# Patient Record
Sex: Female | Born: 1950 | Race: White | Hispanic: No | Marital: Married | Smoking: Former smoker
Health system: Southern US, Community
[De-identification: ages and names within clinical notes are randomized; demographics above are authoritative.]

## PROBLEM LIST (undated history)

## (undated) DIAGNOSIS — I639 Cerebral infarction, unspecified: Secondary | ICD-10-CM

## (undated) DIAGNOSIS — K219 Gastro-esophageal reflux disease without esophagitis: Secondary | ICD-10-CM

## (undated) DIAGNOSIS — I872 Venous insufficiency (chronic) (peripheral): Secondary | ICD-10-CM

## (undated) DIAGNOSIS — K5289 Other specified noninfective gastroenteritis and colitis: Secondary | ICD-10-CM

## (undated) DIAGNOSIS — J45991 Cough variant asthma: Secondary | ICD-10-CM

## (undated) DIAGNOSIS — I671 Cerebral aneurysm, nonruptured: Secondary | ICD-10-CM

## (undated) DIAGNOSIS — R0789 Other chest pain: Secondary | ICD-10-CM

## (undated) DIAGNOSIS — F32A Depression, unspecified: Secondary | ICD-10-CM

## (undated) DIAGNOSIS — S82852A Displaced trimalleolar fracture of left lower leg, initial encounter for closed fracture: Secondary | ICD-10-CM

## (undated) DIAGNOSIS — R42 Dizziness and giddiness: Secondary | ICD-10-CM

## (undated) DIAGNOSIS — K602 Anal fissure, unspecified: Secondary | ICD-10-CM

## (undated) DIAGNOSIS — K6389 Other specified diseases of intestine: Secondary | ICD-10-CM

## (undated) DIAGNOSIS — F329 Major depressive disorder, single episode, unspecified: Secondary | ICD-10-CM

## (undated) DIAGNOSIS — I10 Essential (primary) hypertension: Secondary | ICD-10-CM

## (undated) DIAGNOSIS — IMO0001 Reserved for inherently not codable concepts without codable children: Secondary | ICD-10-CM

## (undated) DIAGNOSIS — K449 Diaphragmatic hernia without obstruction or gangrene: Secondary | ICD-10-CM

## (undated) DIAGNOSIS — S9305XA Dislocation of left ankle joint, initial encounter: Secondary | ICD-10-CM

## (undated) DIAGNOSIS — I619 Nontraumatic intracerebral hemorrhage, unspecified: Secondary | ICD-10-CM

## (undated) DIAGNOSIS — K5900 Constipation, unspecified: Secondary | ICD-10-CM

## (undated) DIAGNOSIS — G4730 Sleep apnea, unspecified: Secondary | ICD-10-CM

## (undated) DIAGNOSIS — G43009 Migraine without aura, not intractable, without status migrainosus: Secondary | ICD-10-CM

## (undated) DIAGNOSIS — R209 Unspecified disturbances of skin sensation: Secondary | ICD-10-CM

## (undated) DIAGNOSIS — E785 Hyperlipidemia, unspecified: Secondary | ICD-10-CM

## (undated) DIAGNOSIS — Z87442 Personal history of urinary calculi: Secondary | ICD-10-CM

## (undated) DIAGNOSIS — M8580 Other specified disorders of bone density and structure, unspecified site: Secondary | ICD-10-CM

## (undated) DIAGNOSIS — K5090 Crohn's disease, unspecified, without complications: Secondary | ICD-10-CM

## (undated) DIAGNOSIS — M5030 Other cervical disc degeneration, unspecified cervical region: Secondary | ICD-10-CM

## (undated) DIAGNOSIS — G5600 Carpal tunnel syndrome, unspecified upper limb: Secondary | ICD-10-CM

## (undated) DIAGNOSIS — K8020 Calculus of gallbladder without cholecystitis without obstruction: Secondary | ICD-10-CM

## (undated) DIAGNOSIS — IMO0002 Reserved for concepts with insufficient information to code with codable children: Secondary | ICD-10-CM

## (undated) DIAGNOSIS — R197 Diarrhea, unspecified: Secondary | ICD-10-CM

## (undated) DIAGNOSIS — N6019 Diffuse cystic mastopathy of unspecified breast: Secondary | ICD-10-CM

## (undated) DIAGNOSIS — G4700 Insomnia, unspecified: Secondary | ICD-10-CM

## (undated) HISTORY — DX: Gastro-esophageal reflux disease without esophagitis: K21.9

## (undated) HISTORY — DX: Reserved for concepts with insufficient information to code with codable children: IMO0002

## (undated) HISTORY — DX: Other cervical disc degeneration, unspecified cervical region: M50.30

## (undated) HISTORY — DX: Dizziness and giddiness: R42

## (undated) HISTORY — DX: Other specified noninfective gastroenteritis and colitis: K52.89

## (undated) HISTORY — DX: Migraine without aura, not intractable, without status migrainosus: G43.009

## (undated) HISTORY — DX: Cerebral aneurysm, nonruptured: I67.1

## (undated) HISTORY — DX: Unspecified disturbances of skin sensation: R20.9

## (undated) HISTORY — DX: Diffuse cystic mastopathy of unspecified breast: N60.19

## (undated) HISTORY — DX: Other specified diseases of intestine: K63.89

## (undated) HISTORY — DX: Carpal tunnel syndrome, unspecified upper limb: G56.00

## (undated) HISTORY — DX: Crohn's disease, unspecified, without complications: K50.90

## (undated) HISTORY — PX: BREAST LUMPECTOMY: SHX2

## (undated) HISTORY — DX: Sleep apnea, unspecified: G47.30

## (undated) HISTORY — DX: Venous insufficiency (chronic) (peripheral): I87.2

## (undated) HISTORY — DX: Nontraumatic intracerebral hemorrhage, unspecified: I61.9

## (undated) HISTORY — DX: Hyperlipidemia, unspecified: E78.5

## (undated) HISTORY — DX: Other chest pain: R07.89

## (undated) HISTORY — DX: Cough variant asthma: J45.991

## (undated) HISTORY — PX: ABDOMINAL HYSTERECTOMY: SHX81

## (undated) HISTORY — PX: COLONOSCOPY: SHX174

## (undated) HISTORY — DX: Essential (primary) hypertension: I10

## (undated) HISTORY — DX: Diaphragmatic hernia without obstruction or gangrene: K44.9

## (undated) HISTORY — PX: CARPAL TUNNEL RELEASE: SHX101

## (undated) HISTORY — DX: Calculus of gallbladder without cholecystitis without obstruction: K80.20

## (undated) HISTORY — DX: Insomnia, unspecified: G47.00

## (undated) HISTORY — PX: TONSILLECTOMY: SUR1361

## (undated) HISTORY — DX: Constipation, unspecified: K59.00

## (undated) HISTORY — PX: EYE SURGERY: SHX253

## (undated) HISTORY — DX: Anal fissure, unspecified: K60.2

## (undated) HISTORY — DX: Diarrhea, unspecified: R19.7

---

## 1979-09-12 HISTORY — PX: COLECTOMY: SHX59

## 1998-07-20 ENCOUNTER — Ambulatory Visit (HOSPITAL_COMMUNITY): Admission: RE | Admit: 1998-07-20 | Discharge: 1998-07-20 | Payer: Self-pay | Admitting: Obstetrics and Gynecology

## 1998-12-07 ENCOUNTER — Ambulatory Visit (HOSPITAL_COMMUNITY): Admission: RE | Admit: 1998-12-07 | Discharge: 1998-12-07 | Payer: Self-pay | Admitting: Gastroenterology

## 1998-12-07 ENCOUNTER — Encounter: Payer: Self-pay | Admitting: Gastroenterology

## 1999-01-10 ENCOUNTER — Other Ambulatory Visit: Admission: RE | Admit: 1999-01-10 | Discharge: 1999-01-10 | Payer: Self-pay | Admitting: Gastroenterology

## 1999-01-12 ENCOUNTER — Other Ambulatory Visit: Admission: RE | Admit: 1999-01-12 | Discharge: 1999-01-12 | Payer: Self-pay | Admitting: Obstetrics and Gynecology

## 1999-07-04 ENCOUNTER — Other Ambulatory Visit: Admission: RE | Admit: 1999-07-04 | Discharge: 1999-07-04 | Payer: Self-pay | Admitting: Obstetrics and Gynecology

## 1999-07-19 ENCOUNTER — Encounter: Payer: Self-pay | Admitting: Obstetrics and Gynecology

## 1999-07-19 ENCOUNTER — Encounter: Admission: RE | Admit: 1999-07-19 | Discharge: 1999-07-19 | Payer: Self-pay | Admitting: Obstetrics and Gynecology

## 2000-07-12 ENCOUNTER — Other Ambulatory Visit: Admission: RE | Admit: 2000-07-12 | Discharge: 2000-07-12 | Payer: Self-pay | Admitting: Obstetrics and Gynecology

## 2000-07-19 ENCOUNTER — Encounter: Payer: Self-pay | Admitting: Obstetrics and Gynecology

## 2000-07-19 ENCOUNTER — Encounter: Admission: RE | Admit: 2000-07-19 | Discharge: 2000-07-19 | Payer: Self-pay | Admitting: Obstetrics and Gynecology

## 2001-07-15 ENCOUNTER — Other Ambulatory Visit: Admission: RE | Admit: 2001-07-15 | Discharge: 2001-07-15 | Payer: Self-pay | Admitting: Obstetrics and Gynecology

## 2001-07-30 ENCOUNTER — Encounter: Payer: Self-pay | Admitting: Obstetrics and Gynecology

## 2001-07-30 ENCOUNTER — Encounter: Admission: RE | Admit: 2001-07-30 | Discharge: 2001-07-30 | Payer: Self-pay | Admitting: Obstetrics and Gynecology

## 2001-08-01 ENCOUNTER — Encounter: Admission: RE | Admit: 2001-08-01 | Discharge: 2001-08-01 | Payer: Self-pay | Admitting: Obstetrics and Gynecology

## 2001-08-01 ENCOUNTER — Encounter: Payer: Self-pay | Admitting: Obstetrics and Gynecology

## 2002-08-27 ENCOUNTER — Encounter: Payer: Self-pay | Admitting: Obstetrics and Gynecology

## 2002-08-27 ENCOUNTER — Encounter: Admission: RE | Admit: 2002-08-27 | Discharge: 2002-08-27 | Payer: Self-pay | Admitting: Obstetrics and Gynecology

## 2003-03-25 ENCOUNTER — Ambulatory Visit (HOSPITAL_BASED_OUTPATIENT_CLINIC_OR_DEPARTMENT_OTHER): Admission: RE | Admit: 2003-03-25 | Discharge: 2003-03-25 | Payer: Self-pay | Admitting: Internal Medicine

## 2003-05-27 ENCOUNTER — Encounter: Payer: Self-pay | Admitting: Internal Medicine

## 2003-05-27 ENCOUNTER — Encounter: Payer: Self-pay | Admitting: Gastroenterology

## 2003-10-06 ENCOUNTER — Encounter: Admission: RE | Admit: 2003-10-06 | Discharge: 2003-10-06 | Payer: Self-pay | Admitting: Internal Medicine

## 2003-11-10 ENCOUNTER — Encounter: Admission: RE | Admit: 2003-11-10 | Discharge: 2003-11-10 | Payer: Self-pay | Admitting: Obstetrics and Gynecology

## 2003-11-25 ENCOUNTER — Encounter: Payer: Self-pay | Admitting: Gastroenterology

## 2003-11-25 ENCOUNTER — Encounter: Payer: Self-pay | Admitting: Internal Medicine

## 2004-01-06 ENCOUNTER — Encounter: Admission: RE | Admit: 2004-01-06 | Discharge: 2004-01-06 | Payer: Self-pay | Admitting: Obstetrics and Gynecology

## 2004-06-16 ENCOUNTER — Encounter: Admission: RE | Admit: 2004-06-16 | Discharge: 2004-06-16 | Payer: Self-pay | Admitting: Internal Medicine

## 2004-07-12 ENCOUNTER — Ambulatory Visit: Payer: Self-pay | Admitting: Gastroenterology

## 2004-08-16 ENCOUNTER — Ambulatory Visit: Payer: Self-pay | Admitting: Gastroenterology

## 2004-09-27 ENCOUNTER — Ambulatory Visit: Payer: Self-pay | Admitting: Internal Medicine

## 2004-11-10 ENCOUNTER — Encounter: Admission: RE | Admit: 2004-11-10 | Discharge: 2004-11-10 | Payer: Self-pay | Admitting: Internal Medicine

## 2004-11-15 ENCOUNTER — Ambulatory Visit: Payer: Self-pay | Admitting: Gastroenterology

## 2005-02-21 ENCOUNTER — Ambulatory Visit: Payer: Self-pay | Admitting: Gastroenterology

## 2005-03-21 ENCOUNTER — Ambulatory Visit: Payer: Self-pay | Admitting: Internal Medicine

## 2005-03-28 ENCOUNTER — Other Ambulatory Visit: Admission: RE | Admit: 2005-03-28 | Discharge: 2005-03-28 | Payer: Self-pay | Admitting: Internal Medicine

## 2005-03-28 ENCOUNTER — Ambulatory Visit: Payer: Self-pay | Admitting: Internal Medicine

## 2005-04-19 ENCOUNTER — Ambulatory Visit: Payer: Self-pay | Admitting: Internal Medicine

## 2005-05-02 ENCOUNTER — Ambulatory Visit: Payer: Self-pay | Admitting: Internal Medicine

## 2005-05-23 ENCOUNTER — Ambulatory Visit: Payer: Self-pay | Admitting: Internal Medicine

## 2005-08-21 ENCOUNTER — Ambulatory Visit: Payer: Self-pay | Admitting: Internal Medicine

## 2005-09-26 ENCOUNTER — Ambulatory Visit: Payer: Self-pay | Admitting: Gastroenterology

## 2005-11-29 ENCOUNTER — Encounter: Admission: RE | Admit: 2005-11-29 | Discharge: 2005-11-29 | Payer: Self-pay | Admitting: Internal Medicine

## 2006-04-02 ENCOUNTER — Ambulatory Visit: Payer: Self-pay | Admitting: Internal Medicine

## 2006-04-09 ENCOUNTER — Ambulatory Visit: Payer: Self-pay | Admitting: Internal Medicine

## 2006-06-12 ENCOUNTER — Ambulatory Visit: Payer: Self-pay | Admitting: Internal Medicine

## 2006-06-19 ENCOUNTER — Inpatient Hospital Stay (HOSPITAL_COMMUNITY): Admission: AD | Admit: 2006-06-19 | Discharge: 2006-06-26 | Payer: Self-pay | Admitting: Neurosurgery

## 2006-06-19 ENCOUNTER — Encounter: Payer: Self-pay | Admitting: Emergency Medicine

## 2006-06-20 ENCOUNTER — Encounter: Payer: Self-pay | Admitting: Internal Medicine

## 2006-06-20 ENCOUNTER — Encounter (INDEPENDENT_AMBULATORY_CARE_PROVIDER_SITE_OTHER): Payer: Self-pay | Admitting: *Deleted

## 2006-06-20 ENCOUNTER — Ambulatory Visit: Payer: Self-pay | Admitting: Internal Medicine

## 2006-06-21 ENCOUNTER — Ambulatory Visit: Payer: Self-pay | Admitting: Physical Medicine & Rehabilitation

## 2006-07-09 ENCOUNTER — Encounter: Admission: RE | Admit: 2006-07-09 | Discharge: 2006-08-30 | Payer: Self-pay | Admitting: Neurology

## 2006-07-10 ENCOUNTER — Ambulatory Visit: Payer: Self-pay | Admitting: Internal Medicine

## 2006-07-30 ENCOUNTER — Ambulatory Visit: Payer: Self-pay | Admitting: Internal Medicine

## 2006-08-16 ENCOUNTER — Ambulatory Visit (HOSPITAL_COMMUNITY): Admission: RE | Admit: 2006-08-16 | Discharge: 2006-08-16 | Payer: Self-pay | Admitting: Unknown Physician Specialty

## 2006-08-21 ENCOUNTER — Encounter: Payer: Self-pay | Admitting: Internal Medicine

## 2006-08-21 ENCOUNTER — Ambulatory Visit: Payer: Self-pay

## 2006-09-07 ENCOUNTER — Ambulatory Visit: Payer: Self-pay | Admitting: Internal Medicine

## 2006-10-03 ENCOUNTER — Ambulatory Visit: Payer: Self-pay | Admitting: Internal Medicine

## 2006-10-30 ENCOUNTER — Ambulatory Visit: Payer: Self-pay | Admitting: Licensed Clinical Social Worker

## 2006-11-12 ENCOUNTER — Ambulatory Visit: Payer: Self-pay | Admitting: Internal Medicine

## 2006-11-28 ENCOUNTER — Encounter: Admission: RE | Admit: 2006-11-28 | Discharge: 2006-11-28 | Payer: Self-pay | Admitting: Internal Medicine

## 2006-12-04 ENCOUNTER — Ambulatory Visit: Payer: Self-pay | Admitting: Gastroenterology

## 2006-12-04 LAB — CONVERTED CEMR LAB
ALT: 32 units/L (ref 0–40)
BUN: 14 mg/dL (ref 6–23)
Basophils Absolute: 0 10*3/uL (ref 0.0–0.1)
Bilirubin, Direct: 0.1 mg/dL (ref 0.0–0.3)
CO2: 28 meq/L (ref 19–32)
Calcium: 9.6 mg/dL (ref 8.4–10.5)
Chloride: 110 meq/L (ref 96–112)
GFR calc non Af Amer: 69 mL/min
Glucose, Bld: 92 mg/dL (ref 70–99)
HCT: 44.2 % (ref 36.0–46.0)
Hemoglobin: 14.4 g/dL (ref 12.0–15.0)
Lymphocytes Relative: 43.8 % (ref 12.0–46.0)
MCHC: 32.6 g/dL (ref 30.0–36.0)
Monocytes Absolute: 0.6 10*3/uL (ref 0.2–0.7)
Monocytes Relative: 10.7 % (ref 3.0–11.0)
Neutro Abs: 2.5 10*3/uL (ref 1.4–7.7)
Neutrophils Relative %: 43.7 % (ref 43.0–77.0)
Potassium: 4.1 meq/L (ref 3.5–5.1)
Sed Rate: 10 mm/hr (ref 0–25)
Total Protein: 6.9 g/dL (ref 6.0–8.3)
Vitamin B-12: 470 pg/mL (ref 211–911)

## 2006-12-05 ENCOUNTER — Encounter: Payer: Self-pay | Admitting: Gastroenterology

## 2006-12-05 ENCOUNTER — Ambulatory Visit: Payer: Self-pay | Admitting: Internal Medicine

## 2006-12-13 ENCOUNTER — Ambulatory Visit: Payer: Self-pay | Admitting: Gastroenterology

## 2007-01-22 ENCOUNTER — Encounter: Admission: RE | Admit: 2007-01-22 | Discharge: 2007-01-22 | Payer: Self-pay | Admitting: Internal Medicine

## 2007-02-12 ENCOUNTER — Ambulatory Visit: Payer: Self-pay | Admitting: Internal Medicine

## 2007-02-13 ENCOUNTER — Encounter: Payer: Self-pay | Admitting: Internal Medicine

## 2007-02-19 ENCOUNTER — Ambulatory Visit: Payer: Self-pay

## 2007-03-11 ENCOUNTER — Telehealth (INDEPENDENT_AMBULATORY_CARE_PROVIDER_SITE_OTHER): Payer: Self-pay | Admitting: *Deleted

## 2007-03-19 ENCOUNTER — Ambulatory Visit: Payer: Self-pay | Admitting: Internal Medicine

## 2007-04-03 ENCOUNTER — Encounter: Payer: Self-pay | Admitting: Internal Medicine

## 2007-04-04 ENCOUNTER — Telehealth: Payer: Self-pay | Admitting: Internal Medicine

## 2007-04-05 ENCOUNTER — Ambulatory Visit: Payer: Self-pay | Admitting: Internal Medicine

## 2007-04-05 DIAGNOSIS — K219 Gastro-esophageal reflux disease without esophagitis: Secondary | ICD-10-CM | POA: Insufficient documentation

## 2007-04-05 DIAGNOSIS — I10 Essential (primary) hypertension: Secondary | ICD-10-CM | POA: Insufficient documentation

## 2007-04-05 DIAGNOSIS — D869 Sarcoidosis, unspecified: Secondary | ICD-10-CM | POA: Insufficient documentation

## 2007-04-05 DIAGNOSIS — G43909 Migraine, unspecified, not intractable, without status migrainosus: Secondary | ICD-10-CM | POA: Insufficient documentation

## 2007-04-05 DIAGNOSIS — G5600 Carpal tunnel syndrome, unspecified upper limb: Secondary | ICD-10-CM | POA: Insufficient documentation

## 2007-04-05 DIAGNOSIS — K5090 Crohn's disease, unspecified, without complications: Secondary | ICD-10-CM | POA: Insufficient documentation

## 2007-04-05 DIAGNOSIS — K8020 Calculus of gallbladder without cholecystitis without obstruction: Secondary | ICD-10-CM | POA: Insufficient documentation

## 2007-04-05 DIAGNOSIS — G4730 Sleep apnea, unspecified: Secondary | ICD-10-CM | POA: Insufficient documentation

## 2007-04-18 ENCOUNTER — Telehealth: Payer: Self-pay | Admitting: *Deleted

## 2007-04-19 ENCOUNTER — Telehealth: Payer: Self-pay | Admitting: Internal Medicine

## 2007-05-02 ENCOUNTER — Ambulatory Visit: Payer: Self-pay | Admitting: Internal Medicine

## 2007-05-30 ENCOUNTER — Emergency Department (HOSPITAL_COMMUNITY): Admission: EM | Admit: 2007-05-30 | Discharge: 2007-05-31 | Payer: Self-pay | Admitting: Emergency Medicine

## 2007-06-04 ENCOUNTER — Ambulatory Visit: Payer: Self-pay | Admitting: Internal Medicine

## 2007-07-08 ENCOUNTER — Ambulatory Visit: Payer: Self-pay | Admitting: Internal Medicine

## 2007-07-23 ENCOUNTER — Telehealth: Payer: Self-pay | Admitting: Internal Medicine

## 2007-09-24 ENCOUNTER — Ambulatory Visit: Payer: Self-pay | Admitting: Internal Medicine

## 2007-09-24 LAB — CONVERTED CEMR LAB
ALT: 22 units/L (ref 0–35)
AST: 22 units/L (ref 0–37)
Basophils Relative: 0.4 % (ref 0.0–1.0)
Bilirubin Urine: NEGATIVE
Bilirubin, Direct: 0.1 mg/dL (ref 0.0–0.3)
Blood in Urine, dipstick: NEGATIVE
Calcium: 9.5 mg/dL (ref 8.4–10.5)
Chloride: 108 meq/L (ref 96–112)
Creatinine, Ser: 0.9 mg/dL (ref 0.4–1.2)
Eosinophils Absolute: 0.1 10*3/uL (ref 0.0–0.6)
Eosinophils Relative: 1.8 % (ref 0.0–5.0)
GFR calc non Af Amer: 68 mL/min
Glucose, Bld: 84 mg/dL (ref 70–99)
HCT: 42.2 % (ref 36.0–46.0)
Ketones, urine, test strip: NEGATIVE
LDL Cholesterol: 120 mg/dL — ABNORMAL HIGH (ref 0–99)
MCV: 90.2 fL (ref 78.0–100.0)
Neutrophils Relative %: 46 % (ref 43.0–77.0)
Platelets: 212 10*3/uL (ref 150–400)
RBC: 4.68 M/uL (ref 3.87–5.11)
RDW: 12 % (ref 11.5–14.6)
Sodium: 143 meq/L (ref 135–145)
Specific Gravity, Urine: 1.02
TSH: 1.4 microintl units/mL (ref 0.35–5.50)
Total Bilirubin: 0.8 mg/dL (ref 0.3–1.2)
Total CHOL/HDL Ratio: 3.8
Triglycerides: 107 mg/dL (ref 0–149)
VLDL: 21 mg/dL (ref 0–40)
WBC Urine, dipstick: NEGATIVE
WBC: 5.1 10*3/uL (ref 4.5–10.5)

## 2007-10-01 ENCOUNTER — Other Ambulatory Visit: Admission: RE | Admit: 2007-10-01 | Discharge: 2007-10-01 | Payer: Self-pay | Admitting: Neurosurgery

## 2007-10-01 ENCOUNTER — Encounter: Payer: Self-pay | Admitting: Internal Medicine

## 2007-10-01 ENCOUNTER — Ambulatory Visit: Payer: Self-pay | Admitting: Internal Medicine

## 2007-10-01 DIAGNOSIS — N6019 Diffuse cystic mastopathy of unspecified breast: Secondary | ICD-10-CM

## 2007-10-01 HISTORY — DX: Diffuse cystic mastopathy of unspecified breast: N60.19

## 2007-10-03 ENCOUNTER — Telehealth: Payer: Self-pay | Admitting: Internal Medicine

## 2007-10-08 ENCOUNTER — Encounter: Admission: RE | Admit: 2007-10-08 | Discharge: 2007-10-08 | Payer: Self-pay | Admitting: Internal Medicine

## 2007-10-15 ENCOUNTER — Encounter: Payer: Self-pay | Admitting: Internal Medicine

## 2007-11-04 ENCOUNTER — Ambulatory Visit: Payer: Self-pay | Admitting: Internal Medicine

## 2007-11-04 DIAGNOSIS — R209 Unspecified disturbances of skin sensation: Secondary | ICD-10-CM | POA: Insufficient documentation

## 2007-11-04 DIAGNOSIS — I872 Venous insufficiency (chronic) (peripheral): Secondary | ICD-10-CM | POA: Insufficient documentation

## 2007-11-05 ENCOUNTER — Ambulatory Visit: Payer: Self-pay | Admitting: Gastroenterology

## 2007-11-05 LAB — CONVERTED CEMR LAB
Albumin: 3.7 g/dL (ref 3.5–5.2)
Basophils Absolute: 0.1 10*3/uL (ref 0.0–0.1)
Bilirubin, Direct: 0.1 mg/dL (ref 0.0–0.3)
Folate: 8 ng/mL
GFR calc Af Amer: 83 mL/min
GFR calc non Af Amer: 68 mL/min
Glucose, Bld: 90 mg/dL (ref 70–99)
Hemoglobin: 14.3 g/dL (ref 12.0–15.0)
Iron: 92 ug/dL (ref 42–145)
Lymphocytes Relative: 40 % (ref 12.0–46.0)
MCHC: 34.2 g/dL (ref 30.0–36.0)
Monocytes Absolute: 0.5 10*3/uL (ref 0.2–0.7)
Monocytes Relative: 8.9 % (ref 3.0–11.0)
Neutro Abs: 3 10*3/uL (ref 1.4–7.7)
Platelets: 212 10*3/uL (ref 150–400)
RDW: 11.8 % (ref 11.5–14.6)
Saturation Ratios: 23.4 % (ref 20.0–50.0)
Sed Rate: 10 mm/hr (ref 0–25)
Sodium: 139 meq/L (ref 135–145)
Transferrin: 280.9 mg/dL (ref >=212.0)
Vitamin B-12: 443 pg/mL (ref 211–911)

## 2007-11-08 ENCOUNTER — Ambulatory Visit: Payer: Self-pay | Admitting: Internal Medicine

## 2007-11-18 ENCOUNTER — Encounter: Admission: RE | Admit: 2007-11-18 | Discharge: 2007-11-18 | Payer: Self-pay | Admitting: Internal Medicine

## 2007-11-28 ENCOUNTER — Ambulatory Visit: Payer: Self-pay | Admitting: Internal Medicine

## 2007-11-28 DIAGNOSIS — G4700 Insomnia, unspecified: Secondary | ICD-10-CM | POA: Insufficient documentation

## 2007-12-02 ENCOUNTER — Telehealth: Payer: Self-pay | Admitting: Internal Medicine

## 2007-12-09 ENCOUNTER — Ambulatory Visit: Payer: Self-pay | Admitting: Internal Medicine

## 2007-12-09 ENCOUNTER — Telehealth: Payer: Self-pay | Admitting: Internal Medicine

## 2007-12-09 DIAGNOSIS — J209 Acute bronchitis, unspecified: Secondary | ICD-10-CM | POA: Insufficient documentation

## 2007-12-13 ENCOUNTER — Telehealth: Payer: Self-pay | Admitting: Internal Medicine

## 2007-12-25 ENCOUNTER — Telehealth: Payer: Self-pay | Admitting: Internal Medicine

## 2008-01-06 ENCOUNTER — Telehealth: Payer: Self-pay | Admitting: Internal Medicine

## 2008-01-09 ENCOUNTER — Encounter: Payer: Self-pay | Admitting: Internal Medicine

## 2008-02-24 ENCOUNTER — Encounter: Payer: Self-pay | Admitting: Internal Medicine

## 2008-02-27 ENCOUNTER — Encounter: Admission: RE | Admit: 2008-02-27 | Discharge: 2008-03-30 | Payer: Self-pay | Admitting: Neurology

## 2008-03-02 ENCOUNTER — Telehealth: Payer: Self-pay | Admitting: *Deleted

## 2008-03-02 ENCOUNTER — Ambulatory Visit: Payer: Self-pay | Admitting: Internal Medicine

## 2008-03-30 ENCOUNTER — Encounter: Payer: Self-pay | Admitting: Internal Medicine

## 2008-04-09 ENCOUNTER — Encounter: Payer: Self-pay | Admitting: Internal Medicine

## 2008-04-21 ENCOUNTER — Telehealth (INDEPENDENT_AMBULATORY_CARE_PROVIDER_SITE_OTHER): Payer: Self-pay | Admitting: *Deleted

## 2008-04-29 DIAGNOSIS — R197 Diarrhea, unspecified: Secondary | ICD-10-CM | POA: Insufficient documentation

## 2008-04-29 DIAGNOSIS — K602 Anal fissure, unspecified: Secondary | ICD-10-CM | POA: Insufficient documentation

## 2008-04-29 DIAGNOSIS — I671 Cerebral aneurysm, nonruptured: Secondary | ICD-10-CM | POA: Insufficient documentation

## 2008-04-29 DIAGNOSIS — K625 Hemorrhage of anus and rectum: Secondary | ICD-10-CM | POA: Insufficient documentation

## 2008-04-29 DIAGNOSIS — K449 Diaphragmatic hernia without obstruction or gangrene: Secondary | ICD-10-CM | POA: Insufficient documentation

## 2008-04-29 DIAGNOSIS — K5900 Constipation, unspecified: Secondary | ICD-10-CM | POA: Insufficient documentation

## 2008-04-29 HISTORY — DX: Anal fissure, unspecified: K60.2

## 2008-04-30 ENCOUNTER — Ambulatory Visit: Payer: Self-pay | Admitting: Gastroenterology

## 2008-05-05 ENCOUNTER — Ambulatory Visit: Payer: Self-pay | Admitting: Internal Medicine

## 2008-05-05 DIAGNOSIS — M5030 Other cervical disc degeneration, unspecified cervical region: Secondary | ICD-10-CM | POA: Insufficient documentation

## 2008-05-08 ENCOUNTER — Ambulatory Visit: Payer: Self-pay | Admitting: Cardiology

## 2008-05-08 ENCOUNTER — Ambulatory Visit: Payer: Self-pay | Admitting: Internal Medicine

## 2008-05-08 ENCOUNTER — Observation Stay (HOSPITAL_COMMUNITY): Admission: EM | Admit: 2008-05-08 | Discharge: 2008-05-11 | Payer: Self-pay | Admitting: Emergency Medicine

## 2008-05-11 ENCOUNTER — Encounter: Payer: Self-pay | Admitting: Internal Medicine

## 2008-05-12 ENCOUNTER — Ambulatory Visit: Payer: Self-pay | Admitting: Internal Medicine

## 2008-05-13 ENCOUNTER — Telehealth: Payer: Self-pay | Admitting: Internal Medicine

## 2008-05-14 ENCOUNTER — Encounter: Payer: Self-pay | Admitting: Internal Medicine

## 2008-05-14 ENCOUNTER — Ambulatory Visit: Payer: Self-pay | Admitting: Internal Medicine

## 2008-05-14 ENCOUNTER — Ambulatory Visit: Payer: Self-pay

## 2008-05-14 DIAGNOSIS — M79609 Pain in unspecified limb: Secondary | ICD-10-CM | POA: Insufficient documentation

## 2008-05-21 ENCOUNTER — Encounter: Payer: Self-pay | Admitting: Internal Medicine

## 2008-06-02 ENCOUNTER — Ambulatory Visit (HOSPITAL_BASED_OUTPATIENT_CLINIC_OR_DEPARTMENT_OTHER): Admission: RE | Admit: 2008-06-02 | Discharge: 2008-06-02 | Payer: Self-pay | Admitting: Internal Medicine

## 2008-06-02 ENCOUNTER — Encounter: Payer: Self-pay | Admitting: Internal Medicine

## 2008-06-16 ENCOUNTER — Ambulatory Visit: Payer: Self-pay | Admitting: Pulmonary Disease

## 2008-06-22 ENCOUNTER — Telehealth: Payer: Self-pay | Admitting: Internal Medicine

## 2008-06-26 ENCOUNTER — Telehealth: Payer: Self-pay | Admitting: Internal Medicine

## 2008-06-30 ENCOUNTER — Telehealth: Payer: Self-pay | Admitting: Gastroenterology

## 2008-07-08 ENCOUNTER — Ambulatory Visit: Payer: Self-pay | Admitting: Internal Medicine

## 2008-07-10 ENCOUNTER — Telehealth: Payer: Self-pay | Admitting: Internal Medicine

## 2008-07-21 ENCOUNTER — Telehealth: Payer: Self-pay | Admitting: Internal Medicine

## 2008-07-23 ENCOUNTER — Ambulatory Visit: Payer: Self-pay | Admitting: Internal Medicine

## 2008-07-23 ENCOUNTER — Telehealth: Payer: Self-pay | Admitting: Internal Medicine

## 2008-07-23 DIAGNOSIS — R059 Cough, unspecified: Secondary | ICD-10-CM | POA: Insufficient documentation

## 2008-07-23 DIAGNOSIS — R05 Cough: Secondary | ICD-10-CM

## 2008-07-24 ENCOUNTER — Telehealth: Payer: Self-pay | Admitting: Internal Medicine

## 2008-07-30 ENCOUNTER — Telehealth: Payer: Self-pay | Admitting: Internal Medicine

## 2008-07-30 ENCOUNTER — Ambulatory Visit: Payer: Self-pay | Admitting: Internal Medicine

## 2008-07-30 DIAGNOSIS — J329 Chronic sinusitis, unspecified: Secondary | ICD-10-CM | POA: Insufficient documentation

## 2008-08-10 ENCOUNTER — Ambulatory Visit: Payer: Self-pay | Admitting: Gastroenterology

## 2008-08-17 ENCOUNTER — Encounter: Payer: Self-pay | Admitting: Gastroenterology

## 2008-08-17 ENCOUNTER — Ambulatory Visit: Payer: Self-pay | Admitting: Gastroenterology

## 2008-08-17 LAB — HM COLONOSCOPY

## 2008-08-18 ENCOUNTER — Encounter: Payer: Self-pay | Admitting: Gastroenterology

## 2008-09-01 ENCOUNTER — Telehealth: Payer: Self-pay | Admitting: Internal Medicine

## 2008-09-21 ENCOUNTER — Ambulatory Visit: Payer: Self-pay | Admitting: Internal Medicine

## 2008-09-21 DIAGNOSIS — M5416 Radiculopathy, lumbar region: Secondary | ICD-10-CM | POA: Insufficient documentation

## 2008-09-21 DIAGNOSIS — IMO0002 Reserved for concepts with insufficient information to code with codable children: Secondary | ICD-10-CM

## 2008-09-21 HISTORY — DX: Reserved for concepts with insufficient information to code with codable children: IMO0002

## 2008-09-21 LAB — CONVERTED CEMR LAB
BUN: 16 mg/dL (ref 6–23)
Calcium: 9.5 mg/dL (ref 8.4–10.5)
Creatinine, Ser: 0.7 mg/dL (ref 0.4–1.2)
GFR calc Af Amer: 110 mL/min
Glucose, Bld: 104 mg/dL — ABNORMAL HIGH (ref 70–99)

## 2008-09-25 ENCOUNTER — Ambulatory Visit: Payer: Self-pay | Admitting: Internal Medicine

## 2008-09-29 ENCOUNTER — Telehealth: Payer: Self-pay | Admitting: Internal Medicine

## 2008-10-06 ENCOUNTER — Ambulatory Visit: Payer: Self-pay | Admitting: Internal Medicine

## 2008-10-06 DIAGNOSIS — R0789 Other chest pain: Secondary | ICD-10-CM | POA: Insufficient documentation

## 2008-10-15 ENCOUNTER — Encounter: Admission: RE | Admit: 2008-10-15 | Discharge: 2008-10-15 | Payer: Self-pay | Admitting: Internal Medicine

## 2008-10-21 ENCOUNTER — Telehealth: Payer: Self-pay | Admitting: Internal Medicine

## 2008-10-26 ENCOUNTER — Encounter: Payer: Self-pay | Admitting: Internal Medicine

## 2008-11-11 ENCOUNTER — Encounter: Payer: Self-pay | Admitting: Internal Medicine

## 2008-11-11 ENCOUNTER — Telehealth: Payer: Self-pay | Admitting: Internal Medicine

## 2008-11-23 ENCOUNTER — Ambulatory Visit: Payer: Self-pay | Admitting: Internal Medicine

## 2008-11-23 LAB — CONVERTED CEMR LAB
AST: 22 units/L (ref 0–37)
Alkaline Phosphatase: 58 units/L (ref 39–117)
Bilirubin, Direct: 0.1 mg/dL (ref 0.0–0.3)
HDL: 55 mg/dL (ref >39.0)
Total Bilirubin: 0.7 mg/dL (ref 0.3–1.2)
Total CHOL/HDL Ratio: 3.5

## 2008-11-30 ENCOUNTER — Ambulatory Visit: Payer: Self-pay | Admitting: Internal Medicine

## 2008-12-15 ENCOUNTER — Encounter: Admission: RE | Admit: 2008-12-15 | Discharge: 2008-12-15 | Payer: Self-pay | Admitting: Internal Medicine

## 2009-01-06 ENCOUNTER — Telehealth (INDEPENDENT_AMBULATORY_CARE_PROVIDER_SITE_OTHER): Payer: Self-pay | Admitting: *Deleted

## 2009-02-25 ENCOUNTER — Telehealth: Payer: Self-pay | Admitting: Internal Medicine

## 2009-04-05 ENCOUNTER — Ambulatory Visit: Payer: Self-pay | Admitting: Internal Medicine

## 2009-04-05 ENCOUNTER — Other Ambulatory Visit: Admission: RE | Admit: 2009-04-05 | Discharge: 2009-04-05 | Payer: Self-pay | Admitting: Internal Medicine

## 2009-04-05 ENCOUNTER — Encounter: Payer: Self-pay | Admitting: Internal Medicine

## 2009-04-07 LAB — CONVERTED CEMR LAB
Albumin: 3.9 g/dL (ref 3.5–5.2)
Basophils Absolute: 0 10*3/uL (ref 0.0–0.1)
CO2: 30 meq/L (ref 19–32)
Calcium: 9.5 mg/dL (ref 8.4–10.5)
Cholesterol: 216 mg/dL — ABNORMAL HIGH (ref 0–200)
Eosinophils Absolute: 0.1 10*3/uL (ref 0.0–0.7)
Glucose, Bld: 85 mg/dL (ref 70–99)
HCT: 43.7 % (ref 36.0–46.0)
HDL: 57.6 mg/dL (ref >39.00)
Hemoglobin: 14.5 g/dL (ref 12.0–15.0)
Lymphocytes Relative: 36.3 % (ref 12.0–46.0)
Lymphs Abs: 1.7 10*3/uL (ref 0.7–4.0)
MCHC: 33.1 g/dL (ref 30.0–36.0)
Neutro Abs: 2.3 10*3/uL (ref 1.4–7.7)
RDW: 12.6 % (ref 11.5–14.6)
Sodium: 142 meq/L (ref 135–145)
TSH: 1.72 microintl units/mL (ref 0.35–5.50)
Triglycerides: 135 mg/dL (ref 0.0–149.0)

## 2009-06-23 ENCOUNTER — Encounter: Payer: Self-pay | Admitting: Internal Medicine

## 2009-07-15 ENCOUNTER — Telehealth: Payer: Self-pay | Admitting: Internal Medicine

## 2009-08-24 ENCOUNTER — Telehealth: Payer: Self-pay | Admitting: Internal Medicine

## 2009-09-15 ENCOUNTER — Ambulatory Visit: Payer: Self-pay | Admitting: Internal Medicine

## 2009-09-15 DIAGNOSIS — J45991 Cough variant asthma: Secondary | ICD-10-CM | POA: Insufficient documentation

## 2009-10-26 ENCOUNTER — Telehealth: Payer: Self-pay | Admitting: Internal Medicine

## 2009-10-26 DIAGNOSIS — R42 Dizziness and giddiness: Secondary | ICD-10-CM | POA: Insufficient documentation

## 2009-11-12 ENCOUNTER — Telehealth: Payer: Self-pay | Admitting: Internal Medicine

## 2009-11-29 ENCOUNTER — Encounter: Payer: Self-pay | Admitting: Internal Medicine

## 2009-12-24 ENCOUNTER — Encounter: Admission: RE | Admit: 2009-12-24 | Discharge: 2009-12-24 | Payer: Self-pay | Admitting: Neurosurgery

## 2010-01-12 ENCOUNTER — Encounter: Payer: Self-pay | Admitting: Internal Medicine

## 2010-02-02 ENCOUNTER — Telehealth: Payer: Self-pay | Admitting: Internal Medicine

## 2010-03-21 ENCOUNTER — Ambulatory Visit: Payer: Self-pay | Admitting: Internal Medicine

## 2010-05-02 ENCOUNTER — Telehealth: Payer: Self-pay | Admitting: Internal Medicine

## 2010-05-23 ENCOUNTER — Ambulatory Visit: Payer: Self-pay | Admitting: Vascular Surgery

## 2010-05-23 ENCOUNTER — Observation Stay (HOSPITAL_COMMUNITY): Admission: EM | Admit: 2010-05-23 | Discharge: 2010-05-23 | Payer: Self-pay | Admitting: Emergency Medicine

## 2010-05-23 ENCOUNTER — Encounter (INDEPENDENT_AMBULATORY_CARE_PROVIDER_SITE_OTHER): Payer: Self-pay | Admitting: Emergency Medicine

## 2010-06-22 ENCOUNTER — Ambulatory Visit: Payer: Self-pay | Admitting: Internal Medicine

## 2010-06-22 LAB — CONVERTED CEMR LAB
ALT: 17 units/L (ref 0–35)
AST: 22 units/L (ref 0–37)
Alkaline Phosphatase: 71 units/L (ref 39–117)
BUN: 14 mg/dL (ref 6–23)
Bilirubin, Direct: 0.1 mg/dL (ref 0.0–0.3)
Blood in Urine, dipstick: NEGATIVE
Calcium: 9.1 mg/dL (ref 8.4–10.5)
Eosinophils Relative: 3.3 % (ref 0.0–5.0)
GFR calc non Af Amer: 72.23 mL/min (ref >60)
HCT: 42.1 % (ref 36.0–46.0)
HDL: 54.5 mg/dL (ref >39.00)
Hemoglobin: 14.4 g/dL (ref 12.0–15.0)
Lymphocytes Relative: 37.4 % (ref 12.0–46.0)
Lymphs Abs: 1.6 10*3/uL (ref 0.7–4.0)
Monocytes Relative: 10.5 % (ref 3.0–12.0)
Nitrite: NEGATIVE
Platelets: 197 10*3/uL (ref 150.0–400.0)
Potassium: 4.1 meq/L (ref 3.5–5.1)
Protein, U semiquant: NEGATIVE
Sodium: 141 meq/L (ref 135–145)
TSH: 1.75 microintl units/mL (ref 0.35–5.50)
Total Bilirubin: 0.6 mg/dL (ref 0.3–1.2)
VLDL: 23.2 mg/dL (ref 0.0–40.0)
WBC Urine, dipstick: NEGATIVE
WBC: 4.3 10*3/uL — ABNORMAL LOW (ref 4.5–10.5)

## 2010-06-29 ENCOUNTER — Ambulatory Visit: Payer: Self-pay | Admitting: Internal Medicine

## 2010-06-29 DIAGNOSIS — E785 Hyperlipidemia, unspecified: Secondary | ICD-10-CM | POA: Insufficient documentation

## 2010-09-27 ENCOUNTER — Ambulatory Visit
Admission: RE | Admit: 2010-09-27 | Discharge: 2010-09-27 | Payer: Self-pay | Source: Home / Self Care | Attending: Internal Medicine | Admitting: Internal Medicine

## 2010-09-27 ENCOUNTER — Other Ambulatory Visit: Payer: Self-pay | Admitting: Internal Medicine

## 2010-09-27 ENCOUNTER — Encounter
Admission: RE | Admit: 2010-09-27 | Discharge: 2010-09-27 | Payer: Self-pay | Source: Home / Self Care | Attending: Internal Medicine | Admitting: Internal Medicine

## 2010-09-27 LAB — LIPID PANEL
Cholesterol: 185 mg/dL (ref 0–200)
HDL: 70.6 mg/dL (ref >39.00)
LDL Cholesterol: 92 mg/dL (ref 0–99)
Total CHOL/HDL Ratio: 3
Triglycerides: 113 mg/dL (ref 0.0–149.0)
VLDL: 22.6 mg/dL (ref 0.0–40.0)

## 2010-09-27 LAB — HEPATIC FUNCTION PANEL
ALT: 18 U/L (ref 0–35)
AST: 17 U/L (ref 0–37)
Albumin: 3.9 g/dL (ref 3.5–5.2)
Alkaline Phosphatase: 70 U/L (ref 39–117)
Bilirubin, Direct: 0.1 mg/dL (ref 0.0–0.3)
Total Bilirubin: 0.3 mg/dL (ref 0.3–1.2)
Total Protein: 7 g/dL (ref 6.0–8.3)

## 2010-10-01 ENCOUNTER — Encounter: Payer: Self-pay | Admitting: Obstetrics and Gynecology

## 2010-10-02 ENCOUNTER — Encounter: Payer: Self-pay | Admitting: Internal Medicine

## 2010-10-04 ENCOUNTER — Ambulatory Visit
Admission: RE | Admit: 2010-10-04 | Discharge: 2010-10-04 | Payer: Self-pay | Source: Home / Self Care | Attending: Internal Medicine | Admitting: Internal Medicine

## 2010-10-05 ENCOUNTER — Encounter: Admission: RE | Admit: 2010-10-05 | Discharge: 2010-10-05 | Payer: Self-pay | Source: Home / Self Care

## 2010-10-11 NOTE — Assessment & Plan Note (Signed)
Summary: 4 month rov/njr/PT RESCD//CCM PT RSC/NJR   Vital Signs:  Patient profile:   60 year old female Height:      61 inches Weight:      148 pounds BMI:     28.07 Temp:     98.2 degrees F oral Pulse rate:   72 / minute Resp:     14 per minute BP sitting:   132 / 80  (left arm)  Vitals Entered By: Allyne Gee, LPN (September 15, 1608 10:14 AM)  Nutrition Counseling: Patient's BMI is greater than 25 and therefore counseled on weight management options. CC: roa, Cough, Hypertension Management, Headache   Primary Care Provider:  Benay Pillow, M.D.  CC:  roa, Cough, Hypertension Management, and Headache.  History of Present Illness: worse in the evening and in the AM upon awakening  Cough      This is a 60 year old woman who presents with Cough.  The patient reports non-productive cough and wheezing, but denies productive cough, pleuritic chest pain, shortness of breath, exertional dyspnea, fever, hemoptysis, and malaise.  Associated symtpoms include acid reflux symptoms.  The cough is worse with activity.  Ineffective prior treatments have included H2-blockers and PPI.  Risk factors include history of asthma and history of reflux.    Headache HPI:      The patient comes in for chronic management of stable headaches.  The patient is right handed.        The patient denies first or worst H/A of life, change in frequency from prior H/A's, change in severity from prior H/A's, change in features from prior H/A's, new onset H/A's in middle-age or later, new or progressive H/A lasting days, H/A's with Valsalva (cough/sneeze), mylagia, fever, malaise, weight loss, scalp tenderness, jaw claudication, focal neurologic symptoms, confusion, seizures, and impaired level of consciousness.         Hypertension History:      Positive major cardiovascular risk factors include female age 28 years old or older and hypertension.  Negative major cardiovascular risk factors include negative family  history for ischemic heart disease and non-tobacco-user status.        Positive history for target organ damage include prior stroke (or TIA).      Preventive Screening-Counseling & Management  Alcohol-Tobacco     Smoking Status: never     Passive Smoke Exposure: no  Problems Prior to Update: 1)  Personality Disorder, Unspec.  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 1/2 By Mouth Daily As Needed Sleep 2)  Innopran Xl 80 Mg  Cp24 (Propranolol Hcl Sr Beads) .Marland Kitchen.. 1 Capsule Po Once Daily 3)  Adult Aspirin Ec Low Strength 81 Mg  Tbec (Aspirin) .Marland Kitchen.. 1 Tablet Po Once Daily 4)  Fioricet 50-325-40 Mg  Tabs (Butalbital-Apap-Caffeine) .... As Needed Headache 5)  Nexium 40 Mg Cpdr (Esomeprazole Magnesium) .Marland Kitchen.. 1 Once Daily 6)  Norvasc 2.5 Mg Tabs (Amlodipine Besylate) .Marland Kitchen.. 1 Once Daily 7)  Clonidine Hcl 0.1 Mg Tabs (Clonidine Hcl) ....  1/2 Tab By Mouth As Needed For Symptomatic  Bp Greater Than 170 8)  Multivitamins  Caps (Multiple Vitamin) .Marland Kitchen.. 1 Once Daily 9)  Flaxseed Oil 1000 Mg Caps (Flaxseed (Linseed)) .Marland Kitchen.. 1 Once Daily 10)  Allegra 180 Mg Tabs (Fexofenadine Hcl) .... One By Mouth Daily 11)  Atuss Ds 30-4-30 Mg/38m Susp (Pseudoephed Hcl-Cpm-Dm Hbr Tan) .... As Directed  Current Medications (verified): 1)  Halcion 0.25 Mg  Tabs (Triazolam) .... 1/2 By Mouth Daily As Needed Sleep 2)  Innopran Xl 80 Mg  Cp24 (Propranolol Hcl Sr Beads) ..Marland Kitchen. 1 Capsule Po Once Daily 3)  Adult Aspirin Ec Low Strength 81 Mg  Tbec (Aspirin) ..Marland Kitchen. 1 Tablet Po Once Daily 4)  Fioricet 50-325-40 Mg  Tabs (Butalbital-Apap-Caffeine) .... As Needed Headache 5)  Nexium 40 Mg Cpdr (Esomeprazole Magnesium) ..Marland Kitchen. 1 Once Daily 6)  Norvasc 2.5 Mg Tabs (Amlodipine Besylate) ..Marland Kitchen. 1 Once Daily 7)  Clonidine Hcl 0.1 Mg Tabs (Clonidine Hcl) .... 1/2 Tab By Mouth As Needed For Symptomatic  Bp Greater Than 170 8)  Multivitamins  Caps (Multiple Vitamin) ..Marland Kitchen. 1 Once Daily 9)  Flaxseed Oil 1000 Mg Caps (Flaxseed (Linseed)) ..Marland Kitchen. 1 Once Daily 10)  Allegra 180 Mg Tabs (Fexofenadine Hcl) .... One By Mouth Daily As Needed 11)  Atuss Ds 30-4-30 Mg/562mSusp (Pseudoephed Hcl-Cpm-Dm Hbr Tan) .... As Directed  Allergies (verified): 1)  Demerol (Meperidine Hcl) 2)  Penicillin G Potassium (Penicillin G Potassium)  Past History:  Family History: Last updated: 04/30/2008 n/a Family History of Esophageal Cancer:father Family History of Stomach Cancer:father  Social History: Last updated: 04/05/2007 Retired Married Never Smoked Alcohol use-yes  Risk Factors: Smoking Status: never (09/15/2009) Passive Smoke Exposure: no (09/15/2009)  Past medical, surgical, family and social histories (including risk factors) reviewed, and no changes noted (except as noted below).  Past Medical History: Reviewed history from 04/05/2007 and no changes required. Hypertension hx of  aneurysm in brain 1971 hx of right intracranial hemorrage crohns hx of hemicolectomy 1981 sleep apnea GERD  Past Surgical History: Reviewed history from 04/29/2008 and no changes required. breast lumectomy Colectomy 1981 right Carpal tunnel release Hysterectomy 1983 Tonsillectomy  Family History: Reviewed history from 04/30/2008 and no changes required. n/a Family History of Esophageal Cancer:father Family History of Stomach Cancer:father  Social History: Reviewed history from 04/05/2007 and no changes required. Retired Married Never Smoked Alcohol use-yes  Review of Systems  The patient denies anorexia, fever, weight loss, weight gain, vision loss, decreased hearing, hoarseness, chest pain, syncope, dyspnea on exertion, peripheral edema, prolonged cough, headaches, hemoptysis, abdominal pain, melena, hematochezia, severe indigestion/heartburn, hematuria, incontinence, genital sores, muscle weakness, suspicious skin lesions, transient blindness, difficulty walking, depression, unusual weight change, abnormal bleeding, enlarged lymph nodes, angioedema, and breast masses.    Physical Exam  General:  Well-developed,well-nourished,in no acute distress; alert,appropriate and cooperative throughout examination Head:  normocephalic and atraumatic.   Eyes:  pupils equal and pupils round.  Ears:  R ear normal and L ear normal.   Mouth:  posterior lymphoid hypertrophy and postnasal drip.   Neck:  No deformities, masses, or tenderness noted. Lungs:  Normal respiratory effort, chest expands symmetrically. Lungs are clear to auscultation, no crackles or wheezes. Heart:  Normal rate and regular rhythm. S1 and S2 normal without gallop, murmur, click, rub or other extra sounds. Abdomen:  Soft, nontender and nondistended. No masses, hepatosplenomegaly or hernias noted. Normal bowel sounds. Msk:  No deformity or scoliosis noted of thoracic or lumbar spine.   Pulses:  R and L  carotid,radial,femoral,dorsalis pedis and posterior tibial pulses are full and equal bilaterally Extremities:  No clubbing, cyanosis, edema, or deformity noted  Neurologic:  cranial nerves II-XII intact and gait normal.     Impression & Recommendations:  Problem # 1:  GERD (ICD-530.81) having problems with persistnat cough and clearing of throat that she is convienced thagt this is GERD  Her updated medication list for this problem includes:    Nexium 40 Mg Cpdr (Esomeprazole magnesium) .Marland Kitchen... 1 once daily  EGD: Location: [place]   (11/25/2003)  Labs Reviewed: Hgb: 14.5 (04/05/2009)   Hct: 43.7 (04/05/2009)  Problem # 2:  COUGH VARIANT ASTHMA (ICD-493.82) see if the cough improves wth two times a day reflux meds  Problem # 3:  HYPERTENSION (ICD-401.9)  Her updated medication list for this problem includes:    Innopran Xl 80 Mg Cp24 (Propranolol hcl sr beads) .Marland Kitchen... 1 capsule po once daily    Norvasc 2.5 Mg Tabs (Amlodipine besylate) .Marland Kitchen... 1 once daily    Clonidine Hcl 0.1 Mg Tabs (Clonidine hcl) .Marland Kitchen... 1/2 tab by mouth as needed for symptomatic  bp greater than 170  BP today: 132/80 Prior BP: 140/90 (04/05/2009)  Prior 10 Yr Risk Heart Disease: 5 % (11/30/2008)  Labs Reviewed: K+: 4.1 (04/05/2009) Creat: : 0.6 (04/05/2009)   Chol: 216 (04/05/2009)   HDL: 57.60 (04/05/2009)   LDL: 113 (11/23/2008)   TG: 135.0 (04/05/2009)  Problem # 4:  ASTHMATIC BRONCHITIS, ACUTE (ICD-466.0)  secondary to reflux? hx of smoking ( quit 30 years ago) Her updated medication list for this problem includes:    Atuss Ds 30-4-30 Mg/30m Susp (Pseudoephed hcl-cpm-dm hbr tan) ..Marland Kitchen.. As directed  Take antibiotics and other medications as directed. Encouraged to push clear liquids, get enough rest, and take acetaminophen as needed. To be seen in 5-7 days if no improvement, sooner if worse.  Complete Medication List: 1)  Halcion 0.25 Mg Tabs (Triazolam) .... 1/2 by mouth daily as  needed sleep 2)  Innopran Xl 80 Mg Cp24 (Propranolol hcl sr beads) ..Marland Kitchen. 1 capsule po once daily 3)  Adult Aspirin Ec Low Strength 81 Mg Tbec (Aspirin) ..Marland Kitchen. 1 tablet po once daily 4)  Fioricet 50-325-40 Mg Tabs (Butalbital-apap-caffeine) .... As needed headache 5)  Nexium 40 Mg Cpdr (Esomeprazole magnesium) ..Marland Kitchen. 1 once daily 6)  Norvasc 2.5 Mg Tabs (Amlodipine besylate) ..Marland Kitchen. 1 once daily 7)  Clonidine Hcl 0.1 Mg Tabs (Clonidine hcl) .... 1/2 tab by mouth as needed for symptomatic  bp greater than 170 8)  Multivitamins Caps (Multiple vitamin) ..Marland Kitchen. 1 once daily 9)  Flaxseed Oil 1000 Mg Caps (Flaxseed (linseed)) ..Marland Kitchen. 1 once daily 10)  Allegra 180 Mg Tabs (Fexofenadine hcl) .... One by mouth daily as needed 11)  Atuss Ds 30-4-30 Mg/563mSusp (Pseudoephed hcl-cpm-dm hbr tan) .... As directed  Hypertension Assessment/Plan:      The patient's hypertensive risk group is category  C: Target organ damage and/or diabetes.  Her calculated 10 year risk of coronary heart disease is 9 %.  Today's blood pressure is 132/80.  Her blood pressure goal is < 140/90.  Patient Instructions: 1)  Please schedule a follow-up appointment in 3 months. Prescriptions: ALLEGRA 180 MG TABS (FEXOFENADINE HCL) one by mouth daily as needed  #90 x 1   Entered by:   Allyne Gee, LPN   Authorized by:   Ricard Dillon MD   Signed by:   Allyne Gee, LPN on 79/72/8206   Method used:   Print then Give to Patient   RxID:   [id number] CLONIDINE HCL 0.1 MG TABS (CLONIDINE HCL) 1/2 tab by mouth as needed for symptomatic  BP greater than 170  #90 x 1   Entered by:   Allyne Gee, LPN   Authorized by:   Ricard Dillon MD   Signed by:   Allyne Gee, LPN on 70/92/9574   Method used:   Print then Give to Patient   RxID:   [id number] NORVASC 2.5 MG TABS (AMLODIPINE BESYLATE) 1 once daily  #90 x 3   Entered by:   Allyne Gee, LPN   Authorized by:   Ricard Dillon MD   Signed by:   Allyne Gee,  LPN on 40/37/5436   Method used:   Print then Give to Patient   RxID:   [id number] [place] 40 MG CPDR (ESOMEPRAZOLE MAGNESIUM) 1 once daily  #90 x 3   Entered by:   Allyne Gee, LPN   Authorized by:   Ricard Dillon MD   Signed by:   Allyne Gee, LPN on 90/93/1121   Method used:   Print then Give to Patient   RxID:   [id number] FIORICET 50-325-40 MG  TABS [facility]) as needed headache  #90 x 1   Entered by:   Allyne Gee, LPN   Authorized by:   Ricard Dillon MD   Signed by:   Allyne Gee, LPN on 18/33/5825   Method used:   Print then Give to Patient   RxID:   [id number] INNOPRAN XL 80 MG  CP24 (PROPRANOLOL HCL SR BEADS) 1 capsule po once daily  #90 x 3   Entered by:   Allyne Gee, LPN   Authorized by:   Ricard Dillon MD   Signed by:   Allyne Gee, LPN on 67/73/7366   Method used:   Print then Give to Patient   RxID:   [id number] HALCION 0.25 MG  TABS (TRIAZOLAM) 1/2 by mouth daily as needed sleep  #90 x 1   Entered by:   Allyne Gee, LPN   Authorized by:   Ricard Dillon MD   Signed by:   Allyne Gee, LPN on 73/57/8978   Method used:   Print then Give to Patient   RxID:   [id number]

## 2010-10-11 NOTE — Progress Notes (Signed)
Summary: ref neurosurgeon  Phone Note Call from Patient Call back at Home Phone [phone]   Caller: vm Summary of Call: Referral to neurosurgeon for disc problem.    Went to ortho this am.   Initial call taken by: Shelbie Hutching, RN,  November 12, 2009 2:25 PM  Follow-up for Phone Call        refer to jeff Whit Bruni Follow-up by: Ricard Dillon MD,  November 12, 2009 4:06 PM  Additional Follow-up for Phone Call Additional follow up Details #1::        Phone Call Completed Additional Follow-up by: Shelbie Hutching, RN,  November 12, 2009 4:38 PM

## 2010-10-11 NOTE — Assessment & Plan Note (Signed)
**Note De-Identified  Obfuscation** Summary: cpx/njr   Vital Signs:  Patient profile:   60 year old female Height:      61 inches Weight:      146 pounds BMI:     27.69 Temp:     98.2 degrees F oral Pulse rate:   72 / minute Resp:     14 per minute BP sitting:   138 / 80  (left arm)  Vitals Entered By: Allyne Gee, LPN (June 30, 4267 10:46 AM) CC: annual visit for disease management, Hypertension Management Is Patient Diabetic? No   Primary Care Provider:  Benay Pillow, M.D.  CC:  annual visit for disease management and Hypertension Management.  History of Present Illness: The pt was asked about all immunizations, health maint. services that are appropriate to their age and was given guidance on diet exercize  and weight management  the pt has cntrolled htn the pt has tia vs sz vs hypertensive crisis with  hx if cva and anurysm the pt has ER visit with MRI and MRA and incluclusive findings  Hypertension History:      She denies headache, chest pain, palpitations, dyspnea with exertion, orthopnea, PND, peripheral edema, visual symptoms, neurologic problems, syncope, and side effects from treatment.        Positive major cardiovascular risk factors include female age 74 years old or older, hyperlipidemia, and hypertension.  Negative major cardiovascular risk factors include negative family history for ischemic heart disease and non-tobacco-user status.        Positive history for target organ damage include prior stroke (or TIA).     Preventive Screening-Counseling & Management  Alcohol-Tobacco     Smoking Status: never     Passive Smoke Exposure: no     Tobacco Counseling: not indicated; no tobacco use  Problems Prior to Update: 1)  Dizziness, Chronic  (ICD-780.4) 2)  Cough Variant Asthma  (ICD-493.82) 3)  Chest Discomfort  (ICD-786.59) 4)  Back Pain, Lumbar, With Radiculopathy  (ICD-724.4) 5)  Unspecified Sinusitis  (ICD-473.9) 6)  Cough  (ICD-786.2) 7)  Leg Pain  (ICD-729.5) 8)  Degenerative  Disc Disease, Cervical Spine  (ICD-722.4) 9)  Rectal Bleeding  (ICD-569.3) 10)  Constipation  (ICD-564.00) 11)  Diarrhea  (ICD-787.91) 12)  Cerebral Aneurysm  (ICD-437.3) 13)  Anal Fissure  (ICD-565.0) 14)  Hiatal Hernia  (ICD-553.3) 15)  Asthmatic Bronchitis, Acute  (ICD-466.0) 16)  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) 17)  Unspecified Venous Insufficiency  (ICD-459.81) 18)  Facial Paresthesia, Left  (ICD-782.0) 19)  Breast Cysts, Bilateral  (ICD-610.1) 20)  Physical Examination  (ICD-V70.0) 21)  Sarcoidosis  (ICD-135) 22)  Gerd  (ICD-530.81) 23)  Cholelithiasis  (ICD-574.20) 24)  Hypertension  (ICD-401.9) 25)  Carpal Tunnel Syndrome  (ICD-354.0) 26)  Colitis  (ICD-558.9) 27)  Sleep Apnea  (ICD-780.57) 28)  Common Migraine  (ICD-346.10) 29)  Crohn's Disease  (ICD-555.9)  Current Problems (verified): 1)  Dizziness, Chronic  (ICD-780.4) 2)  Cough Variant Asthma  (ICD-493.82) 3)  Chest Discomfort  (ICD-786.59) 4)  Back Pain, Lumbar, With Radiculopathy  (ICD-724.4) 5)  Unspecified Sinusitis  (ICD-473.9) 6)  Cough  (ICD-786.2) 7)  Leg Pain  (ICD-729.5) 8)  Degenerative Disc Disease, Cervical Spine  (ICD-722.4) 9)  Rectal Bleeding  (ICD-569.3) 10)  Constipation  (ICD-564.00) 11)  Diarrhea  (ICD-787.91) 12)  Cerebral Aneurysm  (ICD-437.3) 13)  Anal Fissure  (ICD-565.0) 14)  Hiatal Hernia  (ICD-553.3) 15)  Asthmatic Bronchitis, Acute  (ICD-466.0) 16)  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 1 At Bedtime As Needed Sleep 2)  Innopran Xl 80 Mg  Cp24 (Propranolol Hcl Sr Beads) .Marland Kitchen.. 1 Capsule Po Once Daily 3)  Adult Aspirin Ec Low Strength 81 Mg  Tbec (Aspirin) .Marland Kitchen.. 1 Tablet Po Once Daily 4)  Fioricet 50-325-40 Mg  Tabs (Butalbital-Apap-Caffeine) .Marland Kitchen.. 1 Every 4-6 Hours As Needed Headache 5)  Nexium 40 Mg Cpdr (Esomeprazole Magnesium) .Marland Kitchen.. 1 Once Daily 6)  Norvasc 2.5 Mg Tabs (Amlodipine Besylate) .Marland Kitchen.. 1 Once Daily 7)  Clonidine Hcl 0.1 Mg Tabs (Clonidine Hcl) .... 1/2 Tab By Mouth As Needed For Symptomatic  Bp Greater Than 170 8)  Multivitamins  Caps (Multiple Vitamin) .Marland Kitchen.. 1 Once Daily 9)  Flaxseed Oil 1000 Mg Caps (Flaxseed (Linseed)) .Marland Kitchen.. 1 Once Daily 10)  Allegra 180 Mg Tabs (Fexofenadine Hcl) .... One By Mouth Daily As Needed 11)  Atuss Ds 30-4-30 Mg/75m Susp (Pseudoephed Hcl-Cpm-Dm Hbr Tan) .... As Directed  Current Medications (verified): 1)  Halcion 0.25 Mg  Tabs (Triazolam) ..Marland Kitchen. 1 At Bedtime As Needed Sleep 2)  Innopran Xl 80 Mg  Cp24 (Propranolol Hcl Sr Beads) ..Marland Kitchen. 1 Capsule Po Once Daily 3)  Adult Aspirin Ec Low Strength 81 Mg  Tbec (Aspirin) ..Marland Kitchen. 1 Tablet Po Once Daily 4)  Fioricet 50-325-40 Mg  Tabs (Butalbital-Apap-Caffeine) ..Marland Kitchen. 1 Every 4-6 Hours As Needed Headache 5)  Prilosec Otc 20 Mg Tbec (Omeprazole Magnesium) ..Marland Kitchen. 1 Once Daily 6)  Norvasc 2.5 Mg Tabs (Amlodipine Besylate) ..Marland Kitchen. 1 Once Daily 7)  Clonidine Hcl 0.1 Mg Tabs (Clonidine Hcl) .... 1/2 Tab By Mouth As Needed For Symptomatic  Bp Greater Than 170 8)  Multivitamins  Caps (Multiple Vitamin) ..Marland Kitchen. 1 Once Daily 9)  Flaxseed Oil 1000 Mg Caps (Flaxseed (Linseed)) ..Marland Kitchen. 1 Once Daily 10)  Allegra 180 Mg Tabs (Fexofenadine Hcl) .... One By Mouth Daily As Needed 11)  Atuss Ds 30-4-30 Mg/579mSusp (Pseudoephed Hcl-Cpm-Dm Hbr Tan) .... As Directed 12)  Flexeril 10 Mg Tabs (Cyclobenzaprine Hcl) ...Marland Kitchen 1 Once Daily  Allergies (verified): 1)  Demerol (Meperidine  Hcl) 2)  Penicillin G Potassium (Penicillin G Potassium)  Past History:  Family History: Last updated: 04/30/2008 n/a Family History of Esophageal Cancer:father Family History of Stomach Cancer:father  Social History: Last updated: 04/05/2007 Retired Married Never Smoked Alcohol use-yes  Risk Factors: Smoking Status: never (06/29/2010) Passive Smoke Exposure: no (06/29/2010)  Past medical, surgical, family and social histories (including risk factors) reviewed, and no changes noted (except as noted below).  Past Medical History: Reviewed history from 04/05/2007 and no changes required. Hypertension hx of aneurysm in brain 1971 hx of right intracranial hemorrage crohns hx of hemicolectomy 1981 sleep apnea GERD  Past Surgical History: Reviewed history from 04/29/2008 and no changes required. breast lumectomy Colectomy 1981 right Carpal tunnel release Hysterectomy 1983 Tonsillectomy  Family History: Reviewed history from 04/30/2008 and no changes required. n/a Family History of Esophageal Cancer:father Family History of Stomach Cancer:father  Social History: Reviewed history from 04/05/2007 and no changes **Note De-Identified  Obfuscation** required. Retired Married Never Smoked Alcohol use-yes  Review of Systems  The patient denies anorexia, fever, weight loss, weight gain, vision loss, decreased hearing, hoarseness, chest pain, syncope, dyspnea on exertion, peripheral edema, prolonged cough, headaches, hemoptysis, abdominal pain, melena, hematochezia, severe indigestion/heartburn, hematuria, incontinence, genital sores, muscle weakness, suspicious skin lesions, transient blindness, difficulty walking, depression, unusual weight change, abnormal bleeding, enlarged lymph nodes, angioedema, and breast masses.    Physical Exam  General:  Well-developed,well-nourished,in no acute distress; alert,appropriate and cooperative throughout examination Head:  normocephalic and atraumatic.   Eyes:   pupils equal and pupils round.   Ears:  R ear normal and L ear normal.   Mouth:  posterior lymphoid hypertrophy and postnasal drip.   Neck:  No deformities, masses, or tenderness noted. Lungs:  Normal respiratory effort, chest expands symmetrically. Lungs are clear to auscultation, no crackles or wheezes. Heart:  Normal rate and regular rhythm. S1 and S2 normal without gallop, murmur, click, rub or other extra sounds. Abdomen:  Soft, nontender and nondistended. No masses, hepatosplenomegaly or hernias noted. Normal bowel sounds. Msk:  No deformity or scoliosis noted of thoracic or lumbar spine.   Pulses:  R and L carotid,radial,femoral,dorsalis pedis and posterior tibial pulses are full and equal bilaterally Extremities:  No clubbing, cyanosis, edema, or deformity noted  Neurologic:  cranial nerves II-XII intact and gait normal.     Impression & Recommendations:  Problem # 1:  PHYSICAL EXAMINATION (ICD-V70.0) The pt was asked about all immunizations, health maint. services that are appropriate to their age and was given guidance on diet exercize  and weight management  Mammogram: normal (09/10/2009) Pap smear: NEGATIVE FOR INTRAEPITHELIAL LESIONS OR MALIGNANCY. (04/05/2009) Colonoscopy: Location:  [facility].   (08/17/2008) Td Booster: Tdap (04/05/2009)   Flu Vax: Historical (06/11/2010)   Pneumovax: Pneumovax (04/05/2009) Chol: 216 (04/05/2009)   HDL: 57.60 (04/05/2009)   LDL: 113 (11/23/2008)   TG: 135.0 (04/05/2009) TSH: 1.72 (04/05/2009)   Next mammogram due:: 09/2010 (06/29/2010) Next Colonoscopy due:: 08/2011 (08/17/2008)  Discussed using sunscreen, use of alcohol, drug use, self breast exam, routine dental care, routine eye care, schedule for GYN exam, routine physical exam, seat belts, multiple vitamins, osteoporosis prevention, adequate calcium intake in diet, recommendations for immunizations, mammograms and Pap smears.  Discussed exercise and checking cholesterol.   Discussed gun safety, safe sex, and contraception.  Problem # 2:  HYPERLIPIDEMIA, BORDERLINE (ICD-272.4)  Labs Reviewed: SGOT: 26 (04/05/2009)   SGPT: 22 (04/05/2009)  Prior 10 Yr Risk Heart Disease: 9 % (09/15/2009)   HDL:57.60 (04/05/2009), 55.0 (11/23/2008)  LDL:113 (11/23/2008), 120 (09/24/2007)  Chol:216 (04/05/2009), 192 (11/23/2008)  Trig:135.0 (04/05/2009), 119 (11/23/2008)  Problem # 3:  HYPERTENSION (ICD-401.9) blod pressure spikes vs TIA with blood mpressure response vs SZ Her updated medication list for this problem includes:    Innopran Xl 80 Mg Cp24 (Propranolol hcl sr beads) .Marland Kitchen... 1 capsule po once daily    Norvasc 2.5 Mg Tabs (Amlodipine besylate) .Marland Kitchen... 1 once daily    Clonidine Hcl 0.1 Mg Tabs (Clonidine hcl) .Marland Kitchen... 1/2 tab by mouth as needed for symptomatic  bp greater than 170  BP today: 138/80 Prior BP: 136/80 (03/21/2010)  Prior 10 Yr Risk Heart Disease: 9 % (09/15/2009)  Labs Reviewed: K+: 4.1 (04/05/2009) Creat: : 0.6 (04/05/2009)   Chol: 216 (04/05/2009)   HDL: 57.60 (04/05/2009)   LDL: 113 (11/23/2008)   TG: 135.0 (04/05/2009)  Problem # 4:  CEREBRAL ANEURYSM (ICD-437.3) hx  MRIA an dMRI stable possilble TIA of **Note De-Identified  Obfuscation** small sz but work up was not certain moniter blood pressure and consider eeg is recurrent  Complete Medication List: 1)  Halcion 0.25 Mg Tabs (Triazolam) .Marland Kitchen.. 1 at bedtime as needed sleep 2)  Innopran Xl 80 Mg Cp24 (Propranolol hcl sr beads) .Marland Kitchen.. 1 capsule po once daily 3)  Adult Aspirin Ec Low Strength 81 Mg Tbec (Aspirin) .Marland Kitchen.. 1 tablet po once daily 4)  Fioricet 50-325-40 Mg Tabs (Butalbital-apap-caffeine) .Marland Kitchen.. 1 every 4-6 hours as needed headache 5)  Prilosec Otc 20 Mg Tbec (Omeprazole magnesium) .Marland Kitchen.. 1 once daily 6)  Norvasc 2.5 Mg Tabs (Amlodipine besylate) .Marland Kitchen.. 1 once daily 7)  Clonidine Hcl 0.1 Mg Tabs (Clonidine hcl) .... 1/2 tab by mouth as needed for symptomatic  bp greater than 170 8)  Multivitamins Caps (Multiple vitamin) .Marland Kitchen.. 1 once  daily 9)  Flaxseed Oil 1000 Mg Caps (Flaxseed (linseed)) .Marland Kitchen.. 1 once daily 10)  Allegra 180 Mg Tabs (Fexofenadine hcl) .... One by mouth daily as needed 11)  Atuss Ds 30-4-30 Mg/54m Susp (Pseudoephed hcl-cpm-dm hbr tan) .... As directed 12)  Flexeril 10 Mg Tabs (Cyclobenzaprine hcl) ..Marland Kitchen. 1 once daily  Hypertension Assessment/Plan:      The patient's hypertensive risk group is category C: Target organ damage and/or diabetes.  Her calculated 10 year risk of coronary heart disease is 9 %.  Today's blood pressure is 138/80.  Her blood pressure goal is < 140/90.  Patient Instructions: 1)  Please schedule a follow-up appointment in 3 months. 2)  Hepatic Panel prior to visit, ICD-9:995.20 3)  Lipid Panel prior to visit, ICD-9:272.4 Prescriptions: FIORICET 50-325-40 MG  TABS (BUTALBITAL-APAP-CAFFEINE) 1 every 4-6 hours as needed headache  #90 x 1   Entered by:   BAllyne Gee LPN   Authorized by:   JRicard DillonMD   Signed by:   BAllyne Gee LPN on 150/27/7412  Method used:   Print then Give to Patient   RxID:   [id number] MG TABS (AMLODIPINE BESYLATE) 1 once daily  #90 x 3   Entered by:   BAllyne Gee LPN   Authorized by:   JRicard DillonMD   Signed by:   BAllyne Gee LPN on 183/66/2947  Method used:   Print then Give to Patient   RxID:   [id number] 0.25 MG  TABS (TRIAZOLAM) 1 at bedtime as needed sleep  #90 x 1   Entered by:   BAllyne Gee LPN   Authorized by:   JRicard DillonMD   Signed by:   BAllyne Gee LPN on 151/70/0174  Method used:   Print then Give to Patient   RxID:   [id number] XL 80 MG  CP24 (PROPRANOLOL HCL SR BEADS) 1 capsule po once daily  #90 x 3   Entered by:   BAllyne Gee LPN   Authorized by:   JRicard DillonMD   Signed by:   BAllyne Gee LPN on 199/35/7017  Method used:   Print then Give to Patient   RxID:   [id number]   Orders Added: 1)  Est. Patient 65& >  [[22633]2)  Est. Patient Level III [[35456]  Immunization History:  Influenza Immunization History:    Influenza:  historical (06/11/2010)   Immunization History:  Influenza Immunization History:    Influenza:  Historical (06/11/2010)   Preventive Care Screening  Mammogram:    Date:  09/10/2009 **Note De-Identified  Obfuscation** Next Due:  09/2010    Results:  normal   Pap Smear:    Date:  03/06/2009    Next Due:  04/2012    Results:  normal   Appended Document: Orders Update     Clinical Lists Changes  Observations: Added new observation of ZOSTAVAX VIS: 06/23/05 given June 29, 2010. (06/29/2010 11:53) Added new observation of ZOSTAVAX LOT: [id number] (06/29/2010 11:53) Added new observation of ZOSTAVAX EXP: 04/29/2011 (06/29/2010 11:53) Added new observation of ZOSTAVAX BY: Allyne Gee, LPN (04/54/0981 19:14) Added new observation of ZOSTAVAX RTE: [place] (06/29/2010 11:53) Added new observation of ZOSTAVAXDOSE: 0.5 ml (06/29/2010 11:53) Added new observation of ZOSTAVAX MFR: Mer[employer] (06/29/2010 11:53) Added new observation of ZOSTAVAXSITE: right deltoid (06/29/2010 11:53) Added new observation of ZOSTAVAX: Zostavax (06/29/2010 11:53)       Immunizations Administered:  Zostavax # 1:    Vaccine Type: Zostavax    Site: right deltoid    Mfr: Merck    Dose: 0.5 ml    Route: [place]    Given by: Allyne Gee, LPN    Exp. Date: 04/29/2011    Lot #: [id number]    VIS given: 06/23/05 given June 29, 2010.

## 2010-10-11 NOTE — Assessment & Plan Note (Signed)
Summary: 3 MONTH ROV/NJR---PTS HUSBAND Xenia // RS/PT [facility] FROM BMP/CJR--...   Vital Signs:  Patient profile:   60 year old female Height:      61 inches Weight:      146 pounds BMI:     27.69 Temp:     98.2 degrees F oral Pulse rate:   72 / minute Resp:     14 per minute BP sitting:   136 / 80  (left arm)  Vitals Entered By: Allyne Gee, LPN (March 21, 1609 96:04 AM) CC: roa, Hypertension Management   Primary Care Provider:  Benay Pillow, M.D.  CC:  roa and Hypertension Management.  History of Present Illness: The pr presents for stable problems with head aches and dizzyness has been to the neurologist and neurosurgeon ( Dr Vertell Limber) and he thinkins that the persistant numbness  is from the stroke no form the discs The neck pain is due to the disc  Hypertension History:      She denies headache, chest pain, palpitations, dyspnea with exertion, orthopnea, PND, peripheral edema, visual symptoms, neurologic problems, syncope, and side effects from treatment.        Positive major cardiovascular risk factors include female age 16 years old or older and hypertension.  Negative major cardiovascular risk factors include negative family history for ischemic heart disease and non-tobacco-user status.        Positive history for target organ damage include prior stroke (or TIA).     Preventive Screening-Counseling & Management  Alcohol-Tobacco     Smoking Status: never     Passive Smoke Exposure: no  Problems Prior to Update: 1)  Dizziness, Chronic  (ICD-780.4) 2)  Cough Variant Asthma  (ICD-493.82) 3)  Chest Discomfort  (ICD-786.59) 4)  Back Pain, Lumbar, With Radiculopathy  (ICD-724.4) 5)  Unspecified Sinusitis  (ICD-473.9) 6)  Cough  (ICD-786.2) 7)  Leg Pain  (ICD-729.5) 8)  Degenerative Disc Disease, Cervical Spine  (ICD-722.4) 9)  Rectal Bleeding  (ICD-569.3) 10)  Constipation  (ICD-564.00) 11)  Diarrhea  (ICD-787.91) 12)  Cerebral Aneurysm  (ICD-437.3) 13)  Anal  Fissure  (ICD-565.0) 14)  Hiatal Hernia  (ICD-553.3) 15)  Asthmatic Bronchitis, Acute  (ICD-466.0) 16)  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 1/2 By Mouth Daily As Needed Sleep 2)  Innopran Xl 80 Mg  Cp24 (Propranolol Hcl Sr Beads) .Marland Kitchen.. 1 Capsule Po Once Daily 3)  Adult Aspirin Ec Low Strength 81 Mg  Tbec (Aspirin) .Marland Kitchen.. 1 Tablet Po Once Daily 4)  Fioricet 50-325-40 Mg  Tabs (Butalbital-Apap-Caffeine) .... As Needed Headache 5)  Nexium 40 Mg Cpdr (Esomeprazole Magnesium) .Marland Kitchen.. 1 Once Daily 6)  Norvasc 2.5 Mg Tabs (Amlodipine Besylate) .Marland Kitchen.. 1 Once Daily 7)  Clonidine Hcl 0.1 Mg Tabs (Clonidine Hcl) .... 1/2 Tab By Mouth As Needed For Symptomatic  Bp Greater Than 170 8)  Multivitamins  Caps (Multiple Vitamin) .Marland Kitchen.. 1 Once Daily 9)  Flaxseed Oil 1000 Mg Caps (Flaxseed (Linseed)) .Marland Kitchen.. 1 Once  Daily 10)  Allegra 180 Mg Tabs (Fexofenadine Hcl) .... One By Mouth Daily As Needed 11)  Atuss Ds 30-4-30 Mg/13m Susp (Pseudoephed Hcl-Cpm-Dm Hbr Tan) .... As Directed  Current Medications (verified): 1)  Halcion 0.25 Mg  Tabs (Triazolam) ..Marland Kitchen. 1 At Bedtime As Needed Sleep 2)  Innopran Xl 80 Mg  Cp24 (Propranolol Hcl Sr Beads) ..Marland Kitchen. 1 Capsule Po Once Daily 3)  Adult Aspirin Ec Low Strength 81 Mg  Tbec (Aspirin) ..Marland Kitchen. 1 Tablet Po Once Daily 4)  Fioricet 50-325-40 Mg  Tabs (Butalbital-Apap-Caffeine) .... As Needed Headache 5)  Nexium 40 Mg Cpdr (Esomeprazole Magnesium) ..Marland Kitchen. 1  Once Daily 6)  Norvasc 2.5 Mg Tabs (Amlodipine Besylate) ..Marland Kitchen. 1 Once Daily 7)  Clonidine Hcl 0.1 Mg Tabs (Clonidine Hcl) .... 1/2 Tab By Mouth As Needed For Symptomatic  Bp Greater Than 170 8)  Multivitamins  Caps (Multiple Vitamin) ..Marland Kitchen. 1 Once Daily 9)  Flaxseed Oil 1000 Mg Caps (Flaxseed (Linseed)) ..Marland Kitchen. 1 Once Daily 10)  Allegra 180 Mg Tabs (Fexofenadine Hcl) .... One By Mouth Daily As Needed 11)  Atuss Ds 30-4-30 Mg/570mSusp (Pseudoephed Hcl-Cpm-Dm Hbr Tan) .... As Directed  Allergies (verified): 1)  Demerol (Meperidine Hcl) 2)  Penicillin G Potassium (Penicillin G Potassium)  Past History:  Family History: Last updated: 04/30/2008 n/a Family History of Esophageal Cancer:father Family History of Stomach Cancer:father  Social History: Last updated: 04/05/2007 Retired Married Never Smoked Alcohol use-yes  Risk Factors: Smoking Status: never (03/21/2010) Passive Smoke Exposure: no (03/21/2010)  Past medical, surgical, family and social histories (including risk factors) reviewed, and no changes noted (except as noted below).  Past Medical History: Reviewed history from 04/05/2007 and no changes required. Hypertension hx of aneurysm in brain 1971 hx of right intracranial hemorrage crohns hx of hemicolectomy 1981 sleep apnea GERD  Past Surgical History: Reviewed history from 04/29/2008 and no changes required. breast lumectomy Colectomy 1981 right Carpal tunnel release Hysterectomy 1983 Tonsillectomy  Family History: Reviewed history from 04/30/2008 and no changes required. n/a Family History of Esophageal Cancer:father Family History of Stomach Cancer:father  Social History: Reviewed history from 04/05/2007 and no changes required. Retired Married Never Smoked Alcohol use-yes  Review of Systems  The patient denies anorexia, fever, weight loss, weight gain, vision loss, decreased hearing, hoarseness, chest pain, syncope, dyspnea on exertion, peripheral  edema, prolonged cough, headaches, hemoptysis, abdominal pain, melena, hematochezia, severe indigestion/heartburn, hematuria, incontinence, genital sores, muscle weakness, suspicious skin lesions, transient blindness, difficulty walking, depression, unusual weight change, abnormal bleeding, enlarged lymph nodes, angioedema, and breast masses.    Physical Exam  General:  Well-developed,well-nourished,in no acute distress; alert,appropriate and cooperative throughout examination Head:  normocephalic and atraumatic.   Eyes:  pupils equal and pupils round.   Ears:  R ear normal and L ear normal.   Mouth:  posterior lymphoid hypertrophy and postnasal drip.   Neck:  No deformities, masses, or tenderness noted. Lungs:  Normal respiratory effort, chest expands symmetrically. Lungs are clear to auscultation, no crackles or wheezes. Heart:  Normal rate and regular rhythm. S1 and S2 normal without gallop, murmur, click, rub or other extra sounds. Abdomen:  Soft, nontender and nondistended. No masses, hepatosplenomegaly or hernias noted. Normal bowel sounds. Msk:  No deformity or scoliosis noted of thoracic or lumbar spine.   Extremities:  No clubbing, cyanosis, edema, or deformity noted    Impression & Recommendations:  Problem # 1:  COUGH VARIANT ASTHMA (ICD-493.82) with flair due to acid reflux  Problem # 2:  GERD (  ICD-530.81) discussion nof prilosec Her updated medication list for this problem includes:    Nexium 40 Mg Cpdr (Esomeprazole magnesium) .Marland Kitchen... 1 once daily  EGD: Location: [place]   (11/25/2003)  Labs Reviewed: Hgb: 14.5 (04/05/2009)   Hct: 43.7 (04/05/2009)  Problem # 3:  SARCOIDOSIS (ICD-135) stable  Problem # 4:  HYPERTENSION (ICD-401.9) Assessment: Unchanged  Her updated medication list for this problem includes:    Innopran Xl 80 Mg Cp24 (Propranolol hcl sr beads) .Marland Kitchen... 1 capsule po once daily    Norvasc 2.5 Mg Tabs (Amlodipine besylate) .Marland Kitchen... 1 once  daily    Clonidine Hcl 0.1 Mg Tabs (Clonidine hcl) .Marland Kitchen... 1/2 tab by mouth as needed for symptomatic  bp greater than 170  BP today: 136/80 Prior BP: 132/80 (09/15/2009)  Prior 10 Yr Risk Heart Disease: 9 % (09/15/2009)  Labs Reviewed: K+: 4.1 (04/05/2009) Creat: : 0.6 (04/05/2009)   Chol: 216 (04/05/2009)   HDL: 57.60 (04/05/2009)   LDL: 113 (11/23/2008)   TG: 135.0 (04/05/2009)  Complete Medication List: 1)  Halcion 0.25 Mg Tabs (Triazolam) .Marland Kitchen.. 1 at bedtime as needed sleep 2)  Innopran Xl 80 Mg Cp24 (Propranolol hcl sr beads) .Marland Kitchen.. 1 capsule po once daily 3)  Adult Aspirin Ec Low Strength 81 Mg Tbec (Aspirin) .Marland Kitchen.. 1 tablet po once daily 4)  Fioricet 50-325-40 Mg Tabs (Butalbital-apap-caffeine) .... As needed headache 5)  Nexium 40 Mg Cpdr (Esomeprazole magnesium) .Marland Kitchen.. 1 once daily 6)  Norvasc 2.5 Mg Tabs (Amlodipine besylate) .Marland Kitchen.. 1 once daily 7)  Clonidine Hcl 0.1 Mg Tabs (Clonidine hcl) .... 1/2 tab by mouth as needed for symptomatic  bp greater than 170 8)  Multivitamins Caps (Multiple vitamin) .Marland Kitchen.. 1 once daily 9)  Flaxseed Oil 1000 Mg Caps (Flaxseed (linseed)) .Marland Kitchen.. 1 once daily 10)  Allegra 180 Mg Tabs (Fexofenadine hcl) .... One by mouth daily as needed 11)  Atuss Ds 30-4-30 Mg/67m Susp (Pseudoephed hcl-cpm-dm hbr tan) .... As directed  Hypertension Assessment/Plan:      The patient's hypertensive risk group is category C: Target organ damage and/or diabetes.  Her calculated 10 year risk of coronary heart disease is 9 %.  Today's blood pressure is 136/80.  Her blood pressure goal is < 140/90.  Patient Instructions: 1)  Please schedule a follow-up appointment in 3 months. Prescriptions: ALLEGRA 180 MG TABS (FEXOFENADINE HCL) one by mouth daily as needed  #90 x 3   Entered by:   BAllyne Gee LPN   Authorized by:   JRicard DillonMD   Signed by:   BAllyne Gee LPN on 062/83/1517  Method used:   Print then Give to Patient   RxID:   [id number] HCL  0.1 MG TABS (CLONIDINE HCL) 1/2 tab by mouth as needed for symptomatic  BP greater than 170  #15 x 1   Entered by:   BAllyne Gee LPN   Authorized by:   JRicard DillonMD   Signed by:   BAllyne Gee LPN on 046/27/0350  Method used:   Print then Give to Patient   RxID:   [id number] 2.5 MG TABS (AMLODIPINE BESYLATE) 1 once daily  #90 x 3   Entered by:   BAllyne Gee LPN   Authorized by:   JRicard DillonMD   Signed by:   BAllyne Gee LPN on 089/38/1017  Method used:   Print then Give to Patient   RxID:   [id number]  INNOPRAN XL 80 MG  CP24 (PROPRANOLOL HCL SR BEADS) 1 capsule po once daily  #90 x 3   Entered by:   Allyne Gee, LPN   Authorized by:   Ricard Dillon MD   Signed by:   Allyne Gee, LPN on 07/05/4861   Method used:   Print then Give to Patient   RxID:   [id number] FIORICET 50-325-40 MG  TABS [facility]) as needed headache  #30 Tablet x 1   Entered by:   Allyne Gee, LPN   Authorized by:   Ricard Dillon MD   Signed by:   Allyne Gee, LPN on 36/85/9923   Method used:   Print then Give to Patient   RxID:   [id number] HALCION 0.25 MG  TABS (TRIAZOLAM) 1 at bedtime as needed sleep  #90 x 1   Entered by:   Allyne Gee, LPN   Authorized by:   Ricard Dillon MD   Signed by:   Allyne Gee, LPN on 94/94/4739   Method used:   Print then Give to Patient   RxID:   [id number]

## 2010-10-11 NOTE — Progress Notes (Signed)
Summary: please return call  Phone Note Call from Patient Call back at Home Phone [phone]   Caller: Patient--live call Summary of Call: Dr Arnoldo Morale told to call Julia [name] to discuss her decision. No further message was given. Please return her call. Initial call taken by: Despina Arias,  October 26, 2009 8:14 AM  New Problems: DIZZINESS, CHRONIC (ICD-780.4)   New Problems: DIZZINESS, CHRONIC (ICD-780.4)

## 2010-10-11 NOTE — Letter (Signed)
Summary: [facility]  [facility]   Imported By: Laural Benes 02/22/2010 14:18:52  _____________________________________________________________________  External Attachment:    Type:   Image     Comment:   External Document

## 2010-10-11 NOTE — Letter (Signed)
Summary: [facility]  [facility]   Imported By: Laural Benes 12/23/2009 14:04:49  _____________________________________________________________________  External Attachment:    Type:   Image     Comment:   External Document

## 2010-10-11 NOTE — Progress Notes (Signed)
  Phone Note Call from Patient   Caller: Patient Call For: Ricard Dillon MD Summary of Call: Sinus congestion, bleeding nose, and allergic symptoms.  Asking what to take???[phone] [facility] [phone] Initial call taken by: Deanna Artis CMA,  May 02, 2010 3:07 PM  Follow-up for Phone Call        allegra plain 180 and saline nasal mist 4-5 times a day Follow-up by: Ricard Dillon MD,  May 03, 2010 9:41 AM  Additional Follow-up for Phone Call Additional follow up Details #1::        Notified pt. Additional Follow-up by: Deanna Artis CMA,  May 03, 2010 10:07 AM

## 2010-10-11 NOTE — Progress Notes (Signed)
Summary: halcion  Phone Note Call from Patient   Reason for Call: Acute Illness Summary of Call: [facility] [phone] Tim fell and broke his arm so they could not keep their last appts.  They have rescheduled in July.  She needs a refill on Halcion.   [phone] Initial call taken by: Deanna Artis CMA,  Feb 02, 2010 10:14 AM  Follow-up for Phone Call        done Follow-up by: Allyne Gee, LPN,  Feb 03, 9627 36:62 AM

## 2010-10-13 NOTE — Assessment & Plan Note (Signed)
Summary: 3 mnth rov//slm/pt rescd//cm   Vital Signs:  Patient profile:   60 year old female Height:      61 inches Weight:      146 pounds BMI:     27.69 Temp:     98.2 degrees F oral Pulse rate:   72 / minute Resp:     14 per minute BP sitting:   124 / 74  (left arm)  Vitals Entered By: Allyne Gee, LPN (October 04, 2990 11:32 AM) CC: roa labs after starting crestor 20 1 every week, Hypertension Management Is Patient Diabetic? No   Primary Care Provider:  Benay Pillow, M.D.  CC:  roa labs after starting crestor 20 1 every week and Hypertension Management.  History of Present Illness:  patient is a 60 year old white female who presents for followup of hyperlipidemia having been placed on Crestor 20 mg. there is excellent response to this intervention with a total cholesterol 185 triglycerides of 113 HDL 70 and LDL 90 median all goals her liver functions as well are at goal..  She is also followed for hypertension with known labile hypertension with intermittent blood pressure bursts she reports a recent blood pressure burst that resulted in a trip to the emergency room however she was not seen as her blood pressure result in the parking lot..  Hypertension History:      She denies headache, chest pain, palpitations, dyspnea with exertion, orthopnea, PND, peripheral edema, visual symptoms, neurologic problems, syncope, and side effects from treatment.        Positive major cardiovascular risk factors include female age 81 years old or older, hyperlipidemia, and hypertension.  Negative major cardiovascular risk factors include negative family history for ischemic heart disease and non-tobacco-user status.        Positive history for target organ damage include prior stroke (or TIA).     Preventive Screening-Counseling & Management  Alcohol-Tobacco     Smoking Status: never     Passive Smoke Exposure: no     Tobacco Counseling: not indicated; no tobacco use  Problems Prior  to Update: 1)  Hyperlipidemia, Borderline  (ICD-272.4) 2)  Dizziness, Chronic  (ICD-780.4) 3)  Cough Variant Asthma  (ICD-493.82) 4)  Chest Discomfort  (ICD-786.59) 5)  Back Pain, Lumbar, With Radiculopathy  (ICD-724.4) 6)  Unspecified Sinusitis  (ICD-473.9) 7)  Cough  (ICD-786.2) 8)  Leg Pain  (ICD-729.5) 9)  Degenerative Disc Disease, Cervical Spine  (ICD-722.4) 10)  Rectal Bleeding  (ICD-569.3) 11)  Constipation  (ICD-564.00) 12)  Diarrhea  (ICD-787.91) 13)  Cerebral Aneurysm  (ICD-437.3) 14)  Anal Fissure  (ICD-565.0) 15)  Hiatal Hernia  (ICD-553.3) 16)  Asthmatic Bronchitis, Acute  (ICD-466.0) 17)  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) 18)  Unspecified Venous Insufficiency  (ICD-459.81) 19)  Facial Paresthesia, Left  (ICD-782.0) 20)  Breast Cysts, Bilateral  (ICD-610.1) 21)  Physical Examination  (ICD-V70.0) 22)  Sarcoidosis  (ICD-135) 23)  Gerd  (ICD-530.81) 24)  Cholelithiasis  (ICD-574.20) 25)  Hypertension  (ICD-401.9) 26)  Carpal Tunnel Syndrome  (ICD-354.0) 27)  Colitis  (ICD-558.9) 28)  Sleep Apnea  (ICD-780.57) 29)  Common Migraine  (ICD-346.10) 30)  Crohn's Disease  (ICD-555.9)  Current Problems (verified): 1)  Hyperlipidemia, Borderline  (ICD-272.4) 2)  Dizziness, Chronic  (ICD-780.4) 3)  Cough Variant Asthma  (ICD-493.82) 4)  Chest Discomfort  (ICD-786.59) 5)  Back Pain, Lumbar, With Radiculopathy  (ICD-724.4) 6)  Unspecified Sinusitis  (ICD-473.9) 7)  Cough  (ICD-786.2) 8)  Leg Pain  (ICD-729.5)  9)  Degenerative Disc Disease, Cervical Spine  (ICD-722.4) 10)  Rectal Bleeding  (ICD-569.3) 11)  Constipation  (ICD-564.00) 12)  Diarrhea  (ICD-787.91) 13)  Cerebral Aneurysm  (ICD-437.3) 14)  Anal Fissure  (ICD-565.0) 15)  Hiatal Hernia  (ICD-553.3) 16)  Asthmatic Bronchitis, Acute  (ICD-466.0) 17)  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 1 At Bedtime As Needed Sleep 2)  Innopran Xl 80 Mg  Cp24 (Propranolol Hcl Sr Beads) .Marland Kitchen.. 1 Capsule Po Once Daily 3)  Adult Aspirin Ec Low Strength 81 Mg  Tbec (Aspirin) .Marland Kitchen.. 1 Tablet Po Once Daily 4)  Fioricet 50-325-40 Mg  Tabs (Butalbital-Apap-Caffeine) .Marland Kitchen.. 1 Every 4-6 Hours As Needed Headache 5)  Prilosec Otc 20 Mg Tbec (Omeprazole Magnesium) .Marland Kitchen.. 1 Once Daily 6)  Norvasc 2.5 Mg Tabs (Amlodipine Besylate) .Marland Kitchen.. 1 Once Daily 7)  Clonidine Hcl 0.1 Mg Tabs (Clonidine Hcl) .... 1/2 Tab By Mouth As Needed For Symptomatic  Bp Greater Than 170 8)  Multivitamins  Caps (Multiple Vitamin) .Marland Kitchen.. 1 Once Daily 9)  Flaxseed Oil 1000 Mg Caps (Flaxseed (Linseed)) .Marland Kitchen.. 1 Once Daily 10)  Allegra 180 Mg Tabs (Fexofenadine Hcl) .... One By Mouth Daily As Needed 11)  Atuss Ds 30-4-30 Mg/42m Susp (Pseudoephed Hcl-Cpm-Dm Hbr Tan) .... As Directed 12)  Flexeril 10 Mg Tabs (Cyclobenzaprine Hcl) ..Marland Kitchen. 1 Once Daily  Current Medications (verified): 1)  Halcion 0.25 Mg  Tabs (Triazolam) ..Marland Kitchen. 1 At Bedtime As Needed Sleep 2)  Innopran Xl 80 Mg  Cp24 (Propranolol Hcl Sr Beads) ..Marland Kitchen. 1 Capsule Po Once Daily 3)  Adult Aspirin Ec Low Strength 81 Mg  Tbec (Aspirin) ..Marland Kitchen. 1 Tablet Po Once Daily 4)  Fioricet 50-325-40 Mg  Tabs (Butalbital-Apap-Caffeine) ..Marland Kitchen. 1 Every 4-6 Hours As Needed Headache 5)  Prilosec Otc 20 Mg Tbec (Omeprazole Magnesium) ..Marland Kitchen. 1 Once Daily 6)  Norvasc 2.5 Mg Tabs (Amlodipine Besylate) ..Marland Kitchen. 1 Once Daily 7)  Clonidine Hcl 0.1 Mg Tabs (Clonidine Hcl) .... 1/2 Tab By Mouth As Needed  For Symptomatic  Bp Greater Than 170 8)  Flaxseed Oil 1000 Mg Caps (Flaxseed (Linseed)) ..Marland Kitchen. 1 Once Daily 9)  Allegra 180 Mg Tabs (Fexofenadine Hcl) .... One By Mouth Daily As Needed 10)  Flexeril 10 Mg Tabs (Cyclobenzaprine Hcl) ..Marland Kitchen. 1 Once Daily 11)  Crestor 20 Mg Tabs (Rosuvastatin Calcium) ..Marland Kitchen. 1 Every Week  Allergies (verified): 1)  Demerol (Meperidine Hcl) 2)  Penicillin G Potassium (Penicillin G Potassium)  Past History:  Family History: Last updated: 04/30/2008 n/a Family History of Esophageal Cancer:father Family History of Stomach Cancer:father  Social History: Last updated: 04/05/2007 Retired Married Never Smoked Alcohol use-yes  Risk Factors: Smoking Status: never (10/04/2010) Passive Smoke Exposure: no (10/04/2010)  Past medical, surgical, family and social histories (including risk factors) reviewed, and no changes noted (except as noted below).  Past Medical History: Reviewed history from 04/05/2007 and no changes required. Hypertension hx of aneurysm in brain 1971 hx of right intracranial hemorrage crohns hx of hemicolectomy  1981 sleep apnea GERD  Past Surgical History: Reviewed history from 04/29/2008 and no changes required. breast lumectomy Colectomy 1981 right Carpal tunnel release Hysterectomy 1983 Tonsillectomy  Family History: Reviewed history from 04/30/2008 and no changes required. n/a Family History of Esophageal Cancer:father Family History of Stomach Cancer:father  Social History: Reviewed history from 04/05/2007 and no changes required. Retired Married Never Smoked Alcohol use-yes  Review of Systems  The patient denies anorexia, fever, weight loss, weight gain, vision loss, decreased hearing, hoarseness, chest pain, syncope, dyspnea on exertion, peripheral edema, prolonged cough, headaches, hemoptysis, abdominal pain, melena, hematochezia, severe indigestion/heartburn, hematuria, incontinence, genital sores, muscle weakness,  suspicious skin lesions, transient blindness, difficulty walking, depression, unusual weight change, abnormal bleeding, enlarged lymph nodes, angioedema, and breast masses.    Physical Exam  General:  Well-developed,well-nourished,in no acute distress; alert,appropriate and cooperative throughout examination Head:  normocephalic and atraumatic.   Eyes:  pupils equal and pupils round.   Ears:  R ear normal and L ear normal.   Mouth:  posterior lymphoid hypertrophy and postnasal drip.   Neck:  No deformities, masses, or tenderness noted. Lungs:  Normal respiratory effort, chest expands symmetrically. Lungs are clear to auscultation, no crackles or wheezes. Heart:  Normal rate and regular rhythm. S1 and S2 normal without gallop, murmur, click, rub or other extra sounds. Abdomen:  Soft, nontender and nondistended. No masses, hepatosplenomegaly or hernias noted. Normal bowel sounds. Msk:  No deformity or scoliosis noted of thoracic or lumbar spine.   Pulses:  R and L carotid,radial,femoral,dorsalis pedis and posterior tibial pulses are full and equal bilaterally Extremities:  No clubbing, cyanosis, edema, or deformity noted  Neurologic:  cranial nerves II-XII intact and gait normal.     Impression & Recommendations:  Problem # 1:  HYPERLIPIDEMIA, BORDERLINE (ICD-272.4) Assessment Improved success with the pulse crestor Her updated medication list for this problem includes:    Crestor 20 Mg Tabs (Rosuvastatin calcium) .Marland Kitchen... 1 every week  Labs Reviewed: SGOT: 17 (09/27/2010)   SGPT: 18 (09/27/2010)  10 Yr Risk Heart Disease: 5 % Prior 10 Yr Risk Heart Disease: 9 % (09/15/2009)   HDL:70.60 (09/27/2010), 54.50 (06/22/2010)  LDL:92 (09/27/2010), 113 (11/23/2008)  Chol:185 (09/27/2010), 201 (06/22/2010)  Trig:113.0 (09/27/2010), 116.0 (06/22/2010)  Problem # 2:  HYPERTENSION (ICD-401.9) Assessment: Unchanged labile htn with response to salt and anxiety takes as needed clinidine with good  result Her updated medication list for this problem includes:    Innopran Xl 80 Mg Cp24 (Propranolol hcl sr beads) .Marland Kitchen... 1 capsule po once daily    Norvasc 2.5 Mg Tabs (Amlodipine besylate) .Marland Kitchen... 1 once daily    Clonidine Hcl 0.1 Mg Tabs (Clonidine hcl) .Marland Kitchen... 1/2 tab by mouth as needed for symptomatic  bp greater than 170  Problem # 3:  BACK PAIN, LUMBAR, WITH RADICULOPATHY (ICD-724.4)  the numbness in the leg has increased and the pt has an MRI scheduled Her updated medication list for this problem includes:    Adult Aspirin Ec Low Strength 81 Mg Tbec (Aspirin) .Marland Kitchen... 1 tablet po once daily    Fioricet 50-325-40 Mg Tabs (Butalbital-apap-caffeine) .Marland Kitchen... 1 every 4-6 hours as needed headache    Flexeril 10 Mg Tabs (Cyclobenzaprine hcl) .Marland Kitchen... 1 once daily  Discussed use of moist heat or ice, modified activities, medications, and stretching/strengthening exercises. Back care instructions given. To be seen in 2 weeks if no improvement; sooner if worsening of symptoms.   Problem # 4:  CROHN'S DISEASE (ICD-555.9) stable  Complete Medication List:  1)  Halcion 0.25 Mg Tabs (Triazolam) .Marland Kitchen.. 1 at bedtime as needed sleep 2)  Innopran Xl 80 Mg Cp24 (Propranolol hcl sr beads) .Marland Kitchen.. 1 capsule po once daily 3)  Adult Aspirin Ec Low Strength 81 Mg Tbec (Aspirin) .Marland Kitchen.. 1 tablet po once daily 4)  Fioricet 50-325-40 Mg Tabs (Butalbital-apap-caffeine) .Marland Kitchen.. 1 every 4-6 hours as needed headache 5)  Prilosec Otc 20 Mg Tbec (Omeprazole magnesium) .Marland Kitchen.. 1 once daily 6)  Norvasc 2.5 Mg Tabs (Amlodipine besylate) .Marland Kitchen.. 1 once daily 7)  Clonidine Hcl 0.1 Mg Tabs (Clonidine hcl) .... 1/2 tab by mouth as needed for symptomatic  bp greater than 170 8)  Flaxseed Oil 1000 Mg Caps (Flaxseed (linseed)) .Marland Kitchen.. 1 once daily 9)  Allegra 180 Mg Tabs (Fexofenadine hcl) .... One by mouth daily as needed 10)  Flexeril 10 Mg Tabs (Cyclobenzaprine hcl) .Marland Kitchen.. 1 once daily 11)  Crestor 20 Mg Tabs (Rosuvastatin calcium) .Marland Kitchen.. 1 every  week  Hypertension Assessment/Plan:      The patient's hypertensive risk group is category C: Target organ damage and/or diabetes.  Her calculated 10 year risk of coronary heart disease is 5 %.  Today's blood pressure is 124/74.  Her blood pressure goal is < 140/90.  Patient Instructions: 1)  Please schedule a follow-up appointment in 3 months. Prescriptions: CLONIDINE HCL 0.1 MG TABS (CLONIDINE HCL) 1/2 tab by mouth as needed for symptomatic  BP greater than 170  #45 x 1   Entered by:   Allyne Gee, LPN   Authorized by:   Ricard Dillon MD   Signed by:   Allyne Gee, LPN on 16/96/7893   Method used:   Print then Give to Patient   RxID:   [id number] CRESTOR 20 MG TABS (ROSUVASTATIN CALCIUM) 1 every week  #15 x 3   Entered by:   Allyne Gee, LPN   Authorized by:   Ricard Dillon MD   Signed by:   Allyne Gee, LPN on 24/23/5361   Method used:   Print then Give to Patient   RxID:   [id number] INNOPRAN XL 80 MG  CP24 (PROPRANOLOL HCL SR BEADS) 1 capsule po once daily  #90 x 3   Entered by:   Allyne Gee, LPN   Authorized by:   Ricard Dillon MD   Signed by:   Allyne Gee, LPN on 93/26/7124   Method used:   Print then Give to Patient   RxID:   [id number] NORVASC 2.5 MG TABS (AMLODIPINE BESYLATE) 1 once daily  #90 x 3   Entered by:   Allyne Gee, LPN   Authorized by:   Ricard Dillon MD   Signed by:   Allyne Gee, LPN on 67/34/1937   Method used:   Print then Give to Patient   RxID:   [id number] HALCION 0.25 MG  TABS (TRIAZOLAM) 1 at bedtime as needed sleep  #90 x 1   Entered by:   Allyne Gee, LPN   Authorized by:   Ricard Dillon MD   Signed by:   Allyne Gee, LPN on 68/34/1962   Method used:   Print then Give to Patient   RxID:   [id number]    Orders Added: 1)  Est. Patient Level IV [81448]

## 2010-10-19 ENCOUNTER — Ambulatory Visit (INDEPENDENT_AMBULATORY_CARE_PROVIDER_SITE_OTHER): Payer: Medicare Other | Admitting: Internal Medicine

## 2010-10-19 ENCOUNTER — Encounter: Payer: Self-pay | Admitting: Internal Medicine

## 2010-10-19 DIAGNOSIS — I10 Essential (primary) hypertension: Secondary | ICD-10-CM

## 2010-10-19 DIAGNOSIS — F329 Major depressive disorder, single episode, unspecified: Secondary | ICD-10-CM

## 2010-10-19 DIAGNOSIS — F341 Dysthymic disorder: Secondary | ICD-10-CM

## 2010-10-19 DIAGNOSIS — G43009 Migraine without aura, not intractable, without status migrainosus: Secondary | ICD-10-CM

## 2010-10-19 DIAGNOSIS — F32A Depression, unspecified: Secondary | ICD-10-CM | POA: Insufficient documentation

## 2010-10-19 DIAGNOSIS — IMO0002 Reserved for concepts with insufficient information to code with codable children: Secondary | ICD-10-CM

## 2010-10-19 NOTE — Patient Instructions (Signed)
When the blood pressure becomes elevated follow the same pattern you are using a taking a half of the clonidine and monitoring your blood pressure if the blood pressure continues to rise take an additional half clonidine if it continues to rise after that he should go to the emergency room if the blood pressure is stable or slightly decreased then monitor your blood pressure in another half hour before taking an additional dose. You'll begin Cymbalta 30 mg one by mouth daily for the next month and followup with [place] in that period of time. The Cymbalta should help both your back pain as well as her anxiety if he developed confusion or clouded sensorium

## 2010-10-19 NOTE — Progress Notes (Signed)
Subjective:    Patient ID: Julia [name], female    DOB: Jul 23, 1951, 60 y.o.   MRN: [id number]  HPI    Review of Systems  Constitutional: Positive for activity change and fatigue. Negative for appetite change.  HENT: Negative for ear pain, congestion, neck pain, postnasal drip and sinus pressure.   Eyes: Negative for redness and visual disturbance.  Respiratory: Negative for cough, shortness of breath and wheezing.   Gastrointestinal: Negative for abdominal pain and abdominal distention.  Genitourinary: Negative for dysuria, frequency and menstrual problem.  Musculoskeletal: Negative for myalgias, joint swelling and arthralgias.  Skin: Negative for rash and wound.  Neurological: Positive for weakness, light-headedness, numbness and headaches. Negative for dizziness.  Hematological: Negative for adenopathy. Does not bruise/bleed easily.  Psychiatric/Behavioral: Positive for confusion and dysphoric mood. Negative for sleep disturbance and decreased concentration.       I have reviewed the patient's past medical history social history surgical history problem list allergies and medication list Objective:   Physical Exam  Constitutional: She is oriented to person, place, and time. She appears well-developed and well-nourished. No distress.        The patient has no apparent distress he is blinking her eyes frequently and squinting at the time of examination  HENT:  Head: Normocephalic and atraumatic.  Right Ear: External ear normal.  Left Ear: External ear normal.  Nose: Nose normal.  Mouth/Throat: Oropharynx is clear and moist.  Eyes: Conjunctivae and EOM are normal. Pupils are equal, round, and reactive to light.  Neck: Normal range of motion. Neck supple. No JVD present. No tracheal deviation present. No thyromegaly present.  Cardiovascular: Normal rate, regular rhythm, normal heart sounds and intact distal pulses.   No murmur heard. Pulmonary/Chest: Effort normal and breath  sounds normal. She has no wheezes. She exhibits no tenderness.  Abdominal: Soft. Bowel sounds are normal.  Musculoskeletal: Normal range of motion. She exhibits no edema and no tenderness.  Lymphadenopathy:    She has no cervical adenopathy.  Neurological: She is alert and oriented to person, place, and time. She has normal reflexes. No cranial nerve deficit.        There are no focal neurologic findings equal grips no speech pattern variations or slurred speech is detected  Skin: Skin is warm and dry. She is not diaphoretic.  Psychiatric: She has a normal mood and affect. Her behavior is normal.          Assessment & Plan:   one. Accelerated hypertension the patient has multifactorial accelerated hypertension at is related both to her current stress levels her anxiety over her back diagnoses as well as over the care of her elderly mother and the pain from her lumbar disc.   She has a history of labile hypertension that has responded to other stressors in the past with elevations as seen in this case.   we have given her careful instructions and the patient discharged sectioned for how to use her clonidine for control of her blood pressure and we'll place her on Cymbalta 30 mg by mouth daily to treat the underlying anxiety.   2. Reactive depression this is the first time that this patient has admitted her depression as well as agreed to take medication for this because of the back pain we will use Cymbalta do to its dual indications and will place her on 30 mg daily for 4 weeks with followup in 4 weeks the patient was instructed of the possible side effects of Cymbalta  and notified to call our office should she be intolerant of this medication.   should the blood pressure worsened should her pain worsen or any of the symptoms become alarming she was instructed to go to the emergency room without

## 2010-11-16 ENCOUNTER — Other Ambulatory Visit: Payer: Self-pay | Admitting: Internal Medicine

## 2010-11-16 DIAGNOSIS — F329 Major depressive disorder, single episode, unspecified: Secondary | ICD-10-CM

## 2010-11-16 DIAGNOSIS — F32A Depression, unspecified: Secondary | ICD-10-CM

## 2010-11-16 MED ORDER — DULOXETINE HCL 30 MG PO CPEP: 30.0000 mg | ORAL_CAPSULE | Freq: Every day | ORAL | Status: DC

## 2010-11-16 NOTE — Telephone Encounter (Signed)
May call in a 30 day supply One po dialy

## 2010-11-16 NOTE — Telephone Encounter (Signed)
Triage vm------was given samples of Cymbalta 23m 1qd. [facility]--[phone].

## 2010-11-23 ENCOUNTER — Ambulatory Visit: Payer: Medicare Other | Admitting: Internal Medicine

## 2010-11-24 LAB — COMPREHENSIVE METABOLIC PANEL
ALT: 19 U/L (ref 0–35)
AST: 25 U/L (ref 0–37)
Alkaline Phosphatase: 72 U/L (ref 39–117)
BUN: 16 mg/dL (ref 6–23)
Calcium: 9.1 mg/dL (ref 8.4–10.5)
Chloride: 109 mEq/L (ref 96–112)
Creatinine, Ser: 0.87 mg/dL (ref 0.4–1.2)
GFR calc non Af Amer: >60 mL/min (ref >60)

## 2010-11-24 LAB — TROPONIN I
Troponin I: 0.02 ng/mL (ref 0.00–0.06)
Troponin I: <0.01 ng/mL (ref 0.00–0.06)

## 2010-11-24 LAB — PROTIME-INR: INR: 0.89 (ref 0.00–1.49)

## 2010-11-24 LAB — CK TOTAL AND CKMB (NOT AT ARMC)
CK, MB: 1.9 ng/mL (ref 0.3–4.0)
CK, MB: 2.8 ng/mL (ref 0.3–4.0)
Relative Index: 2.7 — ABNORMAL HIGH (ref 0.0–2.5)
Relative Index: INVALID (ref 0.0–2.5)
Total CK: 104 U/L (ref 7–177)

## 2010-11-24 LAB — URINALYSIS, ROUTINE W REFLEX MICROSCOPIC
Glucose, UA: NEGATIVE mg/dL
Ketones, ur: NEGATIVE mg/dL
Nitrite: NEGATIVE
Specific Gravity, Urine: 1.005 (ref 1.005–1.030)
pH: 7.5 (ref 5.0–8.0)

## 2010-11-24 LAB — LIPID PANEL
Cholesterol: 201 mg/dL — ABNORMAL HIGH (ref 0–200)
HDL: 54 mg/dL (ref >39)
LDL Cholesterol: 102 mg/dL — ABNORMAL HIGH (ref 0–99)
Total CHOL/HDL Ratio: 3.7 RATIO
Triglycerides: 226 mg/dL — ABNORMAL HIGH (ref <150)

## 2010-11-24 LAB — CBC
HCT: 43.4 % (ref 36.0–46.0)
Hemoglobin: 14.2 g/dL (ref 12.0–15.0)
MCV: 90.2 fL (ref 78.0–100.0)
WBC: 5 10*3/uL (ref 4.0–10.5)

## 2010-11-24 LAB — URINE CULTURE
Colony Count: 5000
Culture  Setup Time: 201109121000

## 2010-11-24 LAB — DIFFERENTIAL
Basophils Relative: 0 % (ref 0–1)
Eosinophils Absolute: 0.2 10*3/uL (ref 0.0–0.7)
Eosinophils Relative: 3 % (ref 0–5)
Monocytes Relative: 11 % (ref 3–12)
Neutrophils Relative %: 50 % (ref 43–77)

## 2010-12-01 ENCOUNTER — Encounter: Payer: Self-pay | Admitting: Internal Medicine

## 2010-12-01 ENCOUNTER — Other Ambulatory Visit: Payer: Self-pay | Admitting: *Deleted

## 2010-12-01 MED ORDER — ROSUVASTATIN CALCIUM 20 MG PO TABS: 20.0000 mg | ORAL_TABLET | ORAL | Status: DC

## 2010-12-19 ENCOUNTER — Other Ambulatory Visit: Payer: Self-pay | Admitting: Internal Medicine

## 2011-01-09 ENCOUNTER — Ambulatory Visit (INDEPENDENT_AMBULATORY_CARE_PROVIDER_SITE_OTHER): Payer: Medicare Other | Admitting: Internal Medicine

## 2011-01-09 ENCOUNTER — Encounter: Payer: Self-pay | Admitting: Internal Medicine

## 2011-01-09 VITALS — BP 130/80 | HR 76 | Temp 98.2°F | Resp 14 | Ht 62.0 in | Wt 150.0 lb

## 2011-01-09 DIAGNOSIS — K219 Gastro-esophageal reflux disease without esophagitis: Secondary | ICD-10-CM

## 2011-01-09 DIAGNOSIS — I10 Essential (primary) hypertension: Secondary | ICD-10-CM

## 2011-01-09 LAB — BASIC METABOLIC PANEL
CO2: 30 mEq/L (ref 19–32)
Chloride: 99 mEq/L (ref 96–112)
Creatinine, Ser: 0.9 mg/dL (ref 0.4–1.2)
Potassium: 4.2 mEq/L (ref 3.5–5.1)
Sodium: 136 mEq/L (ref 135–145)

## 2011-01-09 MED ORDER — ASPIRIN 81 MG PO TABS: 81.0000 mg | ORAL_TABLET | Freq: Every day | ORAL | Status: DC

## 2011-01-09 NOTE — Progress Notes (Signed)
Subjective:    Patient ID: Julia [name], female    DOB: 04-27-1951, 60 y.o.   MRN: [id number]  HPI Patient presented for followup of hypertension with only 2 episodes of elevated blood pressure since her last visit generally she has been well controlled she also presents for followup of the disorder on Cymbalta doing well on low dose 30 mg. And reflux symptoms and heartburn with her aspirin therapy on 81 mg a day.   Review of Systems  Constitutional: Negative for activity change, appetite change and fatigue.  HENT: Negative for ear pain, congestion, neck pain, postnasal drip and sinus pressure.   Eyes: Negative for redness and visual disturbance.  Respiratory: Negative for cough, shortness of breath and wheezing.   Gastrointestinal: Negative for abdominal pain and abdominal distention.  Genitourinary: Negative for dysuria, frequency and menstrual problem.  Musculoskeletal: Negative for myalgias, joint swelling and arthralgias.  Skin: Negative for rash and wound.  Neurological: Negative for dizziness, weakness and headaches.  Hematological: Negative for adenopathy. Does not bruise/bleed easily.  Psychiatric/Behavioral: Negative for sleep disturbance and decreased concentration.   Past Medical History  Diagnosis Date  . Anal fissure 04/29/2008  . BACK PAIN, LUMBAR, WITH RADICULOPATHY 09/21/2008  . BREAST CYSTS, BILATERAL 10/01/2007  . Carpal tunnel syndrome 04/05/2007  . 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 CHEST DISCOMFORT 10/06/2008  . CHOLELITHIASIS 04/05/2007  . COLITIS 04/05/2007  . COMMON MIGRAINE 04/05/2007  . CONSTIPATION 04/29/2008  . Cough variant asthma 09/15/2009  . CROHN'S DISEASE 04/05/2007  . 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 Diarrhea 04/29/2008  . DIZZINESS, CHRONIC 10/26/2009  . FACIAL PARESTHESIA, LEFT 11/04/2007  . GERD 04/05/2007  . HIATAL HERNIA 04/29/2008  . [place], [place] 06/29/2010  . HYPERTENSION 04/05/2007  . 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 Sarcoidosis 04/05/2007  . SLEEP APNEA 04/05/2007  . UNSPECIFIED VENOUS INSUFFICIENCY 11/04/2007   Past Surgical History  Procedure Date  . Breast lumpectomy   . Colectomy   . Carpel tunnel   . Abdominal hysterectomy   . Tonsillectomy     reports that she quit smoking about 31 years ago. She does not have any smokeless tobacco history on file. She reports that she does not drink alcohol or use illicit drugs. family history includes Cancer in her father. Allergies  Allergen Reactions  . Meperidine Hcl     REACTION: vomiting  . Penicillins     REACTION: rash       Objective:   Physical Exam  Constitutional: She is oriented to person, place, and time. She appears well-developed and well-nourished. No distress.  HENT:  Head: Normocephalic and atraumatic.  Right Ear: External ear normal.  Left Ear: External ear normal.  Nose: Nose normal.  Mouth/Throat: Oropharynx is clear and moist.  Eyes: Conjunctivae and EOM are normal. Pupils are equal, round, and reactive to light.  Neck: Normal range of motion. Neck supple. No JVD present. No tracheal deviation present. No thyromegaly present.  Cardiovascular: Normal rate, regular rhythm, normal heart sounds and intact distal pulses.   No murmur heard. Pulmonary/Chest: Effort normal and breath sounds normal. She has no wheezes. She exhibits no tenderness.  Abdominal: Soft. Bowel sounds are normal.  Musculoskeletal: Normal range of motion. She exhibits no edema and no tenderness.  Lymphadenopathy:    She has no cervical adenopathy.  Neurological: She is alert and oriented to person, place, and time. She has normal reflexes. No cranial nerve deficit.  Skin: Skin is warm and dry. She is not  diaphoretic.  Psychiatric: She has a normal mood and affect. Her behavior is normal.          Assessment & Plan:

## 2011-01-09 NOTE — Assessment & Plan Note (Signed)
Do to her history of gastritis and reflux and the fact that when she takes the aspirin on a daily basis she notes that she is free bleeding when she is cut or hemorrhoid we will reduce her aspirin to Monday Wednesday Friday

## 2011-01-09 NOTE — Assessment & Plan Note (Signed)
Patient's blood pressure has been reasonably stable since her last visit she has had 2 occasions which she has had to take an extra clonidine to control her blood pressure

## 2011-01-24 NOTE — Consult Note (Signed)
NAMEALEXXUS, SOBH             ACCOUNT NO.:  [id number]   MEDICAL RECORD NO.:  [id number]          PATIENT TYPE:  INP   LOCATION:  4706                         FACILITY:  [facility]   PHYSICIAN:  Satira Sark, MD DATE OF BIRTH:  1951-06-18   DATE OF CONSULTATION:  05/10/2008  DATE OF DISCHARGE:                                 CONSULTATION   REQUESTING PHYSICIAN:  Dr. Gwendolyn Grant.   PRIMARY CARE PHYSICIAN:  Dr. Benay Pillow.   REASON FOR CONSULTATION:  Chest pain.   HISTORY OF PRESENT ILLNESS:  Ms. Hilscher is a 60 year old woman with a  history of hypertension and a previous right parietal intracranial  hemorrhage back in October 2007, as well as a baseline history of  recurrent migraine headaches.  She is admitted to the hospital now with  recurrent headaches and chest pain.  Specifically, she tells me that  this past Thursday she was experiencing a headache, describing a  pressure sensation in her head as if she was under water followed  ultimately by the sensation of chest heaviness and a pressure that  was moderate-to-severe in intensity.  She denies having any clearly  exertional component to her chest pain and in retrospect states that she  has had intermittent episodes of chest pain sometimes with head ache  ever since she had her intracranial hemorrhage back in 2007.  She also  tells me that she underwent a chemical stress test in 2007 or 2008,  ordered by Dr. Arnoldo Morale due to chest pain, and tells me that this study  was normal.  She has had involuntary hand movements with some of her  headaches recently and has manifested recurrent chest pain while she has  been the hospital under observation.  Fortunately, her electrocardiogram  has been essentially normal, and her cardiac markers have also been  entirely normal.   She has been seen in consultation by neurology given her history and  also a description of recurrent left-sided numbness which the patient  reports has also been a fairly chronic problem since 2007.  She was  referred for an MRA/MRI of the brain, and this study revealed no acute  intracranial abnormality with evidence of a remote right occipital  infarct with evidence for remote blood products and an unremarkable  Crcle of Willis.  Neurology has followed up on the patient, and  essentially signed off with recommendations for treatment of her  headaches that are felt to have migraine features.  We have been asked  to comment on her chest pain.  Ms. Kushnir has been treated with a  number of pain medications and is somewhat sedated at this time.  She  does give a fairly clear history, however.  Despite her prolonged  episodes of chest pain, she has not manifested any abnormal cardiac  markers.   ALLERGIES:  PENICILLIN.   PRESENT MEDICATIONS:  1. Norvasc 2.5 mg p.o. daily.  2. Aspirin 325 mg p.o. daily.  3. Toradol 30 mg IV p.r.n.  4. Pentasa 1000 mg p.o. q.h.s.  5. Benicar 5 mg p.o. q.h.s.  6. Protonix 40 mg p.o. b.i.d.  7. Inderal 80 mg p.o. daily.  8. Topamax 25 mg p.o. q.h.s.  9. She is also written for Phenergan, morphine, Vicodin and Fioricet      p.r.n.   PAST MEDICAL HISTORY:  Outlined above.  Additional problems include:  1. 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  2. Right carpal tunnel release.  3. Tonsillectomy.  4. Hysterectomy.  5. Hypertension.  6. Aneurysm with hemorrhage approximately 30 years ago (details not      clear).  7. Sleep apnea.  8. Gastroesophageal reflux disease.  9. A previously noted benign breast lump.  10.I see no clearly documented history of obstructive coronary disease      or myocardial infarction.   SOCIAL HISTORY:  The patient lives in [place] with her husband.  She  has a remote tobacco use history but quit approximately 30-35 years ago.  No alcohol use or other illicit substance use.   FAMILY HISTORY:  Was reviewed and is noncontributory at the  present  time.  No obvious premature cardiovascular disease described.   REVIEW OF SYSTEMS:  As outlined above.  No hemoptysis, cough, fevers or  chills.  She reports chronic problems with decrease in peripheral  vision, a drunk sensation chronically, intermittent recurrent left-  sided numbness since her stroke in 2007, no reproducible exertional  chest pain.  She does have occasional shortness of breath with activity.  She had no palpitations or syncope.  She has experienced some nausea  recently as well.  Otherwise systems are negative.   EXAMINATION:  Temperature is 97.7 degrees, heart rate is 74,  respirations 18, blood pressure recently ranging from 125-145/72-101  oxygen  saturation 93-100%.  The patient is normally-nourished-appearing, in no acute distress.  HEENT:  Conjunctivae was normal.  Pharynx clear.  NECK:  Supple.  No elevated jugular venous pressure.  No loud bruits or  thyromegaly was noted.  LUNGS:  Clear with diminished breath sounds.  CARDIAC:  Regular rate and rhythm.  No pathologic murmur or S3 gallop,  no pericardial rub.  ABDOMEN:  Soft, nontender.  Normoactive bowel sounds.  EXTREMITIES:  No frank pitting edema.  Distal pulses are 2+.  SKIN:  Warm and dry.  MUSCULOSKELETAL:  No kyphosis noted.  NEUROPSYCHIATRIC:  The patient alert and oriented x3.  She reports mild  numbness on the left side.  Overall, a symmetric motor tone.   LABORATORY DATA:  D-dimer 0.38, troponin-I normal x3.  CK-MB normal x3.  BNP 269, TSH 2.69.  Sodium 1.3, potassium 4.0, BUN 15, creatinine 0.7,  INR 0.9, WBCs 5.0, hemoglobin 13.5, hematocrit 40.1, platelets 179.   Chest x-ray from August 28 reveals no acute abnormality.   IMPRESSION:  1. Episodic chest pain as outlined above.  There is no clear relation      to exertion, and Ms. Voong indicates that she has had recurrent      episodes of chest discomfort similar to this in sporadic fashion,      sometimes in association with  her headaches, ever since she had her      intracranial bleed back in 2007.  With concurrent history of      migraine headaches, one wonders about possible vasospasm as an      etiology, although she has not manifested any acute ST elevation,      and her cardiac markers are entirely normal despite prolonged      episodes of chest pain.  She reports undergoing a previous  chemical stress test within the last 2 years and states that this      was normal.  She has been hemodynamically stable, if anything,      hypertensive.  Medical therapy is being adjusted by the primary      team.  2. History of right parietal intracranial hemorrhage in the setting of      hypertension back in 2007.  The patient has had a neurology consult      during this hospital stay, and her a MRA/MRI the brain shows stable      findings.  She also sees a neurologist at [facility].  3. Normal D-dimer level argues against pulmonary embolus as an      etiology for the patient's chest pain.  4. Hypertension.  5. Gastroesophageal reflux disease.  The patient has been placed on      twice daily Protonix in case there is a gastrointestinal etiology      to her symptoms.  6. Uncertain lipid status.   RECOMMENDATIONS:  I discussed the situation in detail with the patient.  I would continue aspirin for now.  We will arrange a followup 2-D  echocardiogram to exclude focal wall motion abnormalities as well as a  followup electrocardiogram and set of cardiac markers tomorrow morning.  I would also check a fasting lipid profile.  If her objective studies  remain completely reassuring, we may be able to have her follow up for  an outpatient Myoview and office visit after she otherwise stabilizes  from a neurological/migraine headache perspective.  I do not anticipate  an invasive cardiac workup at this point.  Our service will continue to  follow with you.      Satira Sark, MD  Electronically Signed     SGM/MEDQ   D:  05/10/2008  T:  05/10/2008  Job:  [id number]   cc:   Mateo Flow A. Asa Lente, MD  Ricard Dillon, MD

## 2011-01-24 NOTE — Assessment & Plan Note (Signed)
[facility] OFFICE NOTE   TAYDEN, DURAN                  MRN:          [id number]  DATE:11/05/2007                            DOB:          July 14, 1951    Mrs. Halperin is a 60 year old, white female that I have followed for  many years with ileocecal Crohn's disease requiring right hemicolectomy  many years ago.  Over the years she has suffered from recurrent anal  fissures, but has really done well on low dose immuno salicylate  therapy.  Her last colonoscopy exam was performed in September of 2004,  at which time she had some mild anastomotic disease with some mild  stenosis of her anastomosis, but she otherwise was really doing fairly  well, and last had a negative CT scan of her abdomen in March of 2000  except for some scarring at the anastomosis.  Small bowel series in the  past have been unremarkable.   Unfortunately, this lady has a history of cerebral aneurysms and had a  parietal intracranial hemorrhage secondary to hypertension in October of  2007 requiring hospitalization at [facility] requiring medical care  and no surgical intervention.  She was discharged in followup by Dr.  Leonie Man and Dr. Benay Pillow.  She has been on multiple medications  including Topamax 25 mg daily, InnoPran XL 80 mg daily, Atacand 4 mg  daily, aspirin 81 mg daily, and over-the-counter Prilosec, potassium 1  gram twice a day.  She apparently uses p.r.n. Lunesta and Cafergot along  with p.r.n. Tylenol migraine.   She, in the past, has had reactions to Callender.   The patient comes to the office today complaining of alternating  diarrhea and constipation with lower abdominal gas and bloating with a  couple episodes of small amounts of rectal bleeding.  She describes a  dry mouth without true reflux symptoms, dysphagia, or any specific  hepatobiliary complaints.  Her appetite is good and her  weight has been  stable.  She really denies severe diarrhea, steatorrhea, significant  bleeding, fever, chills or other systemic complaints.  On reviewing her  record, she has had vague abdominal gas, bloating, and diarrhea for  several years, but has not been felt to have significant relapses of her  inflammatory bowel disease.  At one point, she was treated for C  difficile colitis.   EXAMINATION:  Exam today shows her to be a healthy appearing white  female in no acute distress.  She weighs 140 pounds, which is her normal  weight.  Blood pressure 144/86 and pulse was 64 and regular.  ABDOMEN:  Showed no organomegaly, masses, distension and only a slight  tenderness to deep palpation in the left lower quadrant area.  There  were no definite masses.  Bowel sounds were normal.  Inspection of rectum showed no fissure or fistulae, and rectal exam  showed no masses or tenderness with soft stool that was guaiac negative.   ASSESSMENT:  1. History of Crohn's disease with chronic stenosis and narrowing of      her ileocolonic anastomosis with probable chronic low grade Crohn's  disease, from which she has really done well over the last several      years.  Her abdominal exam today certainly is not remarkable and      her rectal exam also is unremarkable with guaiac negative stools.      The patient obviously is not a good candidate for any type of      endoscopic examination which would cause hypertension or rises in      intracranial pressure.  2. Chronic gastroesophageal reflux disease on daily PPI therapy.  3. Status post right hemicolectomy without evidence of bowel symptoms      suggestive of bacterial overgrowth syndrome.  4. 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  5. History of penicillin allergy.   RECOMMENDATIONS:  1. Continue current medications at regular dose.  2. Outpatient CT scan of the abdomen and pelvis.  3. Basic screening laboratory  parameters.  4. Continue other multiple medications as listed above.   ADDENDUM:  Apparently this patient has an appointment to see some  different, new neurologists in [place] and scheduled MRI of her  head, which is currently pending.  B12 level was checked in March of  2008, and was normal at 470.     Loralee Pacas. Sharlett Iles, MD, [place], [place]  Electronically Signed    DRP/MedQ  DD: 11/05/2007  DT: 11/05/2007  Job #: [id number]   cc:   Ricard Dillon, MD

## 2011-01-24 NOTE — Procedures (Signed)
Julia [name], Julia [name]             ACCOUNT NO.:  [id number]   MEDICAL RECORD NO.:  [id number]          PATIENT TYPE:  OUT   LOCATION:  SLEEP CENTER                 FACILITY[facility]  [facility]   PHYSICIAN:  Kathee Delton, MD,FCCPDATE OF BIRTH:  1951/08/03   DATE OF STUDY:  06/02/2008                            NOCTURNAL POLYSOMNOGRAM   REFERRING PHYSICIAN:   INDICATION FOR STUDY:  Hypersomnia with sleep apnea.   EPWORTH SLEEPINESS SCORE:  17.   MEDICATIONS:   SLEEP ARCHITECTURE:  Patient had a total sleep time of 407 minutes with  adequate slow-wave sleep but decreased REM.  Sleep onset latency was  normal at 29 minutes, and REM onset was normal at 99 minutes.  Sleep  efficiency was mildly decreased at 88%.   RESPIRATORY DATA:  The patient was found to have 2 obstructive apneas  and 9 obstructive hypopneas for an AHI of 2 events per hour.  The events  were not positional, and there was mild snoring noted.  Patient did not  meet split-night criteria secondary to the small numbers of events.   OXYGEN DATA:  There was transient O2 desaturation as low as 83% with the  patient's obstructive events.  She spent 18 minutes less than 88% during  the entire night, and this is not felt to be clinically significant.   CARDIAC DATA:  No clinically significant arrhythmias were noted.   MOVEMENT/PARASOMNIA:  Patient had no leg jerks or other abnormal  behaviors seen.   IMPRESSIONS/RECOMMENDATIONS:  Small number of obstructive events which  do not meet the AHI (apnea/hypopnea index) criteria for the obstructive  sleep apnea syndrome.  There was transient oxygen desaturation to 83%,  but not clinically significant.  If the patient is having unexplained  daytime sleepiness, further investigation is warranted.     Kathee Delton, MD,FCCP  [place], [place] Board of Sleep  Medicine  Electronically Signed    KMC/MEDQ  D:  06/16/2008 14:24:44  T:  06/16/2008 15:59:15  Job:  [id number]

## 2011-01-24 NOTE — Consult Note (Signed)
Julia [name], Julia [name]             ACCOUNT NO.:  [id number]   MEDICAL RECORD NO.:  [id number]          PATIENT TYPE:  INP   LOCATION:  4706                         FACILITY:  [facility]   PHYSICIAN:  Marcial Pacas, MD          DATE OF BIRTH:  July 08, 1951   DATE OF CONSULTATION:  DATE OF DISCHARGE:                                 CONSULTATION   REFERRING PHYSICIAN:  Valerie A. Asa Lente, MD   CHIEF COMPLAINT:  This is a consult from Dr. Gwendolyn Grant,  concerning this 60 year old female with headache, left-side numbness,  and new onset intermittent bilateral upper extremity more than lower  extremity jerking movements.   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   She reported a history of frequent migraine, holocranial severe headache  happened at least twice a week.  On Thursday, 2 days prior to  consultation, presenting with severe headache, and as if her head was  squeezing in a vice, went underwater, she felt that her ears were  stopped up.  The severe headache lasted about 30 seconds, followed by  occipital area pressure headache.  She describe headache is not unusual  for her migraine headaches.  The  unusual feature  is the fullness  sensation in her ear.  In same time, she felt left leg and upper  extremity numbness and chest tightness, pressure sensation, which has  prompted her admission.  Was reported to have elevated blood pressure  190/98 upon admission.  In the same time, she developed this  intermittent bilateral upper extremity more than lower extremity jerking  lasted few seconds.  There was no loss of consciousness.  She has no  control of that jerking movements.   The headache lasted until Friday morning, and so was  the left side  numbness.  She is essentially back to her normal self with exception of  mild bilateral occipital pressure headache 4 out of 10, and one more  episode of right or left arm involuntary brief jerking movement last  night.   She presented with acute onset of headache with elevated blood pressure,  left hemianopia in October 2007.  Imaging study has demonstrated acute  right parietal/occipital hemorrhagic stroke then.  She has persistent  left hemianopia.  In addition, she complains loopy feelings as if she  was drunk.  Has been taking care of by my partner, Dr. Leonie Man in the  past.  Recently, also seeking second opinion at the [facility].  Reportedly, recently had MRI of the brain and ultrasound of  the carotid artery, with no acute change.   REVIEW OF SYSTEMS:  She no longer have chest pain.  There was no new  lateralized motor or sensory deficit.   PAST MEDICAL HISTORY:  Crohn disease, hypertension, hemorrhagic stroke,  migraine headaches, GERD, and benign breast lump.   PAST SURGICAL HISTORY:  Bowel resection, hysterectomy, and carpal tunnel  release surgery.   FAMILY HISTORY:  Father died of lung cancer.   SOCIAL HISTORY:  She is no longer driving since her stroke in 2007, and  lives with her husband.  Remote tobacco use.  No alcohol.  No IV illicit  drugs.   MEDICATION ALLERGY:  PENICILLIN.   CURRENT MEDICATIONS:  Aspirin 81 mg every day, mesalamine 1000 mg every  night, Benicar 5 mg every night, Protonix, Enduron, Tylenol, Fioricet,  Vicodin, and Morphine p.r.n.   PHYSICAL EXAMINATION:  VITAL SIGNS:  Temperature 97.9, current heart  rate is 59, and blood pressure 105/65, the initial documented blood  pressure 163/108.  CARDIAC:  Regular rate and rhythm.  PULMONARY:  Clear to auscultation bilaterally.  NECK:  Supple.  No carotid bruits.  NEUROLOGICAL:  She is alert and oriented to history taking and casual  conversation.  Cranial nerves  II-XII. Pupils were equal, round, reactive  to light and accommodation.  Left fundi was sharp.  Extraocular  movements were full and she has left hemianopia.  Facial sensation and  strength was normal.  Uvula and tongue midline.  Head turning, shoulder  shrugging were normal and symmetric.  Tongue protrusion into cheek  strength was normal.  Motor examination: Normal tone, bulk, and  strength. Sensory normal to light touch, pinprick. There was no  extinction on double spontaenous stimulation. Deep tendon reflexes were  normal, symmetric. Plantar responses are flexor. Coordination: Normal  finger-to-nose, heel-to-shin.  Gait, cautious but fairly steady.   CT head without contrast revealed there was stable old right  parietooccipital stroke.  There was no acute lesion.   LABORATORY DATA:  Cardiac panel, negative troponin.  TSH was normal.  BNP increased to 69, CMP was normal, and CBC was normal.  INR was 0.9.   ASSESSMENT/PLAN:  A 60 year old with previous right occipital/parietal  hemorrhagic stroke, with residual left hemianopia, Crohn disease, had  migraine headaches, presenting with recurrent headache with associated  left-side numbness now back to baseline.  1. Left hemiparesis might suggestive of right thalamic lesion, and she      certainly have risk factor for a small vessel ischemic stroke.  For      that reason, we will order MRI of the brain without contrast.  2. The involuntary jerking movement less suggestive of a seizure,      because they are intermittent and involving both arms and legs      without loss of consciousness. Please continue to observe the      symptoms.  3. If there is no acute lesion in the MRI, she is to continue current      management to optimize blood pressure control, keep antiplatelet      agent.      Marcial Pacas, MD  Electronically Signed     YY/MEDQ  D:  05/09/2008  T:  05/10/2008  Job:  [id number]

## 2011-01-24 NOTE — Discharge Summary (Signed)
[name], [name]             ACCOUNT NO.:  [id number]   MEDICAL RECORD NO.:  [id number]          PATIENT TYPE:  OBV   LOCATION:  4706                         FACILITY:  [facility] [place]   PHYSICIAN:  Julia [name], MDDATE OF BIRTH:  02-07-1951   DATE OF ADMISSION:  05/07/2008  DATE OF DISCHARGE:  05/11/2008                               DISCHARGE SUMMARY   PRIMARY CARE PHYSICIAN:  Ricard Dillon, MD   DISCHARGE DIAGNOSES:  1. Chest pain with unclear etiology.  2. Migraine headaches.  3. History of hemorrhagic cerebrovascular accident.   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  The  patient presented to [facility] Emergency Room on day of admission with  reports of several hours of left substernal chest pain, constant in  nature with associated shortness of breath.  The patient described pain  as pressure.  She denied any diaphoresis, palpitations, nausea, or  vomiting.  Also of note, at time of admission, the patient reported bad  headache as if she were being held under water.  CT of the head  obtained while in the emergency room was negative for any acute  findings.  Given the patient's multiple medical problems, the patient  was admitted for further evaluation and treatment.   PAST MEDICAL HISTORY:  1. 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  2. Right occipital intracranial hemorrhage.  3. Ileocecal Crohn disease status post hemicolectomy.  4. Carpal tunnel release, right hand.  5. Status post tonsillectomy.  6. Status post hysterectomy.  7. Hypertension.  8. History of cerebral aneurysm with hemorrhage in 1971.  9. History of sleep apnea.  10.Migraine headaches.  11.GERD.  12.Benign breast lump.   CONSULTATIONS DURING THIS ADMISSION:  1. [facility].  2. Neurology.   COURSE OF HOSPITALIZATION:  1. Chest pain of unclear etiology.  The patient's cardiac enzymes were      cycled and negative x3.  D-dimer negative.  Cardiology was asked to      see the patient in consultation for possible inpatient versus      outpatient Myoview stress test.  Please see complete consultation      dictation for details.  Despite the patient's prolonged reports of      chest pain, cardiac enzymes have remained unremarkable.  It was      recommended the patient be discontinued home on aspirin and 2-D      echo was performed.  Results of echocardiogram are pending at time      of dictation.  As 2-D echocardiogram was within normal limits with      no wall motion abnormalities, the patient felt medically stable for      discharge for outpatient Myoview.  Again, these tests are pending      at time of dictation.  2. Left hemiparesis with involuntary jerking movement.  Due to the      patient's history  of intracranial hemorrhage, Neurology was asked      to see the patient in consultation.  CT of the head done at time of      admission revealed stable old right parietal-occipital stroke with      no acute findings.  The patient's MRI/MRA of the brain obtained per      Neurology was negative for any acute findings.  The patient's      jerking movements thought inconsistent with seizure activity and      likely medication effect with Haldol.  Given the patient's      continued headaches, Neurology recommended symptomatic treatment      versus preventative treatment with nortriptyline/Topamax versus      Inderal.   MEDICATIONS AT TIME OF DISPOSITION:  1. Topamax 25 mg p.o. at bedtime.  Note, this is a new medication.  2. Atacand 4 mg p.o. daily.  3. InnoPran XL 80 mg p.o. at bedtime.  4. Prilosec 20 mg p.o. daily.  5. Aspirin 81 mg p.o. daily.  6. Norvasc 2.5 mg daily.  Note, this is a new medication.  7. Pentasa 500 mg 2 tablets p.o. at  bedtime.   PERTINENT DISCHARGE LABORATORY DATA:  Total cholesterol 166,  triglycerides 120, HDL 43, and LDL 99.   DISPOSITION:  The patient felt medically stable for discharge home at  this time.  The patient is instructed to follow up with her primary care  physician Dr. Benay [name] on May 12, 2008 at 11:30 a.m.  In  addition, the patient is scheduled for an adenosine stress Myoview on  May 19, 2008 at 8:30 a.m.  The patient is scheduled to follow up  with stress test results with Dr. Stanford [name] on June 03, 2008 at 2:15  p.m.      Julia Ranks, NP      Julia [name]. Asa Lente, MD  Electronically Signed    LE/MEDQ  D:  06/08/2008  T:  06/09/2008  Job:  [id number]   cc:   Ricard Dillon, MD  Denice Bors. Julia Breed, MD, [facility]

## 2011-01-24 NOTE — H&P (Signed)
NAMECLEON, Julia [name]             ACCOUNT NO.:  [id number]   MEDICAL RECORD NO.:  [id number]          PATIENT TYPE:  INP   LOCATION:  8338                         FACILITY:  [facility]   PHYSICIAN:  Irine Seal, MD    DATE OF BIRTH:  Jul 29, 1951   DATE OF ADMISSION:  05/07/2008  DATE OF DISCHARGE:                              HISTORY & PHYSICAL   PRIMARY CARE PHYSICIAN:  Ricard Dillon, MD.   NEUROLOGIST:  Dr. __________, [facility].   HISTORY OF PRESENT ILLNESS:  Julia [name] is a 60 year old white  female with history of right parietal intracranial hemorrhage secondary  to hypertension on June 20, 2006 with a history of Crohn disease and  also history of hysterectomy and migraines who presented to the ED with  a several hour history of left substernal chest discomfort/pain.  The  patient today felt like a pressure sensation, felt like an elephant was  sitting on her chest and associated with some shortness of breath and a  headache.  The patient denies any diaphoresis, no palpitations, no  nausea, no vomiting, no paroxysmal nocturnal dyspnea, no abdominal pain,  no fever, no chills, or no focal neurological symptoms.  The patient  also stated that the headache felt as if she was being held under water,  and her systolic blood pressure during this episode was elevated in the  190s, which was checked at home.  The patient denied any change in her  intermittent left-sided numbness.  The patient then presented to the ED,  CT of the head obtained was negative.  CBC, coags, BMET, and point of  care cardiac markers were all within normal limits.  EKG with a normal  sinus rhythm.  We will call to  admit the patient for further evaluation  and recommendation.  The patient's blood pressure improved in the  emergency department with some improvement in her headache and chest  pain.   ALLERGIES:  1. PENICILLIN causes Hives.  2. DEMEROL causes shortness of breath and  hyperventilation.   PAST MEDICAL HISTORY:  1. 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  2. History of right occipital intracranial hemorrhage.  3. History of ileocecal Crohn disease, status post right      hemicolectomy.  4. History of carpal tunnel surgery on the right hand.  5. Status post tonsillectomy.  6. Status post hysterectomy.  7. Hypertension.  8. History of aneurysm with hemorrhage, 30 years ago in 1971.  9. History of sleep apnea.  10.History of migraine headaches.  11.Gastroesophageal reflux disease.  12.History of benign breast lump.   HOME MEDICATIONS:  1. Prilosec 20 mg daily.  2. Pentasa 500 mg 2 tabs nightly.  3. InnoPran XL 80 mg nightly.  4. Atacand  4 mg p.o. nightly.  5. Aspirin 81 mg p.o. nightly.  6. Halcion 25 mg of quarter tab as needed for insomnia.  7. Fioricet 325 mg as needed for headache.  8. Aleve as needed.   SOCIAL HISTORY:  The patient lives in [place] with her husband.  Remote tobacco use, quit  about 33 years ago.  No current tobacco use.  No alcohol use since her last CVA.  No IV drug use.   FAMILY HISTORY:  Noncontributory.   REVIEW OF SYSTEMS:  As per HPI.   PHYSICAL EXAMINATION:  VITALS:  Temperature 97.0, blood pressure  163/108, pulse of 73, respiratory rate 18, and sating 99%.  GENERAL:  The patient is in no apparent distress.  Sleeping on a gurney.  HEENT:  Normocephalic, atraumatic.  Pupils are equal round and reactive  to light.  Extraocular movements intact.  Oropharynx is clear.  No  lesions.  NECK:  Supple with no lymphadenopathy.  RESPIRATORY:  Lungs are clear to auscultation bilaterally.  No wheezes.  No rhonchi.  No crackles.  CARDIOVASCULAR:  Regular rate rhythm.  No murmurs, rubs, or gallops.  ABDOMEN:  Soft, nontender, and nondistended.  Positive bowel sounds.  No  rebound or guarding.  EXTREMITIES:  No clubbing, cyanosis, or edema.  NEUROLOGIC:  The patient is  alert and oriented x3.  Cranial nerves II  through XII are grossly intact.  No focal deficits.   LABORATORY DATA:  CBC; white count 5.0, hemoglobin 13.5, platelets of  179, and hematocrit of 39.5.  ANC of 2.3, PTT 27, and PT 12.5.  INR 0.9.  Point-of-care cardiac markers; CK-MB 1.42  , troponin I less than 0.05,  myoglobin 47.6 to 44.4.  Sodium 138, potassium 3.9, chloride 105, bicarb  27, BUN 17, creatinine 0.82, glucose of 110, and calcium of 8.9.  Chest  x-ray, no acute abnormalities.  CT of the head stable, old right  parietal-occipital infarct.   ASSESSMENT AND PLAN:  [name] is a 60 year old female with  history of parietal-occipital infarct, history of right parietal  intracranial hemorrhage, history of Crohn disease, history of  hypertension and migraine headaches, presents to the ED with some  substernal chest pain.  1. Chest pain.  Questionable etiology of acute coronary syndrome      versus GI versus musculoskeletal was unlikely versus pneumonia      versus which is unlikely with a negative chest x-ray with cycled      cardiac enzymes q.8 h. X 3.  Check her lipase.  Check her BNP.      Check her amylase.  Check TSH.  We will placed on oxygen, aspirin,      and morphine sulphate and beta-blocker and ARB and follow.  2. Headache, likely secondary to uncontrolled hypertension.      Clinically improvement with decrease in her blood pressure.  3. Hypertensive urgency.  We will restart the patient's home regimen      of her beta-blockers as well as her Atacand and monitor.  4. Migraines, continue on home regimen.  5. Crohn disease, continue Pentasa.  6. Hypertension, continue Inderal and Atacand.  7. Sleep apnea, stable.  8. Prophylaxis.  Protonix for GI prophylaxis, sequential compression      device for DVT prophylaxis.   It has been a pleasure taking care of Julia [name].      Irine Seal, MD  Electronically Signed     DT/MEDQ  D:  05/08/2008  T:   05/08/2008  Job:  [id number]   cc:   Ricard Dillon, MD

## 2011-01-27 NOTE — H&P (Signed)
Julia [name], Julia [name]             ACCOUNT NO.:  [id number]   MEDICAL RECORD NO.:  [id number]          PATIENT TYPE:  INP   LOCATION:  2101                         FACILITY:  [place] [facility]   PHYSICIAN:  Ophelia Charter, M.D.DATE OF BIRTH:  05-28-1951   DATE OF ADMISSION:  06/19/2006  DATE OF DISCHARGE:                                HISTORY & PHYSICAL   CHIEF COMPLAINT:  Headache.   HISTORY OF PRESENT ILLNESS:  The patient is a 60 year old white female who  has a long history of migraine headaches.  The patient had a severe atypical  migraine headache about 3 days ago.  Her husband called EMS and the patient  was taken to [facility], but where according to the report,  she was evaluated by the emergency room staff, including a cranial CT scan,  which they were told was okay.  The patient was treated and released.  Unfortunately, the headache did not improve.  She had persistent headache,  then some nausea and vomiting.  EMS was called again and the patient, at  this time, was brought to [facility], where she was evaluated by  Dr. Vanessa Kick.  She complained of a headache and also some tightness in her  chest.  He did an EKG and told them her heart was fine.  She was further  worked up with a CT scan which demonstrated a right intracerebral hemorrhage  and a neurosurgical consultation was requested.  The patient was transferred  to [facility] for further neurosurgical evaluation and management.   Presently, the patient complains of a headache.  She denies chest pain,  shortness of breath, abdominal pain, back pain, etc.  There  was no  antecedent trauma.  The patient does have a questionable history of an  intracerebral hemorrhage.  According to the patient and her husband, when  she was approximately 1 years old, she was in the hospital and treated with  IV therapy.  According to them, there was too much IV fluids given and she  developed a severe  headache.  The patient underwent a lumbar puncture and  was told she had a aneurysm.  She had an arteriogram done via carotid  stick at that time, but evidently there was no further treatment needed.  This was well over 30 years ago.   The patient has had a chronic history of migraine headaches and has seen Dr.  Earley Favor and he was not able to help her much and more recently Dr. Benay Pillow has been treating her medically.  No history of seizures, weakness,  speech trouble, etc.   PAST MEDICAL HISTORY:  Positive for:  1. Crohn's disease.  2. Hypertension.  3. She has had some shortness of breath and Dr. Benay Pillow has scheduled      her for a stress test, but this has not been done yet.  4. Migraine headaches.  5. Carpal tunnel syndrome.  6. Benign breast lump.  7. Questionable aneurysm as above.   PAST SURGICAL HISTORY:  1. Benign breast biopsy.  2. Hysterectomy.  3. Bowel resection.  4.  Carpal tunnel release.   DRUG ALLERGIES:  PENICILLIN CAUSES A RASH.   MEDICATIONS PRIOR TO ADMISSION:  1. Pentasa 1 p.o. t.i.d.  2. OxyContin p.r.n. for migraines.  3. Reglan p.r.n..  4. Hydrocodone p.r.n.  5. Prilosec p.o. daily.   FAMILY MEDICAL HISTORY:  The patient's mother is age 44 and in good health  for her age.  The patient's father, at age 24, had stomach cancer.  He was  also an alcoholic.   SOCIAL HISTORY:  The patient is married.  She has 2 daughters.  She lives in  [place].  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  Patient rarely drinks alcohol.  Quit smoking approximately 20 years ago.  Denies drug use.   REVIEW OF SYSTEMS:  Negative, except as above.   PHYSICAL EXAMINATION:  GENERAL:  A pleasant 60 year old white female who  complains of a headache.  HEENT:  Normocephalic, atraumatic.  Her pupils are equal, round and reactive  to light.  Extraocular muscles intact.  Oropharynx is benign.  Face is   symmetric.  NECK:  Supple.  No masses or deformities.  Good range of motion.  No jugular  venenous distention or tracheal deviation.  Thorax is symmetric.  LUNGS:  Clear to auscultation.  HEART:  Regular rate and rhythm.  ABDOMEN:  Soft and nontender.  EXTREMITIES:  Full range of motion.  NEUROLOGIC EXAM:  The patient is alert and oriented x3.  Glasgow coma scale  15.  Cranial nerves II-XII were examined bilaterally and grossly normal,  except that she has a left homonomous hemianopsia.  Motor strength is 5/5 at  about biceps, triceps, hand grip, deltoid, so as quadriceps and extensors  hallicis longus.  Her deep tendon reflexes are 1/4 in about biceps, triceps,  quadriceps and gastrocnemius.  There is nonsustained ankle close  bilaterally.  Sensory exam is intact to light touch in all tested dermatomes  bilaterally.  Cerebellar function is intact and rapid alternating movements  of the upper extremities bilaterally.   IMAGING STUDIES:  I reviewed the patient's cranial CT performed without  contrast today at [facility].  It demonstrates she has got a right  occipital intracerebral hemorrhage with some surrounding subarachnoid  hemorrhage and some mild surrounding edema.  There is some mild to moderate  mass effect with approximately 6 mm of midline shift.  I do not see any  evidence of blood in the basal cisterns.   ASSESSMENT/PLAN:  Right occipital intracerebral hemorrhage.  I have  discussed the situation with the patient, her husband and 2 daughters (at  the patient's request).  I told them that she has had a hemorrhagic stroke  and that there is multiple potential causes for this.  I have recommended  that we admit her and watch her carefully in the ICU and repeat her CAT scan  in the morning and initiate a stroke workup to include carotid Dopplers, echocardiogram, lab work, etc. and the stroke team to see her as well.  Because of this stroke and because of that there is  a  questionable history of aneurysm, we are going to need to get a cerebral  arteriogram in the next few days.  I have answered all questions.   History of Crohn's disease, migraine headaches, etc. noted.      Ophelia Charter, M.D.  Electronically Signed     JDJ/MEDQ  D:  06/19/2006  T:  06/20/2006  Job:  [id number]   cc:   Ricard Dillon, MD

## 2011-01-27 NOTE — Assessment & Plan Note (Signed)
[facility]                                 ON-CALL NOTE   ITZY, ADLER                  MRN:          [id number]  DATE:08/12/2006                            DOB:          [birth year]    CALLER:  Blaise Palladino, her husband.   PRIMARY CARE PHYSICIAN:  Ricard Dillon, MD.   PHONE NUMBER:  [phone].   SUBJECTIVE:  Ms. Whitacre is status post CVA in the past. She has  elevated blood pressure and is usually on lisinopril 5 mg p.o. b.i.d.  and metoprolol 25 mg p.o. b.i.d. This morning her blood pressure was  systolic 657, at around 3 it was 111/70 but then this evening at around  7-8 it is 90/60. Her husband is wondering whether she should take her  medications this evening.   ASSESSMENT/PLAN:  She will hold off on the hydrocodone she usually takes  at night since this may drop her blood pressure further. She will also  hold the lisinopril and metoprolol. They will recheck her blood pressure  several hours from now. If it is going up above 140/90, she should take  her second dose of metoprolol 25 mg. Very likely she may need to come  down off of some of her blood pressure medications and if they notice  blood pressures dropping low regularly they will contact her primary  care doctor, Dr. Arnoldo Morale.     Eliezer Lofts, MD  Electronically Signed    AB/MedQ  DD: 08/12/2006  DT: 08/13/2006  Job #: [id number]

## 2011-01-27 NOTE — Assessment & Plan Note (Signed)
[facility]                                 ON-CALL NOTE   JACAYLA, NORDELL                      MRN:          [id number]  DATE:08/14/2006                            DOB:          12-Nov-1950    A patient of Dr. Arnoldo Morale.   She called at 9:30 p.m. on August 14, 2006.  The patient had some  questions about her medication.  The patient was given prescription for  hydrocodone and has Topamax from the neurologist.  The patient was  wondering if she would be able to take them together.  She said that the  Topamax has been helping with her migraines and this is the first one  she has had in awhile.  She says she was not sure if she is able to take  the hydrocodone if she needed it.  I told her that would be okay to take  the hydrocodone if she needed it for her typical migraine.  If she was  unable to get good relief with that, she should followup with Dr.  Arnoldo Morale or the neurologist.     Rosalita Chessman, DO  Electronically Signed    Telford Nab  DD: 08/14/2006  DT: 08/15/2006  Job #: [id number]   cc:   Ricard Dillon, MD

## 2011-01-27 NOTE — Discharge Summary (Signed)
NAMEKERRYN, [name]             ACCOUNT NO.:  [id number]   MEDICAL RECORD NO.:  [id number]          PATIENT TYPE:  INP   LOCATION:  3006                         FACILITY:  [facility]   PHYSICIAN:  Pramod P. Leonie Man, MD    DATE OF BIRTH:  Nov 01, 1950   DATE OF ADMISSION:  06/19/2006  DATE OF DISCHARGE:  06/26/2006                                 DISCHARGE SUMMARY   DIAGNOSIS AT TIME OF DISCHARGE:  1. 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  2. Hypertension.  3. Hypokalemia.  4. Headache.  5. Crohn's disease.  6. She has had some shortness of breath and Dr. Arnoldo Morale has scheduled her      for a stress test but this has not yet been done.  7. Migraine headaches.  8. Carpal tunnel syndrome.  9. Benign breast lump.  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  She underwent a lumbar puncture and was told she had an      aneurysm.  There was no further treatment needed.  This was well over      30 years ago.   PAST MEDICAL HISTORY:  Benign breast biopsy, hysterectomy, bowel resection  carpal tunnel release.   MEDICINES AT TIME OF DISCHARGE:  Ultram 50 mg 1-2 tablets q.6h. p.r.n. pain,  Lisinopril 10 mg a day, Prilosec 20 mg a day, Pentasa b.i.d., Apresoline 25  mg a day, Topamax 50 mg b.i.d., K-Dur 40 mEq a day, multivitamin a day, and  vitamin B6 a day.   STUDIES PERFORMED:  1. CT of the brain on admission shows a large right parietal occipital      hemorrhage, there is subdural and subarachnoid extension.  There is      also associated 6 mm shift of midline structures from right to left.  2. CT of the head at 24 hours showed slight interval retractions of the      right parietal lobe with stable extension of fluid into the adjacent to      subarachnoid and cistern spaces.  3. MRI of the brain shows a large posterior temporal parietal occipital      hemorrhage with surrounding edema and mass effect, a 2.5 mm right-to-      left midline shift, suggestion of partial resolution of the subdural      component in the posterior hemispheric fissure.  No change in      subarachnoid component.  No abnormal blood vessels seen in the      immediate vicinity of this hemorrhage.  4. MRA of the brain showed no evidence of occlusion or aneurysms or      abnormality, prominent vessels noted on the images, suggestion of      approximately 50% stenosis of the right middle cerebral artery just      distal to the anterior cerebral branch.  5. Follow-up CT on the 12th  showed no substantial interval change in the      right occipital hematoma with subdural and intraventricular blood,      stable right-to-left midline shift.  6. Final CT performed on the 15th showing stable appearance of hemorrhage.  7. EKG showed a marked sinus bradycardia with possible left atrial      enlargement.  8. 2-D echocardiogram showed EF of 60% with no left ventricular regional      wall motion abnormalities, no obvious embolic source.  9. Carotid Doppler shows no significant plaque bilaterally, vertebral      artery flow antegrade bilaterally.   LABORATORY STUDIES:  CBC with white blood cells 11.2, otherwise normal.  PTT  23, otherwise normal. Chemistry with potassium 3.4, glucose 120, otherwise  normal.  Albumin 3, AST 21, ALT 18, ALT 71, and total bilirubin 1.3.  Cardiac enzymes were normal.  Homocystine is 6.6.  Cholesterol was 153,  triglycerides 133, HDL 51, and LDL 85.   HISTORY OF PRESENT ILLNESS:  [name] is a 60 year old Caucasian  female who has a long history of migraine headaches.  The patient had a  severe atypical migraine headache about three days prior to admission.  Her  husband called EMS and the patient was taken to [facility]  where, according to  the report, she was evaluated by the emergency room  staff including a cranial CT which they were told was okay.  The patient was  treated and released.  Unfortunately, the headache did not improve.  She had  persistent headache, with some nausea and vomiting.  EMS was again called  and the patient, at this time, was brought to [facility] Emergency Room  where she was evaluated by Dr. Vanessa Kick.  She complained of a headache and  also some tightness in her chest.  He did an EKG and told them her heart was  fine.  She was further worked up with a CT of the head which demonstrated a  right intracerebral hemorrhage and a neurosurgical consultation was  requested.  The patient was transferred to [facility] for further  neurologic evaluation and management.   HOSPITAL COURSE:  Serial CTs were performed which showed ongoing stability  of the hemorrhage without significant increase.  Neurosurgery eventually  signed off as there were no surgical interventions required and stroke  service took over the patient's care.  The patient had a significant  headache secondary to intracerebral hemorrhage and was started on Topamax  for headache control.  Her headache continued and Ultram with added p.r.n.  Her blood pressures were above normal in the 160s to 190s range and was  started on blood pressure medicines.  She was apparently taking blood  pressure medicine at home but not on a regular basis.  She was put on  lisinopril and Apresoline for blood pressure management and has come down to  a more normal range being 141/98 at the time of discharge.  The patient was  evaluated by PT and OT who felt that the patient would benefit from home  therapies and that she was safe to return home.  No other significant  vascular risk factors were identified during hospitalization.   CONDITION ON DISCHARGE:  The patient is alert and oriented x3.  Speech clear.  No aphasia.  She does have a dense left  homonymous hemianopsia.  She  has no facial weakness and no drift.   DISCHARGE PLAN:  1. Discharge home with husband.  2. Outpatient PT and OT.  3. No antiplatelets at this time.  Will consider resumption at follow-up      with Dr. Leonie Man.  4. Follow up with Dr. Leonie Man in two months.  5. Follow up with primary care physician in two weeks, especially for      blood pressure management, and follow-up of hypokalemia.     Burnetta Sabin, N.P.    ______________________________  Kathie Rhodes. Leonie Man, MD   SB/MEDQ  D:  06/26/2006  T:  06/27/2006  Job:  [id number]   cc:   Ricard Dillon, MD

## 2011-01-27 NOTE — Assessment & Plan Note (Signed)
[facility] OFFICE NOTE   NAME:THORNBURGNeena, Beecham                  MRN:          [id number]  DATE:12/13/2006                            DOB:          Jun 12, 1951    Julia [name] is much better symptomatically after taking metronidazole.  Her C.  difficile toxin was negative.  All of her blood work was normal.  I have  gone ahead and replaced her potassium with Lialdia 2.4 gm a day, and I  have given her some Anusol HC suppositories to use on a p.r.n. basis.  I  will continue to follow her as needed.     Loralee Pacas. Sharlett Iles, MD, Quentin Ore, [place]  Electronically Signed    DRP/MedQ  DD: 12/13/2006  DT: 12/13/2006  Job #: [id number]   cc:   Ricard Dillon, MD  Pramod P. Leonie Man, MD

## 2011-01-27 NOTE — Assessment & Plan Note (Signed)
[facility] OFFICE NOTE   Julia [name], Julia [name]                  MRN:          [id number]  DATE:12/04/2006                            DOB:          Aug 08, 1951    Mrs. 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 She has been in remission on low dose aminosalicylate therapy  in the form of Pentasa 500 mg t.i.d. I last saw her approximately a year  ago. She now relates that she has had abdominal cramping, diarrhea, but  no rectal bleeding over the last week. She did Z-Pak a couple of months  ago. She denies any other antibiotic or infectious disease exposure. She  has had no fever, chills, skin rashes, joint pains, or other systemic  complaints. Her appetite is fairly good and her weight has been stable.   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 Arnoldo Morale and Dr. Leonie Man. She apparently had  recent MRI of the head which was unchanged. She is taking;  1. Topamax 100 mg a day.  2. Coreg 12.5 mg a half a tablet a day.  3. Protonix 40 mg a day.  4. A variety of multivitamins.   She does have a long history of migraine headaches and uses p.r.n.  Cafergot, Lunesta, and oxycodone. IN THE PAST SHE HAS HAD REACTIONS WITH  PENICILLIN USE.   Exam today shows a weight of 137 pounds, which is down 10 pounds from  her normal weight. Blood pressure 130/84, and pulse was 60 and regular.  She was a nontoxic, healthy-appearing white female in no acute distress.  I could not appreciate stigmata of chronic liver disease.  ABDOMEN: Showed no hepatosplenomegaly, abdominal masses, or tenderness,  but there was slight distension.  INSPECTION OF THE RECTUM: Unremarkable, as was rectal exam. There almost  no stool present but what was there, was guaiac negative.  She did not have Peripheral edema, swollen joints, or any rashes.  Mental status was clear.   ASSESSMENT:  1. 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  2. Probable C. difficile colitis.  3. Chronic acid reflux on daily proton pump inhibitor therapy.  4. Hypertensive Cardiovascular disease with recent CVA. The patient is      not anticoagulated.  5. History of migraine headaches.   RECOMMENDATIONS:  1. Check lab data including CBC, sed rate, CRP, and stool exams for      cultures and C. difficile toxin.  2. Start low fiber diet.  3. Start metronidazole 250 mg q.i.d. for 10 days along with Align      probiotic therapy.  4. Local anal care and cream.  5. We will see her back in 1 weeks time and consider  colonoscopy,      depending on her clinical course.  6. Continue other medications as mentioned above for hypertension.     Loralee Pacas. Sharlett Iles, MD, Quentin Ore, [place]  Electronically Signed    DRP/MedQ  DD: 12/04/2006  DT: 12/04/2006  Job #: [id number]   cc:   Pramod P. Leonie Man, MD  Ricard Dillon, MD

## 2011-01-27 NOTE — Consult Note (Signed)
NAMEALSACE, DOWD             ACCOUNT NO.:  [id number]   MEDICAL RECORD NO.:  [id number]          PATIENT TYPE:  INP   LOCATION:  2101                         FACILITY:  [facility]   PHYSICIAN:  Jill Alexanders, M.D.  DATE OF BIRTH:  [birth year]   DATE OF CONSULTATION:  06/20/2006  DATE OF DISCHARGE:                                   CONSULTATION   HISTORY OF PRESENT ILLNESS:  Julia [name] is a 60 year old right-  handed white female born 1951/04/22 with a history of hypertension.  The patient gives a history of an aneurysmal hemorrhage 30 years ago that  occurred in the back of her head.  04/29/1951 with a history of  hypertension.  The patient gives a history of aneurysmal hemorrhage 30 years  ago that occurred the back of her head and was told that the aneurysm had  obliterated itself and did not require surgery.  The patient has had a  history of migraine headaches but came in with a more severe headache in  usual that began several days prior to this admission.  This patient had  apparently been seen through [facility] and claims to have  undergone a CT scan of the brain that was unremarkable.  The patient  returned to [facility] with ongoing headache and a CT scan of the  head shows evidence of a right parietal and primarily occipital intracranial  hemorrhage.  This was felt to be either a primary hemorrhage or a  hemorrhagic infarct.  The patient had noted visual field disturbance off to  the left prior to admission.  The patient does have some hiccups off and on  as well.  The patient 'denies any numbness, weakness of arms or legs.  The  patient feels weak all over.  The patient has undergone a bit further  workup.  Echocardiogram has been done.  The results are pending.  Carotid  Doppler study has been done which is unremarkable.  Patient is being seen at  this point by neurology for further evaluation.  The patient is admitted by  Dr. Earle Gell.   PAST MEDICAL HISTORY:  1. History of right occipital intracranial hemorrhage as above.  2. History of Crohn disease a small bowel resections in the past.  3. History of carpal tunnel syndrome surgery on both sides.  4. Tonsillectomy.  5. Hysterectomy.  6. Hypertension.  7. History of aneurysm with a hemorrhage 30 years ago.  8. History of sleep apnea.  9. History of migraine.   MEDICATIONS:  The patient is currently on:  1. Colace 100 mg b.i.d.  2. Protonix 40 mg daily.  3. Tylenol as needed.  4. Labetalol as needed.  5. Morphine as needed.  6. Oxycodone as needed.  7. Zofran if needed.   SOCIAL HISTORY:  The patient does not smoke.  Drinks alcohol on occasion.   SOCIAL HISTORY:  This patient lives in the [place], [place] area.  He Is married.  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  He has two children who are alive  and well.   FAMILY MEDICAL HISTORY:  Notable that mother is still living and in good  health.  Father died with cancer.  The patient has one brother and two  sisters.  One sister has some form of clotting disorder and is on Coumadin  for this.  The patient is not aware of any other family members with  clotting disorders.   REVIEW OF SYSTEMS:  Notable for the severe headache, some neck pain  associated with the headache, visual disturbance associated with the  headache.  The patient has hiccups.  Does note some shortness of breath.  Has had chest pains on and off for 6 weeks.  She is to be set up for a  stress test in the near future.  The patient has no nausea, vomiting.  Denies any problems controlling the bowels or bladder.  Feels weak all over.  The patient denies blackouts or seizures.   PHYSICAL EXAMINATION:  VITAL SIGNS:  Blood pressure is 180/82, heart rate is  114, respiratory rate 14, afebrile.  GENERAL:  The patient is a fairly well-developed white female who is sleepy  but can be aroused at the time of  examination and will converse.  HEENT:  Examination head is atraumatic.  Eyes:  Pupils are equal, round, 3-4  mm react to light.  Disks are flat bilaterally.  Neck is relatively supple.  No carotid bruits noted.  RESPIRATORY:  Examination is clear.  CARDIOVASCULAR:  Patient with a regular rate and rhythm.  No obvious murmurs  or rubs noted.  EXTREMITIES:  Without significant edema.  NEUROLOGIC EXAMINATION:  Cranial nerves as above.  Minimal facial droop  noted on the left side with flattening left nasolabial fold.  Extraocular  movements are [place] relatively full.  The patient has a dense left homonymous  visual field deficit.  Speech is well enunciated and not aphasic.  Again  pinprick sensation on face is symmetric throughout.  Motor testing reveals  good strength.  Good symmetric motor is noted throughout.  Sensory testing  reveals some minimal decrease in pinprick sensation on the left arm as  compared to the right.  Vibratory sensation is intact in all fours.  Pinprick sensation on the legs is symmetric and normal.  The patient has  fair finger-nose-finger on the right side and has some visual-spatial  apraxia on the left arm and the left leg with the toe-to-finger and has good  toe-to-finger on the right side.  Deep tendon reflexes are relatively  symmetric.  Toes are neutral bilaterally.  No drift is seen.   LABORATORY DATA:  Laboratory values are notable for cardiac enzymes with CK  of 27, troponin-I of 0.02, white count is 11.7, hematocrit 14.3, hemoglobin  of 41.2, MCV of 87.8, platelets are 235,000.  Sodium 141, potassium 3.7,  chloride of 104, CO2 is 28, glucose of 116, BUN 12, creatinine 0.8, calcium  of 8.9, lipase of 45.  Coags not available.   CT of the head is as above.  The patient does not have either primary  hemorrhage or hemorrhagic infarct.  There does appear to be some  subarachnoid extension; 6 mm of a midline shift is noted.   IMPRESSION: 1. New onset of right  occipital and parietal intracranial hemorrhage.  2. History of hypertension.  3. This patient has a history of aneurysm in the past 30 years ago.      Records of this probably do not exist.  The patient at this point has  sustained moderate size hemorrhage, possibly related to a hemorrhagic      infarct or primary hemorrhage.  I would lean towards the former as the      patient apparently had onset of a headache several days prior to      admission and reportedly had a negative CT of the head at [facility].  The patient may have developed an ischemic infarct earlier      on with the headache and then subsequent had secondary hemorrhagic      conversion. At any rate, further stroke evaluation is indicated.  I am      not clear how all the history of prior aneurysm plays into the current      events but this does need be taken into account.   PLAN:  1. MRI of the brain.  2. MRI angiogram of internal and external vessels.  3. Analgesic for pain.  4. Will check the blood work for PT/PTT, homocystine level, cholesterol      panel.  5. A 2-D echocardiogram is pending at this time.  6. We will follow the patient's clinical course while in-house.      Jill Alexanders, M.D.  Electronically Signed     CKW/MEDQ  D:  06/20/2006  T:  06/21/2006  Job:  [id number]   cc:   Ricard Dillon, MD  Ophelia Charter, M.D.  [facility]

## 2011-02-15 ENCOUNTER — Telehealth: Payer: Self-pay | Admitting: Internal Medicine

## 2011-02-15 MED ORDER — PSEUDOEPH-BROMPHEN-DM 30-2-10 MG/5ML PO SYRP: 10.0000 mL | ORAL_SOLUTION | Freq: Four times a day (QID) | ORAL | Status: AC | PRN

## 2011-02-15 NOTE — Telephone Encounter (Signed)
May have per dr Arnoldo Morale

## 2011-02-15 NOTE — Telephone Encounter (Signed)
Ok for bromfed dm

## 2011-02-15 NOTE — Telephone Encounter (Signed)
Triage vm---------she has another allergy cough (yearly), and in the past, MD had  rx'd Decor Dm liquid. She would like another rx called to [phone] in [place], [place].

## 2011-02-23 ENCOUNTER — Telehealth: Payer: Self-pay | Admitting: *Deleted

## 2011-02-23 NOTE — Telephone Encounter (Signed)
Pt states Decor is not made anymore and the Bromfed is too expensive.  Any other suggestions?

## 2011-02-24 NOTE — Telephone Encounter (Signed)
Try delsym otc- nothinglike the other med- pt informed

## 2011-02-24 NOTE — Telephone Encounter (Signed)
Per dr Julia [name]- try otc delsym-pt informed

## 2011-03-01 ENCOUNTER — Telehealth: Payer: Self-pay | Admitting: Internal Medicine

## 2011-03-20 ENCOUNTER — Other Ambulatory Visit: Payer: Self-pay | Admitting: *Deleted

## 2011-03-20 NOTE — Telephone Encounter (Signed)
Called pharamcy and they will chamnge back to propranolol 80- order given

## 2011-03-20 NOTE — Telephone Encounter (Signed)
Pt wants to go back to Propanolol. Please call the pharmacy listed in [place].

## 2011-03-21 ENCOUNTER — Other Ambulatory Visit (INDEPENDENT_AMBULATORY_CARE_PROVIDER_SITE_OTHER): Payer: Medicare Other

## 2011-03-21 DIAGNOSIS — Z79899 Other long term (current) drug therapy: Secondary | ICD-10-CM

## 2011-03-21 DIAGNOSIS — E785 Hyperlipidemia, unspecified: Secondary | ICD-10-CM

## 2011-03-21 DIAGNOSIS — Z0000 Encounter for general adult medical examination without abnormal findings: Secondary | ICD-10-CM

## 2011-03-21 DIAGNOSIS — I10 Essential (primary) hypertension: Secondary | ICD-10-CM

## 2011-03-21 DIAGNOSIS — R4789 Other speech disturbances: Secondary | ICD-10-CM

## 2011-03-21 LAB — BASIC METABOLIC PANEL
BUN: 14 mg/dL (ref 6–23)
Chloride: 106 mEq/L (ref 96–112)
GFR: 80.76 mL/min (ref >60.00)
Glucose, Bld: 95 mg/dL (ref 70–99)
Potassium: 5.1 mEq/L (ref 3.5–5.1)
Sodium: 142 mEq/L (ref 135–145)

## 2011-03-21 LAB — CBC WITH DIFFERENTIAL/PLATELET
Basophils Absolute: 0 10*3/uL (ref 0.0–0.1)
Eosinophils Relative: 1.6 % (ref 0.0–5.0)
HCT: 42.8 % (ref 36.0–46.0)
Hemoglobin: 14.3 g/dL (ref 12.0–15.0)
Lymphs Abs: 1.8 10*3/uL (ref 0.7–4.0)
MCV: 94.4 fl (ref 78.0–100.0)
Monocytes Absolute: 0.5 10*3/uL (ref 0.1–1.0)
Monocytes Relative: 10.6 % (ref 3.0–12.0)
Neutro Abs: 2.5 10*3/uL (ref 1.4–7.7)
Platelets: 189 10*3/uL (ref 150.0–400.0)
RDW: 13.1 % (ref 11.5–14.6)

## 2011-03-21 LAB — POCT URINALYSIS DIPSTICK
Bilirubin, UA: NEGATIVE
Blood, UA: NEGATIVE
Glucose, UA: NEGATIVE
Ketones, UA: NEGATIVE
Spec Grav, UA: 1015

## 2011-03-21 LAB — LIPID PANEL
Cholesterol: 190 mg/dL (ref 0–200)
Triglycerides: 156 mg/dL — ABNORMAL HIGH (ref 0.0–149.0)

## 2011-03-21 LAB — HEPATIC FUNCTION PANEL
ALT: 19 U/L (ref 0–35)
AST: 23 U/L (ref 0–37)
Albumin: 4.5 g/dL (ref 3.5–5.2)

## 2011-03-31 ENCOUNTER — Encounter: Payer: Self-pay | Admitting: Internal Medicine

## 2011-03-31 ENCOUNTER — Ambulatory Visit: Payer: Self-pay | Admitting: Internal Medicine

## 2011-03-31 ENCOUNTER — Ambulatory Visit (INDEPENDENT_AMBULATORY_CARE_PROVIDER_SITE_OTHER): Payer: Medicare Other | Admitting: Internal Medicine

## 2011-03-31 DIAGNOSIS — R4789 Other speech disturbances: Secondary | ICD-10-CM

## 2011-03-31 DIAGNOSIS — R4702 Dysphasia: Secondary | ICD-10-CM

## 2011-03-31 DIAGNOSIS — Z0000 Encounter for general adult medical examination without abnormal findings: Secondary | ICD-10-CM

## 2011-03-31 MED ORDER — BUTALBITAL-APAP-CAFF-COD 50-325-40-30 MG PO CAPS: 1.0000 | ORAL_CAPSULE | ORAL | Status: DC | PRN

## 2011-03-31 MED ORDER — DULOXETINE HCL 30 MG PO CPEP: 30.0000 mg | ORAL_CAPSULE | Freq: Every day | ORAL | Status: DC

## 2011-03-31 MED ORDER — FEXOFENADINE HCL 180 MG PO TABS: 180.0000 mg | ORAL_TABLET | Freq: Every day | ORAL | Status: AC

## 2011-03-31 MED ORDER — ROSUVASTATIN CALCIUM 20 MG PO TABS: 20.0000 mg | ORAL_TABLET | ORAL | Status: DC

## 2011-03-31 MED ORDER — AMLODIPINE BESYLATE 2.5 MG PO TABS: 2.5000 mg | ORAL_TABLET | Freq: Every day | ORAL | Status: DC

## 2011-03-31 MED ORDER — OMEPRAZOLE 20 MG PO CPDR: 20.0000 mg | DELAYED_RELEASE_CAPSULE | Freq: Every day | ORAL | Status: DC

## 2011-03-31 MED ORDER — CLONIDINE HCL ER 0.1 MG PO TB12: ORAL_TABLET | ORAL | Status: DC

## 2011-03-31 MED ORDER — TRIAZOLAM 0.25 MG PO TABS: 0.2500 mg | ORAL_TABLET | Freq: Every evening | ORAL | Status: DC | PRN

## 2011-03-31 NOTE — Progress Notes (Signed)
Subjective:    Patient ID: Julia [name], female    DOB: Jan 13, 1951, 60 y.o.   MRN: [id number]  HPI Patient presents for complete physical examination and wellness visit.  Blood pressure stable she feels generally well her comorbid problems include sarcoidosis hyperlipidemia hypertension gastroesophageal reflux and osteoarthritis.  She has acute worsening of her gastroesophageal reflux with feeling that food goes up into her throat while eating and has some difficulty swallowing food she does not have any pill dysphasia at this time nor any liquid dysphagia  Review of Systems  Constitutional: Negative for activity change, appetite change and fatigue.  HENT: Negative for ear pain, congestion, neck pain, postnasal drip and sinus pressure.   Eyes: Negative for redness and visual disturbance.  Respiratory: Negative for cough, shortness of breath and wheezing.   Gastrointestinal: Negative for abdominal pain and abdominal distention.  Genitourinary: Negative for dysuria, frequency and menstrual problem.  Musculoskeletal: Negative for myalgias, joint swelling and arthralgias.  Skin: Negative for rash and wound.  Neurological: Negative for dizziness, weakness and headaches.  Hematological: Negative for adenopathy. Does not bruise/bleed easily.  Psychiatric/Behavioral: Negative for sleep disturbance and decreased concentration.   Past Medical History  Diagnosis Date  . Anal fissure 04/29/2008  . BACK PAIN, LUMBAR, WITH RADICULOPATHY 09/21/2008  . BREAST CYSTS, BILATERAL 10/01/2007  . Carpal tunnel syndrome 04/05/2007  . 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 CHEST DISCOMFORT 10/06/2008  . CHOLELITHIASIS 04/05/2007  . COLITIS 04/05/2007  . COMMON MIGRAINE 04/05/2007  . CONSTIPATION 04/29/2008  . Cough variant asthma 09/15/2009  . CROHN'S DISEASE 04/05/2007  . 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 Diarrhea 04/29/2008  . DIZZINESS, CHRONIC 10/26/2009  . FACIAL PARESTHESIA, LEFT  11/04/2007  . GERD 04/05/2007  . HIATAL HERNIA 04/29/2008  . [place] 06/29/2010  . HYPERTENSION 04/05/2007  . 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 Sarcoidosis 04/05/2007  . SLEEP APNEA 04/05/2007  . UNSPECIFIED VENOUS INSUFFICIENCY 11/04/2007   Past Surgical History  Procedure Date  . Breast lumpectomy   . Colectomy   . Carpel tunnel   . Abdominal hysterectomy   . Tonsillectomy     reports that she quit smoking about 31 years ago. She does not have any smokeless tobacco history on file. She reports that she does not drink alcohol or use illicit drugs. family history includes Cancer in her father. Allergies  Allergen Reactions  . Meperidine Hcl     REACTION: vomiting  . Penicillins     REACTION: rash       Objective:   Physical Exam  Nursing note and vitals reviewed. Constitutional: She is oriented to person, place, and time. She appears well-developed and well-nourished. No distress.  HENT:  Head: Normocephalic and atraumatic.  Right Ear: External ear normal.  Left Ear: External ear normal.  Nose: Nose normal.  Mouth/Throat: Oropharynx is clear and moist.  Eyes: Conjunctivae and EOM are normal. Pupils are equal, round, and reactive to light.  Neck: Normal range of motion. Neck supple. No JVD present. No tracheal deviation present. No thyromegaly present.  Cardiovascular: Normal rate, regular rhythm, normal heart sounds and intact distal pulses.   No murmur heard. Pulmonary/Chest: Effort normal and breath sounds normal. She has no wheezes. She exhibits no tenderness.  Abdominal: Soft. Bowel sounds are normal.  Musculoskeletal: Normal range of motion. She exhibits no edema and no tenderness.  Lymphadenopathy:    She has no cervical adenopathy.  Neurological: She is alert and oriented to person, place, and time. She  has normal reflexes. No cranial nerve deficit.  Skin: Skin is warm and dry. She is not diaphoretic.  Psychiatric: She has a normal mood and  affect. Her behavior is normal.          Assessment & Plan:  Patient's yearly physical examination should be stable  This is a routine physical examination for this healthy  Female. Reviewed all health maintenance protocols including mammography colonoscopy bone density and reviewed appropriate screening labs. Her immunization history was reviewed as well as her current medications and allergies refills of her chronic medications were given and the plan for yearly health maintenance was discussed all orders and referrals were made as appropriate.  I am concerned about her dysphasia with history of reflux she may be developing some stricture disease. We will refer her to gastroenterology for consideration for EGD .

## 2011-04-26 ENCOUNTER — Other Ambulatory Visit: Payer: Self-pay | Admitting: *Deleted

## 2011-05-01 ENCOUNTER — Encounter: Payer: Self-pay | Admitting: Gastroenterology

## 2011-05-17 ENCOUNTER — Encounter: Payer: Self-pay | Admitting: *Deleted

## 2011-05-18 ENCOUNTER — Encounter: Payer: Self-pay | Admitting: Gastroenterology

## 2011-05-18 ENCOUNTER — Telehealth: Payer: Self-pay | Admitting: Gastroenterology

## 2011-05-18 ENCOUNTER — Ambulatory Visit (INDEPENDENT_AMBULATORY_CARE_PROVIDER_SITE_OTHER): Payer: Medicare Other | Admitting: Gastroenterology

## 2011-05-18 ENCOUNTER — Other Ambulatory Visit (INDEPENDENT_AMBULATORY_CARE_PROVIDER_SITE_OTHER): Payer: Medicare Other

## 2011-05-18 DIAGNOSIS — I10 Essential (primary) hypertension: Secondary | ICD-10-CM

## 2011-05-18 DIAGNOSIS — K5080 Crohn's disease of both small and large intestine without complications: Secondary | ICD-10-CM

## 2011-05-18 DIAGNOSIS — K6389 Other specified diseases of intestine: Secondary | ICD-10-CM

## 2011-05-18 DIAGNOSIS — R197 Diarrhea, unspecified: Secondary | ICD-10-CM

## 2011-05-18 DIAGNOSIS — K50819 Crohn's disease of both small and large intestine with unspecified complications: Secondary | ICD-10-CM

## 2011-05-18 DIAGNOSIS — K219 Gastro-esophageal reflux disease without esophagitis: Secondary | ICD-10-CM

## 2011-05-18 HISTORY — DX: Other specified diseases of intestine: K63.89

## 2011-05-18 LAB — CBC WITH DIFFERENTIAL/PLATELET
Basophils Absolute: 0 10*3/uL (ref 0.0–0.1)
Basophils Relative: 0.4 % (ref 0.0–3.0)
HCT: 41.7 % (ref 36.0–46.0)
Hemoglobin: 14 g/dL (ref 12.0–15.0)
Lymphocytes Relative: 31.8 % (ref 12.0–46.0)
Lymphs Abs: 1.6 10*3/uL (ref 0.7–4.0)
Monocytes Relative: 9.2 % (ref 3.0–12.0)
Neutro Abs: 2.9 10*3/uL (ref 1.4–7.7)
RBC: 4.52 Mil/uL (ref 3.87–5.11)
RDW: 13.3 % (ref 11.5–14.6)

## 2011-05-18 LAB — BASIC METABOLIC PANEL
BUN: 16 mg/dL (ref 6–23)
Calcium: 9 mg/dL (ref 8.4–10.5)
GFR: 119.02 mL/min (ref >60.00)
Potassium: 4.1 mEq/L (ref 3.5–5.1)
Sodium: 139 mEq/L (ref 135–145)

## 2011-05-18 LAB — HEPATIC FUNCTION PANEL
Albumin: 3.7 g/dL (ref 3.5–5.2)
Alkaline Phosphatase: 64 U/L (ref 39–117)
Bilirubin, Direct: 0.1 mg/dL (ref 0.0–0.3)
Total Protein: 7 g/dL (ref 6.0–8.3)

## 2011-05-18 LAB — IBC PANEL
Iron: 66 ug/dL (ref 42–145)
Saturation Ratios: 15.7 % — ABNORMAL LOW (ref 20.0–50.0)
Transferrin: 301 mg/dL (ref 212.0–360.0)

## 2011-05-18 LAB — TSH: TSH: 1.52 u[IU]/mL (ref 0.35–5.50)

## 2011-05-18 LAB — VITAMIN B12: Vitamin B-12: 375 pg/mL (ref 211–911)

## 2011-05-18 LAB — SEDIMENTATION RATE: Sed Rate: 1 mm/hr (ref 0–22)

## 2011-05-18 MED ORDER — ESOMEPRAZOLE MAGNESIUM 40 MG PO CPDR: 40.0000 mg | DELAYED_RELEASE_CAPSULE | Freq: Every day | ORAL | Status: DC

## 2011-05-18 MED ORDER — PEG-KCL-NACL-NASULF-NA ASC-C 100 G PO SOLR: 1.0000 | Freq: Once | ORAL | Status: DC

## 2011-05-18 MED ORDER — RIFAXIMIN 550 MG PO TABS: 550.0000 mg | ORAL_TABLET | Freq: Two times a day (BID) | ORAL | Status: DC

## 2011-05-18 MED ORDER — ALIGN PO CAPS: 1.0000 | ORAL_CAPSULE | Freq: Every day | ORAL | Status: DC

## 2011-05-18 MED ORDER — DEXLANSOPRAZOLE 60 MG PO CPDR: 60.0000 mg | DELAYED_RELEASE_CAPSULE | Freq: Every day | ORAL | Status: DC

## 2011-05-18 NOTE — Telephone Encounter (Signed)
Advised pt she would need to call her insurance and find out which PPI they will cover at a less expensive rate, since the Dexilatn is $90. I offered samples of the xifaxan buy she lives in [place] and is already home. She will just purchase it. She will call back with the drug her insurance will offer to her less expensive,.

## 2011-05-18 NOTE — Patient Instructions (Signed)
Your procedure has been scheduled for 05/22/2011, please follow the seperate instructions.  Please go to the basement today for your labs.  Your prescription(s) have been sent to you pharmacy.  Stop your Prilosec and start Dexilant once a day 30 min before breakfast. Take Align once a day while taking Xifaxan. Take benefiber every morning you can buy this OTC.

## 2011-05-18 NOTE — Telephone Encounter (Signed)
rx changed to Nexium since she has already tried and failed prilosec. She can call back if she has questions.

## 2011-05-18 NOTE — Progress Notes (Signed)
History of Present Illness:  This is a somewhat complicated 60 year old Caucasian female with known Crohn's disease of the large and small intestine requiring previous partial bowel resection. She's been in remission for many years and is off all therapy for her IBD at this time. She now complains of 2 months of alternating diarrhea and constipation without rectal bleeding with some lower abdominal discomfort. She has a lot of abdominal gas and bloating excessive flatus. Also has refractory GERD despite taking Prilosec 20 mg a day, and has excessive throat clearing and intermittent dysphagia. She denies fever, chills, skin rashes, joint pains, or oral stomatitis. He does have a history of mild perianal disease. In the past she has had appendectomy, hysterectomy, allergies section. Other medical problems include sleep apnea, kidney stones, hypertension, and chronic anxiety and depression. She denies use of NSAIDs or other anti-inflammatories, alcohol or cigarettes. Her last endoscopic exams were 3 years ago. In the past the patient has been treated for bacterial overgrowth syndrome with good response.  I have reviewed this patient's present history, medical and surgical past history, allergies and medications.     ROS: The remainder of the 10 point ROS is negative: She does complain of chronic low back pain, throat clearing and hoarseness, chronic fatigue, and recurrent headaches. Did have a CVA in 2007 is not on anticoagulants other than aspirin therapy. Her anxiety and depression seems to be under good control as is her hypertension and blood pressure today was normal at 130/80. He also has a history of sleep apnea and uses a CPAP machine. 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  Physical Exam: General well developed well nourished patient in no acute distress, appearing her stated age Eyes PERRLA, no icterus, fundoscopic exam per opthamologist Skin no lesions noted Neck supple, no  adenopathy, no thyroid enlargement, no tenderness Chest clear to percussion and auscultation Heart no significant murmurs, gallops or rubs noted Abdomen no hepatosplenomegaly masses or tenderness, BS normal.  Rectal inspection normal no fissures, or fistulae noted.  No masses or tenderness on digital exam. Stool guaiac negative. Extremities no acute joint lesions, edema, phlebitis or evidence of cellulitis. Neurologic patient oriented x 3, cranial nerves intact, no focal neurologic deficits noted. Psychological mental status normal and normal affect.  Assessment and plan: Probable recurrent bacterial overgrowth syndrome associated with her previous right hemicolectomy. I placed her on Xifaxan 550 mg twice a day for 2 weeks with probiotic therapy. She also has worsening GERD and a probable esophageal stricture, and I have changed her to Dexilant 60 mg a day instead of Prilosec. She is due for followup endoscopy and colonoscopy with propofol sedation. She does not use or abuse sorbitol, fructose, or other non-digestible carbohydrates. She is to continue other medications as per Dr. Arnoldo Morale. Followup labs and B12 level ordered.  Please copy her primary care physician, referring physician, and pertinent subspecialists. Encounter Diagnoses  Name Primary?  . Esophageal reflux   . Diarrhea   . Crohn's disease of both small and large intestine with complication   . Bacterial overgrowth syndrome

## 2011-05-19 LAB — CELIAC PANEL 10
Gliadin IgA: 6.8 U/mL (ref <20)
Tissue Transglut Ab: 2.7 U/mL (ref <20)

## 2011-05-22 ENCOUNTER — Encounter: Payer: Self-pay | Admitting: Gastroenterology

## 2011-05-22 ENCOUNTER — Ambulatory Visit (AMBULATORY_SURGERY_CENTER): Payer: Medicare Other | Admitting: Gastroenterology

## 2011-05-22 DIAGNOSIS — K50819 Crohn's disease of both small and large intestine with unspecified complications: Secondary | ICD-10-CM

## 2011-05-22 DIAGNOSIS — K5090 Crohn's disease, unspecified, without complications: Secondary | ICD-10-CM

## 2011-05-22 DIAGNOSIS — R197 Diarrhea, unspecified: Secondary | ICD-10-CM

## 2011-05-22 DIAGNOSIS — K5289 Other specified noninfective gastroenteritis and colitis: Secondary | ICD-10-CM

## 2011-05-22 DIAGNOSIS — K5080 Crohn's disease of both small and large intestine without complications: Secondary | ICD-10-CM

## 2011-05-22 DIAGNOSIS — K219 Gastro-esophageal reflux disease without esophagitis: Secondary | ICD-10-CM

## 2011-05-22 MED ORDER — SODIUM CHLORIDE 0.9 % IV SOLN: 500.0000 mL | INTRAVENOUS | Status: DC

## 2011-05-22 NOTE — Progress Notes (Signed)
Patient stating she had a hemorrhagic stroke in 2007 related to a spike in b/p. Patient stating she has a prescription for Clonidine that she takes by mouth if her systolic is 961. Patient stating this morning her bp 158/97 , that was the highest at home. Patient stating she has developed a headache since arrival, bp checked with manual bp cuff, 164 systolic, then 353/912. Discussed with Dr. Sharlett Iles . Ordered the patient take Her Clonidine. Sip of water given, and Patient taking he Clonidine 0.1 mg, 1/2 tablet at 1530. Patient stating if her pressure does not come down after 20 minutes, she has been instructed to take the other one half of the Clonidine.

## 2011-05-22 NOTE — Patient Instructions (Signed)
Discharge instructions given with verbal understanding. Resume previous medications.

## 2011-05-23 ENCOUNTER — Telehealth: Payer: Self-pay | Admitting: *Deleted

## 2011-05-23 NOTE — Telephone Encounter (Signed)

## 2011-05-24 DIAGNOSIS — K219 Gastro-esophageal reflux disease without esophagitis: Secondary | ICD-10-CM

## 2011-05-24 LAB — HELICOBACTER PYLORI SCREEN-BIOPSY: UREASE: NEGATIVE

## 2011-05-25 ENCOUNTER — Encounter: Payer: Self-pay | Admitting: Gastroenterology

## 2011-05-29 ENCOUNTER — Encounter: Payer: Self-pay | Admitting: Gastroenterology

## 2011-06-13 ENCOUNTER — Telehealth: Payer: Self-pay | Admitting: Gastroenterology

## 2011-06-14 ENCOUNTER — Encounter: Payer: Self-pay | Admitting: Gastroenterology

## 2011-06-14 NOTE — Telephone Encounter (Signed)
Dr Sharlett Iles, I routed results to you. Please advise. Thanks.

## 2011-06-20 ENCOUNTER — Telehealth: Payer: Self-pay | Admitting: Gastroenterology

## 2011-06-20 NOTE — Telephone Encounter (Signed)
Pt with hx of Crohn's in small and large intestine with partial bowel resection. Last OV 05/18/11 dx with probable bacterial overgrowth syndrome and placed on Xifaxin bid x 2 weeks with Align and Dexilant. She had ECL on 05/22/11 negative for H.Pylori and COLON showed chronic IBD. Pt reports she has diarrhea- loose stools, not watery usually qod. Before she was on Pentasa, but no she's on nothing. Please advise. Thanks.

## 2011-06-21 MED ORDER — MESALAMINE ER 250 MG PO CPCR: ORAL_CAPSULE | ORAL | Status: DC

## 2011-06-21 NOTE — Telephone Encounter (Signed)
lmom for pt to call back

## 2011-06-21 NOTE — Telephone Encounter (Signed)
Informed pt Dr Sharlett Iles prescribed Pentasa for her- same dose listed in med history. Pt stated understanding and requested [facility] in [place].

## 2011-06-21 NOTE — Telephone Encounter (Signed)
Restart pentasa

## 2011-06-22 LAB — CSF CULTURE W GRAM STAIN

## 2011-06-22 LAB — CSF CELL COUNT WITH DIFFERENTIAL
RBC Count, CSF: 0
RBC Count, CSF: 24 — ABNORMAL HIGH
Tube #: 1
Tube #: 4
WBC, CSF: 1

## 2011-06-22 LAB — GRAM STAIN

## 2011-06-22 LAB — COMPREHENSIVE METABOLIC PANEL
ALT: 20
Albumin: 4
Alkaline Phosphatase: 57
BUN: 15
Calcium: 9.5
Glucose, Bld: 116 — ABNORMAL HIGH
Potassium: 5.8 — ABNORMAL HIGH
Sodium: 140
Total Protein: 7

## 2011-06-22 LAB — DIFFERENTIAL
Basophils Absolute: 0
Basophils Relative: 0
Eosinophils Absolute: 0.1
Eosinophils Relative: 1
Monocytes Absolute: 0.3

## 2011-06-22 LAB — POTASSIUM: Potassium: 4

## 2011-06-22 LAB — CBC
MCHC: 33.6
Platelets: 219
RDW: 13.8

## 2011-06-29 NOTE — Telephone Encounter (Signed)
Call just got back to me. This question was answered on 06/20/11 call.

## 2011-07-26 ENCOUNTER — Other Ambulatory Visit: Payer: Self-pay | Admitting: Gastroenterology

## 2011-07-28 ENCOUNTER — Ambulatory Visit: Payer: Medicare Other | Admitting: Internal Medicine

## 2011-08-22 ENCOUNTER — Ambulatory Visit: Payer: Medicare Other | Admitting: Internal Medicine

## 2011-08-25 ENCOUNTER — Ambulatory Visit: Payer: Medicare Other | Admitting: Internal Medicine

## 2011-09-07 ENCOUNTER — Telehealth: Payer: Self-pay | Admitting: Internal Medicine

## 2011-09-07 NOTE — Telephone Encounter (Signed)
Ov  tomorrow at 11:45-pt informed

## 2011-09-07 NOTE — Telephone Encounter (Signed)
Pt was sch to come in on 09/22/11, but was having to be rschd due to pcp not being avail that day. Pt is req to either come in to see Dr Arnoldo Morale sooner, or get a call back from nurse re: stroke pt had 5 yrs ago. Pt is having high blood pressure issues and headaches. Pt refuses to see another dr. Abbott Pao said that she has been having to take her Clonidine very frequently. Pls call.

## 2011-09-08 ENCOUNTER — Ambulatory Visit (INDEPENDENT_AMBULATORY_CARE_PROVIDER_SITE_OTHER): Payer: Medicare Other | Admitting: Internal Medicine

## 2011-09-08 ENCOUNTER — Encounter: Payer: Self-pay | Admitting: Internal Medicine

## 2011-09-08 VITALS — BP 144/90 | HR 60 | Temp 98.3°F | Resp 16 | Ht 61.5 in | Wt 154.0 lb

## 2011-09-08 DIAGNOSIS — K5289 Other specified noninfective gastroenteritis and colitis: Secondary | ICD-10-CM

## 2011-09-08 DIAGNOSIS — I10 Essential (primary) hypertension: Secondary | ICD-10-CM

## 2011-09-08 DIAGNOSIS — E785 Hyperlipidemia, unspecified: Secondary | ICD-10-CM

## 2011-09-08 DIAGNOSIS — K529 Noninfective gastroenteritis and colitis, unspecified: Secondary | ICD-10-CM

## 2011-09-08 DIAGNOSIS — R4586 Emotional lability: Secondary | ICD-10-CM

## 2011-09-08 DIAGNOSIS — F39 Unspecified mood [affective] disorder: Secondary | ICD-10-CM

## 2011-09-08 DIAGNOSIS — M542 Cervicalgia: Secondary | ICD-10-CM

## 2011-09-08 MED ORDER — ROSUVASTATIN CALCIUM 20 MG PO TABS: 20.0000 mg | ORAL_TABLET | ORAL | Status: DC

## 2011-09-08 MED ORDER — CLONIDINE HCL ER 0.1 MG PO TB12: ORAL_TABLET | ORAL | Status: DC

## 2011-09-08 MED ORDER — MESALAMINE ER 250 MG PO CPCR: ORAL_CAPSULE | ORAL | Status: DC

## 2011-09-08 MED ORDER — PROPRANOLOL HCL 60 MG PO CP24: 60.0000 mg | ORAL_CAPSULE | Freq: Two times a day (BID) | ORAL | Status: DC

## 2011-09-08 MED ORDER — AMLODIPINE BESYLATE 2.5 MG PO TABS: 2.5000 mg | ORAL_TABLET | Freq: Every day | ORAL | Status: DC

## 2011-09-08 MED ORDER — DULOXETINE HCL 30 MG PO CPEP: 30.0000 mg | ORAL_CAPSULE | Freq: Every day | ORAL | Status: DC

## 2011-09-08 MED ORDER — TRAMADOL HCL 50 MG PO TABS: 50.0000 mg | ORAL_TABLET | Freq: Three times a day (TID) | ORAL | Status: AC | PRN

## 2011-09-08 NOTE — Patient Instructions (Signed)
The patient is instructed to continue all medications as prescribed. Schedule followup with check out clerk upon leaving the clinic  

## 2011-09-08 NOTE — Progress Notes (Signed)
Subjective:    Patient ID: Julia [name], female    DOB: 1950-12-29, 60 y.o.   MRN: [id number]  HPI This is a very variable blood pressure with elevated blood pressures as high as 180 and as low as 110 average blood pressures appear to be in the 140 range today she presents with a blood pressure of about 144/90 her pulse is 60 and regular. Her current medications are amlodipine, clonidine and Inderal She takes Inderal at night and notes that the blood pressure commonly spikes just before she takes the Inderal  Needs refill of all medications discussed the continued need for the cymbalta Reviewed the NSAID used for pain     Review of Systems  Constitutional: Negative for activity change, appetite change and fatigue.  HENT: Negative for ear pain, congestion, neck pain, postnasal drip and sinus pressure.   Eyes: Negative for redness and visual disturbance.  Respiratory: Negative for cough, shortness of breath and wheezing.   Gastrointestinal: Negative for abdominal pain and abdominal distention.  Genitourinary: Negative for dysuria, frequency and menstrual problem.  Musculoskeletal: Negative for myalgias, joint swelling and arthralgias.  Skin: Negative for rash and wound.  Neurological: Negative for dizziness, weakness and headaches.  Hematological: Negative for adenopathy. Does not bruise/bleed easily.  Psychiatric/Behavioral: Negative for sleep disturbance and decreased concentration.   Past Medical History  Diagnosis Date  . Anal fissure 04/29/2008  . BACK PAIN, LUMBAR, WITH RADICULOPATHY 09/21/2008  . BREAST CYSTS, BILATERAL 10/01/2007  . Carpal tunnel syndrome   . CEREBRAL ANEURYSM   . CHEST DISCOMFORT   . CHOLELITHIASIS   . COLITIS   . COMMON MIGRAINE   . CONSTIPATION   . Cough variant asthma   . CROHN'S DISEASE   . DEGENERATIVE DISC DISEASE, CERVICAL SPINE   . Diarrhea   . DIZZINESS, CHRONIC   . FACIAL PARESTHESIA, LEFT   . GERD   . HIATAL HERNIA   .  HYPERLIPIDEMIA, BORDERLINE   . HYPERTENSION   . ORGANIC INSOMNIA UNSPECIFIED   . Sarcoidosis   . SLEEP APNEA   . UNSPECIFIED VENOUS INSUFFICIENCY   . CVA (cerebrovascular accident due to intracerebral hemorrhage)     History   Social History  . Marital Status: Married    Spouse Name: N/A    Number of Children: 2  . Years of Education: N/A   Occupational History  . retired    Social History Main Topics  . Smoking status: Former Smoker    Quit date: 09/12/1979  . Smokeless tobacco: Not on file   Comment: has been quit for 35 years  . Alcohol Use: 4.2 oz/week    7 Glasses of wine per week  . Drug Use: No  . Sexually Active: Not on file   Other Topics Concern  . Not on file   Social History Narrative  . No narrative on file    Past Surgical History  Procedure Date  . Breast lumpectomy   . Colectomy 1981    right  . Carpal tunnel release   . Abdominal hysterectomy   . Tonsillectomy     Family History  Problem Relation Age of Onset  . Stomach cancer Father   . Esophageal cancer Father   . Colon cancer Neg Hx     Allergies  Allergen Reactions  . Meperidine Hcl     REACTION: vomiting  . Penicillins     REACTION: rash    Current Outpatient Prescriptions on File Prior to Visit  Medication Sig Dispense  Refill  . aspirin 81 MG tablet Take 1 tablet (81 mg total) by mouth daily. Monday Wednesday and Friday      . butalbital-acetaminophen-caffeine (FIORICET, ESGIC) 50-325-40 MG per tablet Take 1 tablet by mouth 2 (two) times daily as needed.        . fexofenadine (ALLEGRA) 180 MG tablet Take 1 tablet (180 mg total) by mouth daily.  30 tablet  11  . Flaxseed, Linseed, (FLAXSEED OIL) 1000 MG CAPS Take by mouth daily.        Marland Kitchen NEXIUM 40 MG capsule TAKE 1 CAPSULE DAILY  30 capsule  1  . triazolam (HALCION) 0.25 MG tablet Take 1 tablet (0.25 mg total) by mouth at bedtime as needed.  30 tablet  5    BP 144/90  Pulse 60  Temp 98.3 F (36.8 C)  Resp 16  Ht 5'  1.5" (1.562 m)  Wt 154 lb (69.854 kg)  BMI 28.63 kg/m2        Objective:   Physical Exam  Nursing note and vitals reviewed. Constitutional: She is oriented to person, place, and time. She appears well-developed and well-nourished. No distress.  HENT:  Head: Normocephalic and atraumatic.  Right Ear: External ear normal.  Left Ear: External ear normal.  Nose: Nose normal.  Mouth/Throat: Oropharynx is clear and moist.  Eyes: Conjunctivae and EOM are normal. Pupils are equal, round, and reactive to light.  Neck: Normal range of motion. Neck supple. No JVD present. No tracheal deviation present. No thyromegaly present.  Cardiovascular: Normal rate, regular rhythm, normal heart sounds and intact distal pulses.   No murmur heard. Pulmonary/Chest: Effort normal and breath sounds normal. She has no wheezes. She exhibits no tenderness.  Abdominal: Soft. Bowel sounds are normal.  Musculoskeletal: Normal range of motion. She exhibits no edema and no tenderness.  Lymphadenopathy:    She has no cervical adenopathy.  Neurological: She is alert and oriented to person, place, and time. She has normal reflexes. No cranial nerve deficit.  Skin: Skin is warm and dry. She is not diaphoretic.  Psychiatric: She has a normal mood and affect. Her behavior is normal.          Assessment & Plan:  History of migraine headaches and vertigo controlled hypertension we will change her Inderal LA from 80 once a day to 60 extended release twice daily hopefully this won't even out her beta blocker control as well as help to be a greater prophylaxis for her migraine headaches.  The patient discussed her Cymbalta used and we encouraged her to continue on the medication she has moderate GERD and we gave her samples of AcipHex as a replacement for Nexium to their cost we also discussed her chronic neck pain and will use tramadol for the neck pain as this maybe could admitting to some of the lability of her blood  pressure when she experiences pain

## 2011-09-11 ENCOUNTER — Telehealth: Payer: Self-pay | Admitting: *Deleted

## 2011-09-11 NOTE — Telephone Encounter (Signed)
Pt states she is experiencing side effects from ultram.  She has had a severe headache since Friday night, requesting another medication.  Also needs refills of the following meds:  cymbalta nexium norvasc fioricet    Pharmacy is [place], [place] [phone]

## 2011-09-13 ENCOUNTER — Other Ambulatory Visit: Payer: Self-pay | Admitting: *Deleted

## 2011-09-13 DIAGNOSIS — R4586 Emotional lability: Secondary | ICD-10-CM

## 2011-09-13 DIAGNOSIS — I10 Essential (primary) hypertension: Secondary | ICD-10-CM

## 2011-09-13 MED ORDER — DULOXETINE HCL 30 MG PO CPEP: 30.0000 mg | ORAL_CAPSULE | Freq: Every day | ORAL | Status: DC

## 2011-09-13 MED ORDER — AMLODIPINE BESYLATE 2.5 MG PO TABS: 2.5000 mg | ORAL_TABLET | Freq: Every day | ORAL | Status: DC

## 2011-09-13 NOTE — Telephone Encounter (Signed)
Talked with pt and she states not all meds were sent in- states took ultram on Friday night and woke up with headache on saturday am.  Stopped ultram and headache stopped--slight headache that lasted 1 day---------------------------------------------called in fioricet and escribed norvasc and cymbalta

## 2011-09-13 NOTE — Telephone Encounter (Signed)
May refill other drugs Stop the ultram Hesitant to add narcotics Please discuss pain . Timing and severity with pt.

## 2011-09-13 NOTE — Telephone Encounter (Signed)
Right now ,dr Arnoldo Morale doesn't want to replace the ultram

## 2011-09-22 ENCOUNTER — Ambulatory Visit: Payer: Medicare Other | Admitting: Internal Medicine

## 2011-10-17 ENCOUNTER — Other Ambulatory Visit: Payer: Self-pay | Admitting: *Deleted

## 2011-10-17 MED ORDER — TRIAZOLAM 0.25 MG PO TABS: 0.2500 mg | ORAL_TABLET | Freq: Every evening | ORAL | Status: DC | PRN

## 2011-11-03 ENCOUNTER — Other Ambulatory Visit: Payer: Self-pay | Admitting: Internal Medicine

## 2011-11-03 DIAGNOSIS — Z1231 Encounter for screening mammogram for malignant neoplasm of breast: Secondary | ICD-10-CM

## 2011-11-08 ENCOUNTER — Encounter: Payer: Self-pay | Admitting: Gastroenterology

## 2011-11-14 ENCOUNTER — Other Ambulatory Visit: Payer: Self-pay | Admitting: Internal Medicine

## 2011-11-14 ENCOUNTER — Ambulatory Visit
Admission: RE | Admit: 2011-11-14 | Discharge: 2011-11-14 | Disposition: A | Discharge status: Home / Self Care | Payer: Medicare Other | Source: Ambulatory Visit | Attending: Internal Medicine | Admitting: Internal Medicine

## 2011-11-14 DIAGNOSIS — Z1231 Encounter for screening mammogram for malignant neoplasm of breast: Secondary | ICD-10-CM

## 2011-11-14 DIAGNOSIS — N644 Mastodynia: Secondary | ICD-10-CM

## 2011-11-15 ENCOUNTER — Ambulatory Visit (INDEPENDENT_AMBULATORY_CARE_PROVIDER_SITE_OTHER): Payer: Medicare Other | Admitting: Internal Medicine

## 2011-11-15 ENCOUNTER — Encounter: Payer: Self-pay | Admitting: Internal Medicine

## 2011-11-15 VITALS — BP 138/80 | HR 76 | Temp 98.2°F | Resp 16 | Ht 61.5 in | Wt 160.0 lb

## 2011-11-15 DIAGNOSIS — K219 Gastro-esophageal reflux disease without esophagitis: Secondary | ICD-10-CM

## 2011-11-15 DIAGNOSIS — J45909 Unspecified asthma, uncomplicated: Secondary | ICD-10-CM

## 2011-11-15 DIAGNOSIS — I10 Essential (primary) hypertension: Secondary | ICD-10-CM

## 2011-11-15 MED ORDER — PROPRANOLOL HCL ER 60 MG PO CP24: 60.0000 mg | ORAL_CAPSULE | Freq: Every day | ORAL | Status: DC

## 2011-11-15 MED ORDER — PROPRANOLOL HCL ER 60 MG PO CP24: 60.0000 mg | ORAL_CAPSULE | Freq: Two times a day (BID) | ORAL | Status: DC

## 2011-11-15 MED ORDER — OMEPRAZOLE 40 MG PO CPDR: 40.0000 mg | DELAYED_RELEASE_CAPSULE | Freq: Every day | ORAL | Status: DC

## 2011-11-15 NOTE — Progress Notes (Signed)
Subjective:    Patient ID: Julia [name], female    DOB: [birth year], 61 y.o.   MRN: [id number]  HPI The patient is a 61 year old female who presents for followup of hypertension with a chief complaint today of a sense of hoarseness and sense of needing to clear her throat and periodic coughing that is sometimes severe enough to result in reactive airway disease with wheezing.  The patient does not feel ill nor did she bring up anything. Color but she does have a chronic sense of congestion and cough.  Periodically she does experience chest tightness with this..      Review of Systems  Constitutional: Negative for activity change, appetite change and fatigue.  HENT: Negative for ear pain, congestion, neck pain, postnasal drip and sinus pressure.   Eyes: Negative for redness and visual disturbance.  Respiratory: Negative for cough, shortness of breath and wheezing.   Gastrointestinal: Negative for abdominal pain and abdominal distention.  Genitourinary: Negative for dysuria, frequency and menstrual problem.  Musculoskeletal: Negative for myalgias, joint swelling and arthralgias.  Skin: Negative for rash and wound.  Neurological: Negative for dizziness, weakness and headaches.  Hematological: Negative for adenopathy. Does not bruise/bleed easily.  Psychiatric/Behavioral: Negative for sleep disturbance and decreased concentration.   Past Medical History  Diagnosis Date  . Anal fissure 04/29/2008  . BACK PAIN, LUMBAR, WITH RADICULOPATHY 09/21/2008  . BREAST CYSTS, BILATERAL 10/01/2007  . Carpal tunnel syndrome   . CEREBRAL ANEURYSM   . CHEST DISCOMFORT   . CHOLELITHIASIS   . COLITIS   . COMMON MIGRAINE   . CONSTIPATION   . Cough variant asthma   . CROHN'S DISEASE   . DEGENERATIVE DISC DISEASE, CERVICAL SPINE   . Diarrhea   . DIZZINESS, CHRONIC   . FACIAL PARESTHESIA, LEFT   . GERD   . HIATAL HERNIA   . HYPERLIPIDEMIA, BORDERLINE   . HYPERTENSION   . ORGANIC INSOMNIA  UNSPECIFIED   . Sarcoidosis   . SLEEP APNEA   . UNSPECIFIED VENOUS INSUFFICIENCY   . CVA (cerebrovascular accident due to intracerebral hemorrhage)     History   Social History  . Marital Status: Married    Spouse Name: N/A    Number of Children: 2  . Years of Education: N/A   Occupational History  . retired    Social History Main Topics  . Smoking status: Former Smoker    Quit date: 09/12/1979  . Smokeless tobacco: Not on file   Comment: has been quit for 35 years  . Alcohol Use: 4.2 oz/week    7 Glasses of wine per week  . Drug Use: No  . Sexually Active: Not on file   Other Topics Concern  . Not on file   Social History Narrative  . No narrative on file    Past Surgical History  Procedure Date  . Breast lumpectomy   . Colectomy 1981    right  . Carpal tunnel release   . Abdominal hysterectomy   . Tonsillectomy     Family History  Problem Relation Age of Onset  . Stomach cancer Father   . Esophageal cancer Father   . Colon cancer Neg Hx     Allergies  Allergen Reactions  . Meperidine Hcl     REACTION: vomiting  . Penicillins     REACTION: rash    Current Outpatient Prescriptions on File Prior to Visit  Medication Sig Dispense Refill  . amLODipine (NORVASC) 2.5 MG tablet Take 1 tablet (  2.5 mg total) by mouth daily.  30 tablet  11  . aspirin 81 MG tablet Take 1 tablet (81 mg total) by mouth daily. Monday Wednesday and Friday      . butalbital-acetaminophen-caffeine (FIORICET, ESGIC) 50-325-40 MG per tablet Take 1 tablet by mouth 2 (two) times daily as needed.        . cloNIDine HCl (KAPVAY) 0.1 MG TB12 ER tablet 1/2 tab as needed for symptomatic bp >170  30 tablet  11  . DULoxetine (CYMBALTA) 30 MG capsule Take 1 capsule (30 mg total) by mouth daily.  30 capsule  11  . fexofenadine (ALLEGRA) 180 MG tablet Take 1 tablet (180 mg total) by mouth daily.  30 tablet  11  . Flaxseed, Linseed, (FLAXSEED OIL) 1000 MG CAPS Take by mouth daily.        .  mesalamine (PENTASA) 250 MG CR capsule Take 2 capsules by mouth at bedtime.  60 capsule  3  . NEXIUM 40 MG capsule TAKE 1 CAPSULE DAILY  30 capsule  1  . propranolol (INDERAL LA) 60 MG 24 hr capsule Take 1 capsule (60 mg total) by mouth 2 (two) times daily.  60 capsule  11  . rosuvastatin (CRESTOR) 20 MG tablet Take 1 tablet (20 mg total) by mouth once a week.  5 tablet  0  . triazolam (HALCION) 0.25 MG tablet Take 1 tablet (0.25 mg total) by mouth at bedtime as needed.  30 tablet  5    BP 138/80  Pulse 76  Temp 98.2 F (36.8 C)  Resp 16  Ht 5' 1.5" (1.562 m)  Wt 160 lb (72.576 kg)  BMI 29.74 kg/m2       Objective:   Physical Exam  Nursing note and vitals reviewed. Constitutional: She is oriented to person, place, and time. She appears well-developed and well-nourished. No distress.  HENT:  Head: Normocephalic and atraumatic.  Right Ear: External ear normal.  Left Ear: External ear normal.  Nose: Nose normal.  Mouth/Throat: Oropharynx is clear and moist.  Eyes: Conjunctivae and EOM are normal. Pupils are equal, round, and reactive to light.  Neck: Normal range of motion. Neck supple. No JVD present. No tracheal deviation present. No thyromegaly present.  Cardiovascular: Normal rate, regular rhythm, normal heart sounds and intact distal pulses.   No murmur heard. Pulmonary/Chest: Effort normal and breath sounds normal. She has no wheezes. She exhibits no tenderness.  Abdominal: Soft. Bowel sounds are normal.  Musculoskeletal: Normal range of motion. She exhibits no edema and no tenderness.  Lymphadenopathy:    She has no cervical adenopathy.  Neurological: She is alert and oriented to person, place, and time. She has normal reflexes. No cranial nerve deficit.  Skin: Skin is warm and dry. She is not diaphoretic.  Psychiatric: She has a normal mood and affect. Her behavior is normal.          Assessment & Plan:  Trial of pulmicort for reactive airway disease.  Given her  history of sarcoidosis of the cough and congestion persists. Blood pressure is stable on present medication.  History of CVA with no neurologic symptoms currently stable on medication.  History of insomnia and history of migraines stable pattern

## 2011-11-23 ENCOUNTER — Ambulatory Visit
Admission: RE | Admit: 2011-11-23 | Discharge: 2011-11-23 | Disposition: A | Discharge status: Home / Self Care | Payer: Medicare Other | Source: Ambulatory Visit | Attending: Internal Medicine | Admitting: Internal Medicine

## 2011-11-23 DIAGNOSIS — N644 Mastodynia: Secondary | ICD-10-CM

## 2011-11-30 ENCOUNTER — Other Ambulatory Visit: Payer: Self-pay | Admitting: Internal Medicine

## 2012-01-04 ENCOUNTER — Telehealth: Payer: Self-pay | Admitting: *Deleted

## 2012-01-04 MED ORDER — FLUCONAZOLE 100 MG PO TABS: 100.0000 mg | ORAL_TABLET | Freq: Every day | ORAL | Status: AC

## 2012-01-04 NOTE — Telephone Encounter (Signed)
Per dr Arnoldo Morale- may have diflucan 100 qd for 7 days

## 2012-01-04 NOTE — Telephone Encounter (Signed)
Pt is having vaginal itching and burning, no discharge.  Took 2 Monistat treatments back to back, and still no better.  Wants to see Dr. Arnoldo Morale and will be available Tuesday if he can work her in?

## 2012-01-04 NOTE — Telephone Encounter (Signed)
Addended by: Bea Laura D on: 01/04/2012 01:35 PM   Modules accepted: Orders

## 2012-01-04 NOTE — Telephone Encounter (Signed)
Notified pt. 

## 2012-01-15 ENCOUNTER — Telehealth: Payer: Self-pay | Admitting: *Deleted

## 2012-01-15 NOTE — Telephone Encounter (Signed)
Would be best to come and see padonda tomorrow--then we an run a urinalysis and c and s per dr Matthew Saras

## 2012-01-15 NOTE — Telephone Encounter (Signed)
Pt took a 7 day course of Diflucan, and is having burning in the vaginal area again...wants to know if she should take another course of anti fungals or see the doctor.  Treatment was April 25.

## 2012-01-15 NOTE — Telephone Encounter (Signed)
Pt will call later this week if no better and needs appt.

## 2012-01-22 ENCOUNTER — Ambulatory Visit (INDEPENDENT_AMBULATORY_CARE_PROVIDER_SITE_OTHER): Payer: Medicare Other | Admitting: Family

## 2012-01-22 ENCOUNTER — Other Ambulatory Visit: Payer: Self-pay | Admitting: Family

## 2012-01-22 ENCOUNTER — Encounter: Payer: Self-pay | Admitting: Family

## 2012-01-22 VITALS — BP 128/70 | Temp 98.3°F | Wt 158.0 lb

## 2012-01-22 DIAGNOSIS — R300 Dysuria: Secondary | ICD-10-CM

## 2012-01-22 LAB — POCT URINALYSIS DIPSTICK
Nitrite, UA: NEGATIVE
Protein, UA: NEGATIVE
pH, UA: 6

## 2012-01-22 MED ORDER — METRONIDAZOLE 0.75 % VA GEL: 1.0000 | Freq: Every day | VAGINAL | Status: AC

## 2012-01-22 MED ORDER — FUROSEMIDE 20 MG PO TABS: 20.0000 mg | ORAL_TABLET | Freq: Every day | ORAL | Status: DC

## 2012-01-22 NOTE — Progress Notes (Signed)
Subjective:    Patient ID: Julia [name], female    DOB: Nov 05, 1950, 61 y.o.   MRN: [id number]  HPI 61 year old white female, nonsmoker, patient of Dr. Arnoldo Morale is in with vaginal irritation x2 weeks. She's been using over-the-counter Monistat and Diflucan that has been ineffective. She describes the discomfort as a burning sensation, with a fishy and occasionally musty odor. Patient reports recently change in her lubricant. Sleep she is sexually active with one female partner and has been for over 40 years. She denies any concerns or any sexually transmitted diseases. Denies any vaginal bleeding.  Review of Systems  Constitutional: Negative.   Respiratory: Negative.   Cardiovascular: Negative.   Gastrointestinal: Negative.   Genitourinary: Positive for dysuria and vaginal discharge. Negative for hematuria, vaginal bleeding and pelvic pain.  Hematological: Negative.   Psychiatric/Behavioral: Negative.    Past Medical History  Diagnosis Date  . Anal fissure 04/29/2008  . BACK PAIN, LUMBAR, WITH RADICULOPATHY 09/21/2008  . BREAST CYSTS, BILATERAL 10/01/2007  . Carpal tunnel syndrome   . CEREBRAL ANEURYSM   . CHEST DISCOMFORT   . CHOLELITHIASIS   . COLITIS   . COMMON MIGRAINE   . CONSTIPATION   . Cough variant asthma   . CROHN'S DISEASE   . DEGENERATIVE DISC DISEASE, CERVICAL SPINE   . Diarrhea   . DIZZINESS, CHRONIC   . FACIAL PARESTHESIA, LEFT   . GERD   . HIATAL HERNIA   . HYPERLIPIDEMIA, BORDERLINE   . HYPERTENSION   . ORGANIC INSOMNIA UNSPECIFIED   . Sarcoidosis   . SLEEP APNEA   . UNSPECIFIED VENOUS INSUFFICIENCY   . CVA (cerebrovascular accident due to intracerebral hemorrhage)     History   Social History  . Marital Status: Married    Spouse Name: N/A    Number of Children: 2  . Years of Education: N/A   Occupational History  . retired    Social History Main Topics  . Smoking status: Former Smoker    Quit date: 09/12/1979  . Smokeless tobacco: Not on  file   Comment: has been quit for 35 years  . Alcohol Use: 4.2 oz/week    7 Glasses of wine per week  . Drug Use: No  . Sexually Active: Not on file   Other Topics Concern  . Not on file   Social History Narrative  . No narrative on file    Past Surgical History  Procedure Date  . Breast lumpectomy   . Colectomy 1981    right  . Carpal tunnel release   . Abdominal hysterectomy   . Tonsillectomy     Family History  Problem Relation Age of Onset  . Stomach cancer Father   . Esophageal cancer Father   . Colon cancer Neg Hx     Allergies  Allergen Reactions  . Meperidine Hcl     REACTION: vomiting  . Penicillins     REACTION: rash    Current Outpatient Prescriptions on File Prior to Visit  Medication Sig Dispense Refill  . amLODipine (NORVASC) 2.5 MG tablet Take 1 tablet (2.5 mg total) by mouth daily.  30 tablet  11  . aspirin 81 MG tablet Take 1 tablet (81 mg total) by mouth daily. Monday Wednesday and Friday      . butalbital-acetaminophen-caffeine (FIORICET, ESGIC) 50-325-40 MG per tablet TAKE 1 TABLET BY MOUTH TWICE A DAY AS NEEDED FOR HEADACHE  60 tablet  1  . cloNIDine HCl (KAPVAY) 0.1 MG TB12 ER tablet 1/2  tab as needed for symptomatic bp >170  30 tablet  11  . DULoxetine (CYMBALTA) 30 MG capsule Take 1 capsule (30 mg total) by mouth daily.  30 capsule  11  . fexofenadine (ALLEGRA) 180 MG tablet Take 1 tablet (180 mg total) by mouth daily.  30 tablet  11  . Flaxseed, Linseed, (FLAXSEED OIL) 1000 MG CAPS Take by mouth daily.        . mesalamine (PENTASA) 250 MG CR capsule Take 2 capsules by mouth at bedtime.  60 capsule  3  . omeprazole (PRILOSEC) 40 MG capsule Take 1 capsule (40 mg total) by mouth daily.  90 capsule  3  . propranolol (INDERAL LA) 60 MG 24 hr capsule Take 1 capsule (60 mg total) by mouth 2 (two) times daily.  60 capsule  11  . propranolol (INDERAL LA) 60 MG 24 hr capsule Take 1 capsule (60 mg total) by mouth 2 (two) times daily.  180 capsule  3    . propranolol (INDERAL LA) 60 MG 24 hr capsule Take 1 capsule (60 mg total) by mouth daily.  180 capsule  3  . rosuvastatin (CRESTOR) 20 MG tablet Take 1 tablet (20 mg total) by mouth once a week.  5 tablet  0  . triazolam (HALCION) 0.25 MG tablet Take 1 tablet (0.25 mg total) by mouth at bedtime as needed.  30 tablet  5  . DULoxetine (CYMBALTA) 30 MG capsule Take 1 capsule (30 mg total) by mouth daily.  30 capsule  0  . furosemide (LASIX) 20 MG tablet Take 1 tablet (20 mg total) by mouth daily.  30 tablet  3    BP 128/70  Temp(Src) 98.3 F (36.8 C) (Oral)  Wt 158 lb (71.668 kg)chart    Objective:   Physical Exam  Constitutional: She is oriented to person, place, and time. She appears well-developed and well-nourished.  Neck: Normal range of motion. Neck supple.  Cardiovascular: Normal rate, regular rhythm and normal heart sounds.   Pulmonary/Chest: Effort normal and breath sounds normal.  Abdominal: Soft. Bowel sounds are normal.  Genitourinary: Vaginal discharge found.       Fishy odor.  Musculoskeletal: Normal range of motion.  Neurological: She is oriented to person, place, and time.  Skin: Skin is warm and dry.  Psychiatric: She has a normal mood and affect.          Assessment & Plan:  Assessment: Bacterial vaginosis, vaginitis  Plan: MetroGel-Vaginal one applicator full at bedtime x5 nights. Patient to call the office if her symptoms worsen or persist. Recheck a schedule, when necessary.

## 2012-01-22 NOTE — Patient Instructions (Addendum)
Bacterial Vaginosis Bacterial vaginosis (BV) is a vaginal infection where the normal balance of bacteria in the vagina is disrupted. The normal balance is then replaced by an overgrowth of certain bacteria. There are several different kinds of bacteria that can cause BV. BV is the most common vaginal infection in women of childbearing age. CAUSES   The cause of BV is not fully understood. BV develops when there is an increase or imbalance of harmful bacteria.   Some activities or behaviors can upset the normal balance of bacteria in the vagina and put women at increased risk including:   Having a new sex partner or multiple sex partners.   Douching.   Using an intrauterine device (IUD) for contraception.   It is not clear what role sexual activity plays in the development of BV. However, women that have never had sexual intercourse are rarely infected with BV.  Women do not get BV from toilet seats, bedding, swimming pools or from touching objects around them.  SYMPTOMS   Grey vaginal discharge.   A fish-like odor with discharge, especially after sexual intercourse.   Itching or burning of the vagina and vulva.   Burning or pain with urination.   Some women have no signs or symptoms at all.  DIAGNOSIS  Your caregiver must examine the vagina for signs of BV. Your caregiver will perform lab tests and look at the sample of vaginal fluid through a microscope. They will look for bacteria and abnormal cells (clue cells), a pH test higher than 4.5, and a positive amine test all associated with BV.  RISKS AND COMPLICATIONS   Pelvic inflammatory disease (PID).   Infections following gynecology surgery.   Developing HIV.   Developing herpes virus.  TREATMENT  Sometimes BV will clear up without treatment. However, all women with symptoms of BV should be treated to avoid complications, especially if gynecology surgery is planned. Female partners generally do not need to be treated. However,  BV may spread between female sex partners so treatment is helpful in preventing a recurrence of BV.   BV may be treated with antibiotics. The antibiotics come in either pill or vaginal cream forms. Either can be used with nonpregnant or pregnant women, but the recommended dosages differ. These antibiotics are not harmful to the baby.   BV can recur after treatment. If this happens, a second round of antibiotics will often be prescribed.   Treatment is important for pregnant women. If not treated, BV can cause a premature delivery, especially for a pregnant woman who had a premature birth in the past. All pregnant women who have symptoms of BV should be checked and treated.   For chronic reoccurrence of BV, treatment with a type of prescribed gel vaginally twice a week is helpful.  HOME CARE INSTRUCTIONS   Finish all medication as directed by your caregiver.   Do not have sex until treatment is completed.   Tell your sexual partner that you have a vaginal infection. They should see their caregiver and be treated if they have problems, such as a mild rash or itching.   Practice safe sex. Use condoms. Only have 1 sex partner.  PREVENTION  Basic prevention steps can help reduce the risk of upsetting the natural balance of bacteria in the vagina and developing BV:  Do not have sexual intercourse (be abstinent).   Do not douche.   Use all of the medicine prescribed for treatment of BV, even if the signs and symptoms go away.  Tell your sex partner if you have BV. That way, they can be treated, if needed, to prevent reoccurrence.  SEEK MEDICAL CARE IF:   Your symptoms are not improving after 3 days of treatment.   You have increased discharge, pain, or fever.  MAKE SURE YOU:   Understand these instructions.   Will watch your condition.   Will get help right away if you are not doing well or get worse.  FOR MORE INFORMATION  Division of STD Prevention (DSTDP), Centers for Disease  Control and Prevention: [employer] [place] (ASHA): www.ashastd.org  Document Released: 08/28/2005 Document Revised: 08/17/2011 Document Reviewed: 02/18/2009 [facility] Patient Information 2012 [place].

## 2012-01-23 ENCOUNTER — Encounter: Payer: Self-pay | Admitting: Family

## 2012-01-23 ENCOUNTER — Telehealth: Payer: Self-pay | Admitting: *Deleted

## 2012-01-23 NOTE — Telephone Encounter (Signed)
Left message on machine For pt

## 2012-01-23 NOTE — Telephone Encounter (Signed)
No. She is not to take lasix. We only discussed the metrogel. Lasix was sent by error and cancelled.

## 2012-01-23 NOTE — Telephone Encounter (Signed)
Pt saw padonda yesterday with dx of vaginosis and was given metro gel vag supp, but also had lasix when she picked up metro gel.. Is she suppose to be taking that also? She has never had lasix  before. Thanks

## 2012-01-26 ENCOUNTER — Telehealth: Payer: Self-pay

## 2012-01-26 NOTE — Telephone Encounter (Signed)
Patient called stating she was seen on Monday by Dr.Campbell. Patient received message that her test results were normal in which she is glad about, patient would like to know what her DX was.   Patient also indicated that she is using vaginal gel and she is much better.  Please advise

## 2012-01-30 NOTE — Telephone Encounter (Signed)
Triage VM:  Pt is calling ago.  Pt states she came in about 1 week ago for an issue and some test were ran and pt found out the results were normal.  Pt states the message did not say what the diagnosis was.  Pt states she has finished the vaginal cream and would like to know if she needs to have a refill on this.  Pt states she is still having some of the same symptoms.  Pls advise.

## 2012-01-30 NOTE — Telephone Encounter (Signed)
Left message on personally identified voicemail to notify pt that dx was bacterial vaginitis. Advised pt, per NP, we can refer to GYN, however sxs may be due to menopause. Advised pt to call back with questions or concerns

## 2012-02-01 ENCOUNTER — Other Ambulatory Visit: Payer: Self-pay | Admitting: *Deleted

## 2012-02-01 DIAGNOSIS — I10 Essential (primary) hypertension: Secondary | ICD-10-CM

## 2012-02-01 MED ORDER — AMLODIPINE BESYLATE 2.5 MG PO TABS: 2.5000 mg | ORAL_TABLET | Freq: Every day | ORAL | Status: DC

## 2012-02-01 MED ORDER — PROPRANOLOL HCL ER 60 MG PO CP24: 60.0000 mg | ORAL_CAPSULE | Freq: Two times a day (BID) | ORAL | Status: DC

## 2012-02-09 ENCOUNTER — Ambulatory Visit: Payer: Medicare Other | Admitting: Internal Medicine

## 2012-03-06 ENCOUNTER — Other Ambulatory Visit: Payer: Self-pay | Admitting: Internal Medicine

## 2012-03-21 ENCOUNTER — Other Ambulatory Visit: Payer: Self-pay | Admitting: Internal Medicine

## 2012-04-01 ENCOUNTER — Encounter: Payer: Self-pay | Admitting: Internal Medicine

## 2012-04-01 ENCOUNTER — Ambulatory Visit (INDEPENDENT_AMBULATORY_CARE_PROVIDER_SITE_OTHER): Payer: Medicare Other | Admitting: Internal Medicine

## 2012-04-01 VITALS — BP 136/80 | HR 72 | Temp 98.3°F | Resp 16 | Ht 61.5 in | Wt 156.0 lb

## 2012-04-01 DIAGNOSIS — R059 Cough, unspecified: Secondary | ICD-10-CM

## 2012-04-01 DIAGNOSIS — R053 Chronic cough: Secondary | ICD-10-CM

## 2012-04-01 DIAGNOSIS — D869 Sarcoidosis, unspecified: Secondary | ICD-10-CM

## 2012-04-01 DIAGNOSIS — I10 Essential (primary) hypertension: Secondary | ICD-10-CM

## 2012-04-01 DIAGNOSIS — R05 Cough: Secondary | ICD-10-CM

## 2012-04-01 NOTE — Progress Notes (Signed)
Subjective:    Patient ID: Julia [name], female    DOB: Nov 14, 1950, 61 y.o.   MRN: [id number]  HPI persistent cough Has been on cough medications "bromfed: which helps Hx of sarcoid Trial of inhaler in AM "pulmocort"  seemed to help at first but not now Still wheezing post cough Notes post nasal drainage   Review of Systems  Constitutional: Negative for activity change, appetite change and fatigue.  HENT: Positive for nosebleeds, congestion and rhinorrhea. Negative for ear pain, neck pain, postnasal drip and sinus pressure.   Eyes: Negative for redness and visual disturbance.  Respiratory: Negative for cough, shortness of breath and wheezing.   Gastrointestinal: Negative for abdominal pain and abdominal distention.  Genitourinary: Negative for dysuria, frequency and menstrual problem.  Musculoskeletal: Negative for myalgias, joint swelling and arthralgias.  Skin: Negative for rash and wound.  Neurological: Positive for weakness. Negative for dizziness and headaches.  Hematological: Negative for adenopathy. Does not bruise/bleed easily.  Psychiatric/Behavioral: Negative for disturbed wake/sleep cycle and decreased concentration.   Past Medical History  Diagnosis Date  . Anal fissure 04/29/2008  . BACK PAIN, LUMBAR, WITH RADICULOPATHY 09/21/2008  . BREAST CYSTS, BILATERAL 10/01/2007  . Carpal tunnel syndrome   . CEREBRAL ANEURYSM   . CHEST DISCOMFORT   . CHOLELITHIASIS   . COLITIS   . COMMON MIGRAINE   . CONSTIPATION   . Cough variant asthma   . CROHN'S DISEASE   . DEGENERATIVE DISC DISEASE, CERVICAL SPINE   . Diarrhea   . DIZZINESS, CHRONIC   . FACIAL PARESTHESIA, LEFT   . GERD   . HIATAL HERNIA   . HYPERLIPIDEMIA, BORDERLINE   . HYPERTENSION   . ORGANIC INSOMNIA UNSPECIFIED   . Sarcoidosis   . SLEEP APNEA   . UNSPECIFIED VENOUS INSUFFICIENCY   . CVA (cerebrovascular accident due to intracerebral hemorrhage)     History   Social History  . Marital Status:  Married    Spouse Name: N/A    Number of Children: 2  . Years of Education: N/A   Occupational History  . retired    Social History Main Topics  . Smoking status: Former Smoker    Quit date: 09/12/1979  . Smokeless tobacco: Not on file   Comment: has been quit for 35 years  . Alcohol Use: 4.2 oz/week    7 Glasses of wine per week  . Drug Use: No  . Sexually Active: Not on file   Other Topics Concern  . Not on file   Social History Narrative  . No narrative on file    Past Surgical History  Procedure Date  . Breast lumpectomy   . Colectomy 1981    right  . Carpal tunnel release   . Abdominal hysterectomy   . Tonsillectomy     Family History  Problem Relation Age of Onset  . Stomach cancer Father   . Esophageal cancer Father   . Colon cancer Neg Hx     Allergies  Allergen Reactions  . Meperidine Hcl     REACTION: vomiting  . Penicillins     REACTION: rash    Current Outpatient Prescriptions on File Prior to Visit  Medication Sig Dispense Refill  . amLODipine (NORVASC) 2.5 MG tablet Take 1 tablet (2.5 mg total) by mouth daily.  90 tablet  3  . aspirin 81 MG tablet Take 1 tablet (81 mg total) by mouth daily. Monday Wednesday and Friday      . BROMFED DM 30-2-10  MG/5ML syrup TAKE 2 TEASPOONSFUL EVERY 6 HOURS AS NEEDED  240 mL  0  . budesonide (PULMICORT FLEXHALER) 180 MCG/ACT inhaler Inhale 1 puff into the lungs daily.      . butalbital-acetaminophen-caffeine (FIORICET, ESGIC) 50-325-40 MG per tablet TAKE 1 TABLET BY MOUTH TWICE A DAY AS NEEDED FOR HEADACHE  60 tablet  1  . cloNIDine HCl (KAPVAY) 0.1 MG TB12 ER tablet 1/2 tab as needed for symptomatic bp >170  30 tablet  11  . DULoxetine (CYMBALTA) 30 MG capsule Take 1 capsule (30 mg total) by mouth daily.  30 capsule  11  . etodolac (LODINE) 400 MG tablet TAKE 1 TABLET TWICE A DAY AS NEEDED  60 tablet  0  . fexofenadine (ALLEGRA) 180 MG tablet Take 1 tablet (180 mg total) by mouth daily.  30 tablet  11  .  Flaxseed, Linseed, (FLAXSEED OIL) 1000 MG CAPS Take by mouth daily.        . mesalamine (PENTASA) 250 MG CR capsule Take 2 capsules by mouth at bedtime.  60 capsule  3  . omeprazole (PRILOSEC) 40 MG capsule Take 1 capsule (40 mg total) by mouth daily.  90 capsule  3  . propranolol (INDERAL LA) 60 MG 24 hr capsule Take 1 capsule (60 mg total) by mouth 2 (two) times daily.  60 capsule  11  . propranolol ER (INDERAL LA) 60 MG 24 hr capsule Take 1 capsule (60 mg total) by mouth 2 (two) times daily.  180 capsule  3  . rosuvastatin (CRESTOR) 20 MG tablet Take 1 tablet (20 mg total) by mouth once a week.  5 tablet  0  . triazolam (HALCION) 0.25 MG tablet Take 1 tablet (0.25 mg total) by mouth at bedtime as needed.  30 tablet  5  . DISCONTD: DULoxetine (CYMBALTA) 30 MG capsule Take 1 capsule (30 mg total) by mouth daily.  30 capsule  0  . DISCONTD: propranolol (INDERAL LA) 60 MG 24 hr capsule Take 1 capsule (60 mg total) by mouth daily.  180 capsule  3    BP 136/80  Pulse 72  Temp 98.3 F (36.8 C)  Resp 16  Ht 5' 1.5" (1.562 m)  Wt 156 lb (70.761 kg)  BMI 29.00 kg/m2        Objective:   Physical Exam  Nursing note and vitals reviewed. Constitutional: She is oriented to person, place, and time. She appears well-developed and well-nourished. No distress.  HENT:  Head: Normocephalic and atraumatic.  Right Ear: External ear normal.  Left Ear: External ear normal.  Nose: Nose normal.  Mouth/Throat: Oropharynx is clear and moist.  Eyes: Conjunctivae and EOM are normal. Pupils are equal, round, and reactive to light.  Neck: Normal range of motion. Neck supple. No JVD present. No tracheal deviation present. No thyromegaly present.  Cardiovascular: Normal rate, regular rhythm, normal heart sounds and intact distal pulses.   No murmur heard. Pulmonary/Chest: Effort normal and breath sounds normal. She has no wheezes. She exhibits no tenderness.  Abdominal: Soft. Bowel sounds are normal.    Musculoskeletal: Normal range of motion. She exhibits no edema and no tenderness.  Lymphadenopathy:    She has no cervical adenopathy.  Neurological: She is alert and oriented to person, place, and time. She has normal reflexes. No cranial nerve deficit.  Skin: Skin is warm and dry. She is not diaphoretic.  Psychiatric: She has a normal mood and affect. Her behavior is normal.  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  Otherwise stable on current medications blood pressure appears to be stable Recent desires to titrate off Cymbalta would suggest she goes every other day for about 10 days then stop

## 2012-04-01 NOTE — Patient Instructions (Signed)
Take the Cymbalta every other day for 10 days then stop

## 2012-04-09 ENCOUNTER — Other Ambulatory Visit: Payer: Self-pay | Admitting: *Deleted

## 2012-04-09 ENCOUNTER — Other Ambulatory Visit: Payer: Self-pay | Admitting: Internal Medicine

## 2012-04-09 DIAGNOSIS — I10 Essential (primary) hypertension: Secondary | ICD-10-CM

## 2012-04-09 MED ORDER — CLONIDINE HCL ER 0.1 MG PO TB12: ORAL_TABLET | ORAL | Status: DC

## 2012-04-24 ENCOUNTER — Other Ambulatory Visit: Payer: Self-pay | Admitting: Internal Medicine

## 2012-04-29 ENCOUNTER — Other Ambulatory Visit: Payer: Self-pay | Admitting: Internal Medicine

## 2012-05-08 ENCOUNTER — Telehealth: Payer: Self-pay | Admitting: Internal Medicine

## 2012-05-08 MED ORDER — BECLOMETHASONE DIPROPIONATE 80 MCG/ACT NA AERS: 2.0000 | INHALATION_SPRAY | Freq: Every day | NASAL | Status: DC

## 2012-05-08 NOTE — Telephone Encounter (Signed)
Patient calling, needs a script for sample of nasal spray given last month during her office visit, [place].  She still has the same symptoms that she was seen for at that time.  They are in [place], [place].     She has attempted to wean herself from the Cymbalta but she has resumed taking.    Uses [facility] in [place], [phone].

## 2012-05-08 NOTE — Telephone Encounter (Signed)
done

## 2012-05-17 ENCOUNTER — Other Ambulatory Visit: Payer: Self-pay | Admitting: Internal Medicine

## 2012-06-05 ENCOUNTER — Encounter: Payer: Self-pay | Admitting: Gastroenterology

## 2012-06-10 ENCOUNTER — Telehealth: Payer: Self-pay | Admitting: Internal Medicine

## 2012-06-10 ENCOUNTER — Other Ambulatory Visit: Payer: Self-pay | Admitting: Internal Medicine

## 2012-06-10 NOTE — Telephone Encounter (Signed)
06/04/2012  Patient started with a sore throat 06/07/2012 Patient with runny nose , congestion.  Sore throat now resolved.  06/10/2012  Patient continues with nasal congestion and now with a  cough.  Afebrile.  All emergent sxs ruled out per Cough - Adult Protocol with exception to 'Dry cough following a cold'.  Home Care Advice given.

## 2012-07-30 ENCOUNTER — Other Ambulatory Visit: Payer: Self-pay | Admitting: Internal Medicine

## 2012-08-05 ENCOUNTER — Other Ambulatory Visit: Payer: Medicare Other

## 2012-08-19 ENCOUNTER — Other Ambulatory Visit: Payer: Self-pay | Admitting: Internal Medicine

## 2012-08-30 ENCOUNTER — Encounter: Payer: Medicare Other | Admitting: Internal Medicine

## 2012-09-02 ENCOUNTER — Other Ambulatory Visit (INDEPENDENT_AMBULATORY_CARE_PROVIDER_SITE_OTHER): Payer: Medicare Other

## 2012-09-02 DIAGNOSIS — I10 Essential (primary) hypertension: Secondary | ICD-10-CM

## 2012-09-02 DIAGNOSIS — Z0000 Encounter for general adult medical examination without abnormal findings: Secondary | ICD-10-CM

## 2012-09-02 DIAGNOSIS — E785 Hyperlipidemia, unspecified: Secondary | ICD-10-CM

## 2012-09-02 LAB — POCT URINALYSIS DIPSTICK
Bilirubin, UA: NEGATIVE
Blood, UA: NEGATIVE
Glucose, UA: NEGATIVE
Ketones, UA: NEGATIVE
Nitrite, UA: NEGATIVE
Spec Grav, UA: 1.015
pH, UA: 7

## 2012-09-02 LAB — BASIC METABOLIC PANEL
BUN: 17 mg/dL (ref 6–23)
Calcium: 9.1 mg/dL (ref 8.4–10.5)
GFR: 74.75 mL/min (ref >60.00)
Potassium: 4.1 mEq/L (ref 3.5–5.1)
Sodium: 139 mEq/L (ref 135–145)

## 2012-09-02 LAB — TSH: TSH: 1 u[IU]/mL (ref 0.35–5.50)

## 2012-09-02 LAB — CBC WITH DIFFERENTIAL/PLATELET
Basophils Absolute: 0 10*3/uL (ref 0.0–0.1)
Eosinophils Relative: 2.1 % (ref 0.0–5.0)
HCT: 41.9 % (ref 36.0–46.0)
Hemoglobin: 14 g/dL (ref 12.0–15.0)
Lymphocytes Relative: 36.3 % (ref 12.0–46.0)
Lymphs Abs: 1.5 10*3/uL (ref 0.7–4.0)
Monocytes Relative: 11.3 % (ref 3.0–12.0)
Platelets: 190 10*3/uL (ref 150.0–400.0)
WBC: 4.2 10*3/uL — ABNORMAL LOW (ref 4.5–10.5)

## 2012-09-02 LAB — HEPATIC FUNCTION PANEL
ALT: 14 U/L (ref 0–35)
AST: 18 U/L (ref 0–37)
Alkaline Phosphatase: 59 U/L (ref 39–117)
Total Bilirubin: 0.4 mg/dL (ref 0.3–1.2)

## 2012-09-02 LAB — LIPID PANEL
HDL: 59.5 mg/dL (ref >39.00)
Total CHOL/HDL Ratio: 4
Triglycerides: 164 mg/dL — ABNORMAL HIGH (ref 0.0–149.0)
VLDL: 32.8 mg/dL (ref 0.0–40.0)

## 2012-09-02 LAB — LDL CHOLESTEROL, DIRECT: Direct LDL: 133.9 mg/dL

## 2012-09-19 ENCOUNTER — Encounter: Payer: Medicare Other | Admitting: Internal Medicine

## 2012-09-27 ENCOUNTER — Encounter: Payer: Self-pay | Admitting: Internal Medicine

## 2012-09-27 ENCOUNTER — Ambulatory Visit (INDEPENDENT_AMBULATORY_CARE_PROVIDER_SITE_OTHER): Payer: Medicare Other | Admitting: Internal Medicine

## 2012-09-27 VITALS — BP 130/80 | HR 72 | Temp 98.2°F | Resp 16 | Ht 61.5 in | Wt 156.0 lb

## 2012-09-27 DIAGNOSIS — Z0000 Encounter for general adult medical examination without abnormal findings: Secondary | ICD-10-CM

## 2012-09-27 DIAGNOSIS — E785 Hyperlipidemia, unspecified: Secondary | ICD-10-CM

## 2012-09-27 MED ORDER — CITALOPRAM HYDROBROMIDE 20 MG PO TABS: 20.0000 mg | ORAL_TABLET | Freq: Every day | ORAL | Status: DC

## 2012-09-27 NOTE — Progress Notes (Signed)
Subjective:    Patient ID: Julia [name], female    DOB: [birth year], 62 y.o.   MRN: [id number]  HPI Patient presents for an annual physical examination.  His history of hypertension mild asthmatic bronchitis a history of depression on Cymbalta and a history of GERD.  She is also on Crestor once weekly for hyperlipidemia.   CPX   Review of Systems  Constitutional: Positive for activity change and fatigue. Negative for appetite change.  HENT: Positive for congestion. Negative for ear pain, neck pain, postnasal drip and sinus pressure.   Eyes: Negative for redness and visual disturbance.  Respiratory: Positive for shortness of breath. Negative for cough and wheezing.   Gastrointestinal: Negative for abdominal pain and abdominal distention.  Genitourinary: Negative for dysuria, frequency and menstrual problem.  Musculoskeletal: Negative for myalgias, joint swelling and arthralgias.  Skin: Negative for rash and wound.  Neurological: Positive for dizziness, weakness and numbness. Negative for headaches.  Hematological: Negative for adenopathy. Does not bruise/bleed easily.  Psychiatric/Behavioral: Negative for sleep disturbance and decreased concentration.   Past Medical History  Diagnosis Date  . Anal fissure 04/29/2008  . BACK PAIN, LUMBAR, WITH RADICULOPATHY 09/21/2008  . BREAST CYSTS, BILATERAL 10/01/2007  . Carpal tunnel syndrome   . CEREBRAL ANEURYSM   . CHEST DISCOMFORT   . CHOLELITHIASIS   . COLITIS   . COMMON MIGRAINE   . CONSTIPATION   . Cough variant asthma   . CROHN'S DISEASE   . DEGENERATIVE DISC DISEASE, CERVICAL SPINE   . Diarrhea   . DIZZINESS, CHRONIC   . FACIAL PARESTHESIA, LEFT   . GERD   . HIATAL HERNIA   . HYPERLIPIDEMIA, BORDERLINE   . HYPERTENSION   . ORGANIC INSOMNIA UNSPECIFIED   . Sarcoidosis   . SLEEP APNEA   . UNSPECIFIED VENOUS INSUFFICIENCY   . CVA (cerebrovascular accident due to intracerebral hemorrhage)     History   Social  History  . Marital Status: Married    Spouse Name: N/A    Number of Children: 2  . Years of Education: N/A   Occupational History  . retired    Social History Main Topics  . Smoking status: Former Smoker    Quit date: 09/12/1979  . Smokeless tobacco: Not on file     Comment: has been quit for 35 years  . Alcohol Use: 4.2 oz/week    7 Glasses of wine per week  . Drug Use: No  . Sexually Active: Not on file   Other Topics Concern  . Not on file   Social History Narrative  . No narrative on file    Past Surgical History  Procedure Date  . Breast lumpectomy   . Colectomy 1981    right  . Carpal tunnel release   . Abdominal hysterectomy   . Tonsillectomy     Family History  Problem Relation Age of Onset  . Stomach cancer Father   . Esophageal cancer Father   . Colon cancer Neg Hx     Allergies  Allergen Reactions  . Meperidine Hcl     REACTION: vomiting  . Penicillins     REACTION: rash    Current Outpatient Prescriptions on File Prior to Visit  Medication Sig Dispense Refill  . amLODipine (NORVASC) 2.5 MG tablet Take 1 tablet (2.5 mg total) by mouth daily.  90 tablet  3  . aspirin 81 MG tablet Take 1 tablet (81 mg total) by mouth daily. Monday Wednesday and Friday      .  Beclomethasone Dipropionate 80 MCG/ACT AERS Place 2 sprays into the nose daily.  1 Inhaler  6  . BROMFED DM 30-2-10 MG/5ML syrup TAKE 2 TEASPOONSFUL EVERY 6 HOURS AS NEEDED  240 mL  0  . BROMFED DM 30-2-10 MG/5ML syrup TAKE 2 TEASPOONSFUL EVERY 6 HOURS AS NEEDED  240 mL  0  . budesonide (PULMICORT FLEXHALER) 180 MCG/ACT inhaler Inhale 1 puff into the lungs daily.      . butalbital-acetaminophen-caffeine (FIORICET, ESGIC) 50-325-40 MG per tablet TAKE 1 TABLET BY MOUTH TWICE A DAY AS NEEDED FOR HEADACHE  60 tablet  1  . cloNIDine HCl (KAPVAY) 0.1 MG TB12 ER tablet 1/2 tab as needed for symptomatic bp >170  15 tablet  5  . DULoxetine (CYMBALTA) 30 MG capsule Take 1 capsule (30 mg total) by mouth  daily.  30 capsule  11  . etodolac (LODINE) 400 MG tablet TAKE 1 TABLET TWICE A DAY AS NEEDED  60 tablet  0  . fexofenadine (ALLEGRA) 180 MG tablet Take 1 tablet (180 mg total) by mouth daily.  30 tablet  11  . Flaxseed, Linseed, (FLAXSEED OIL) 1000 MG CAPS Take by mouth daily.        . mesalamine (PENTASA) 250 MG CR capsule Take 2 capsules by mouth at bedtime.  60 capsule  3  . omeprazole (PRILOSEC) 40 MG capsule Take 1 capsule (40 mg total) by mouth daily.  90 capsule  3  . propranolol (INDERAL LA) 60 MG 24 hr capsule Take 1 capsule (60 mg total) by mouth 2 (two) times daily.  60 capsule  11  . propranolol ER (INDERAL LA) 60 MG 24 hr capsule Take 1 capsule (60 mg total) by mouth 2 (two) times daily.  180 capsule  3  . rosuvastatin (CRESTOR) 20 MG tablet Take 1 tablet (20 mg total) by mouth once a week.  5 tablet  0  . triazolam (HALCION) 0.25 MG tablet TAKE 1 TABLET BY MOUTH AT BEDTIME AS NEEDED  30 tablet  5    There were no vitals taken for this visit.       Objective:   Physical Exam  Nursing note and vitals reviewed. Constitutional: She is oriented to person, place, and time. She appears well-developed and well-nourished. No distress.  HENT:  Head: Normocephalic and atraumatic.  Right Ear: External ear normal.  Left Ear: External ear normal.  Nose: Nose normal.  Mouth/Throat: Oropharynx is clear and moist.  Eyes: Conjunctivae normal and EOM are normal. Pupils are equal, round, and reactive to light.  Neck: Normal range of motion. Neck supple. No JVD present. No tracheal deviation present. No thyromegaly present.  Cardiovascular: Normal rate, regular rhythm and intact distal pulses.   Murmur heard. Pulmonary/Chest: Effort normal and breath sounds normal. She has no wheezes. She exhibits no tenderness.  Abdominal: Soft. Bowel sounds are normal.  Musculoskeletal: Normal range of motion. She exhibits no edema and no tenderness.  Lymphadenopathy:    She has no cervical adenopathy.   Neurological: She is alert and oriented to person, place, and time. She has normal reflexes. No cranial nerve deficit.  Skin: Skin is warm and dry. She is not diaphoretic.          Assessment & Plan:   This is a routine physical examination for this healthy  Female. Reviewed all health maintenance protocols including mammography colonoscopy bone density and reviewed appropriate screening labs. Her immunization history was reviewed as well as her current medications and allergies refills  of her chronic medications were given and the plan for yearly health maintenance was discussed all orders and referrals were made as appropriate. Patient will increase her Crestor to Monday and Friday.  She has stable hypertension we will change her Cymbalta to 2 costs 2 Celexa 40 mg 1 by mouth daily

## 2012-09-27 NOTE — Patient Instructions (Signed)
The patient is instructed to continue all medications as prescribed. Schedule followup with check out clerk upon leaving the clinic  

## 2012-10-25 ENCOUNTER — Other Ambulatory Visit: Payer: Self-pay | Admitting: Internal Medicine

## 2012-10-26 ENCOUNTER — Other Ambulatory Visit: Payer: Self-pay

## 2012-10-31 ENCOUNTER — Other Ambulatory Visit: Payer: Self-pay | Admitting: Internal Medicine

## 2012-11-12 ENCOUNTER — Other Ambulatory Visit: Payer: Self-pay | Admitting: Internal Medicine

## 2012-12-05 ENCOUNTER — Other Ambulatory Visit: Payer: Self-pay | Admitting: Internal Medicine

## 2013-01-10 ENCOUNTER — Ambulatory Visit (INDEPENDENT_AMBULATORY_CARE_PROVIDER_SITE_OTHER): Payer: Medicare Other | Admitting: Internal Medicine

## 2013-01-10 ENCOUNTER — Other Ambulatory Visit: Payer: Self-pay | Admitting: *Deleted

## 2013-01-10 ENCOUNTER — Encounter: Payer: Self-pay | Admitting: Internal Medicine

## 2013-01-10 VITALS — BP 130/80 | HR 72 | Temp 98.6°F | Resp 16 | Ht 61.5 in | Wt 160.0 lb

## 2013-01-10 DIAGNOSIS — T887XXA Unspecified adverse effect of drug or medicament, initial encounter: Secondary | ICD-10-CM

## 2013-01-10 DIAGNOSIS — A499 Bacterial infection, unspecified: Secondary | ICD-10-CM

## 2013-01-10 DIAGNOSIS — J45901 Unspecified asthma with (acute) exacerbation: Secondary | ICD-10-CM

## 2013-01-10 DIAGNOSIS — J329 Chronic sinusitis, unspecified: Secondary | ICD-10-CM

## 2013-01-10 MED ORDER — FLUCONAZOLE 100 MG PO TABS: 100.0000 mg | ORAL_TABLET | Freq: Every day | ORAL | Status: DC

## 2013-01-10 NOTE — Progress Notes (Signed)
Subjective:    Patient ID: Julia [name], female    DOB: Jun 02, 1951, 62 y.o.   MRN: [id number]  HPI URI symptoms began 1 weeks agon with sore throat and cough. No fever. Moderate SOB, chest wall pain Severe cough GERD Back pain    Review of Systems  Constitutional: Negative for chills, activity change, appetite change and fatigue.  HENT: Positive for congestion and rhinorrhea. Negative for ear pain, neck pain, postnasal drip and sinus pressure.   Eyes: Negative for redness and visual disturbance.  Respiratory: Positive for cough and shortness of breath. Negative for wheezing.   Gastrointestinal: Negative for abdominal pain and abdominal distention.  Genitourinary: Negative for dysuria, frequency and menstrual problem.  Musculoskeletal: Negative for myalgias, joint swelling and arthralgias.  Skin: Negative for rash and wound.  Neurological: Negative for dizziness, weakness and headaches.  Hematological: Negative for adenopathy. Does not bruise/bleed easily.  Psychiatric/Behavioral: Negative for sleep disturbance and decreased concentration.   Past Medical History  Diagnosis Date  . Anal fissure 04/29/2008  . BACK PAIN, LUMBAR, WITH RADICULOPATHY 09/21/2008  . BREAST CYSTS, BILATERAL 10/01/2007  . Carpal tunnel syndrome   . CEREBRAL ANEURYSM   . CHEST DISCOMFORT   . CHOLELITHIASIS   . COLITIS   . COMMON MIGRAINE   . CONSTIPATION   . Cough variant asthma   . CROHN'S DISEASE   . DEGENERATIVE DISC DISEASE, CERVICAL SPINE   . Diarrhea   . DIZZINESS, CHRONIC   . FACIAL PARESTHESIA, LEFT   . GERD   . HIATAL HERNIA   . HYPERLIPIDEMIA, BORDERLINE   . HYPERTENSION   . ORGANIC INSOMNIA UNSPECIFIED   . Sarcoidosis   . SLEEP APNEA   . UNSPECIFIED VENOUS INSUFFICIENCY   . CVA (cerebrovascular accident due to intracerebral hemorrhage)     History   Social History  . Marital Status: Married    Spouse Name: N/A    Number of Children: 2  . Years of Education: N/A    Occupational History  . retired    Social History Main Topics  . Smoking status: Former Smoker    Quit date: 09/12/1979  . Smokeless tobacco: Not on file     Comment: has been quit for 35 years  . Alcohol Use: 4.2 oz/week    7 Glasses of wine per week  . Drug Use: No  . Sexually Active: Not on file   Other Topics Concern  . Not on file   Social History Narrative  . No narrative on file    Past Surgical History  Procedure Laterality Date  . Breast lumpectomy    . Colectomy  1981    right  . Carpal tunnel release    . Abdominal hysterectomy    . Tonsillectomy      Family History  Problem Relation Age of Onset  . Stomach cancer Father   . Esophageal cancer Father   . Colon cancer Neg Hx     Allergies  Allergen Reactions  . Meperidine Hcl     REACTION: vomiting  . Penicillins     REACTION: rash    Current Outpatient Prescriptions on File Prior to Visit  Medication Sig Dispense Refill  . amLODipine (NORVASC) 2.5 MG tablet Take 1 tablet (2.5 mg total) by mouth daily.  90 tablet  3  . aspirin 81 MG tablet Take 1 tablet (81 mg total) by mouth daily. Monday Wednesday and Friday      . BROMFED DM 30-2-10 MG/5ML syrup TAKE 2 TEASPOONSFUL  EVERY 6 HOURS AS NEEDED  240 mL  0  . budesonide (PULMICORT FLEXHALER) 180 MCG/ACT inhaler Inhale 1 puff into the lungs daily.      . butalbital-acetaminophen-caffeine (FIORICET, ESGIC) 50-325-40 MG per tablet TAKE ONE TABLET TWICE A DAY AS NEEDED FOR HEADACHE  60 tablet  3  . citalopram (CELEXA) 20 MG tablet Take 1 tablet (20 mg total) by mouth daily.  30 tablet  11  . cloNIDine HCl (KAPVAY) 0.1 MG TB12 ER tablet 1/2 tab as needed for symptomatic bp >170  15 tablet  5  . etodolac (LODINE) 400 MG tablet TAKE 1 TABLET TWICE A DAY AS NEEDED  60 tablet  0  . fexofenadine (ALLEGRA) 180 MG tablet Take 1 tablet (180 mg total) by mouth daily.  30 tablet  11  . Flaxseed, Linseed, (FLAXSEED OIL) 1000 MG CAPS Take by mouth daily.        .  fluticasone (FLONASE) 50 MCG/ACT nasal spray Place 2 sprays into the nose daily.      Marland Kitchen omeprazole (PRILOSEC) 40 MG capsule TAKE ONE CAPSULE EVERY DAY  90 capsule  3  . PENTASA 250 MG CR capsule TAKE 2 CAPSULES BY MOUTH AT BEDTIME.  60 capsule  1  . propranolol (INDERAL LA) 60 MG 24 hr capsule Take 60 mg by mouth 2 (two) times daily.      . propranolol ER (INDERAL LA) 60 MG 24 hr capsule Take 1 capsule (60 mg total) by mouth 2 (two) times daily.  180 capsule  3  . rosuvastatin (CRESTOR) 20 MG tablet Take 20 mg by mouth once a week.      . triazolam (HALCION) 0.25 MG tablet TAKE 1 TABLET BY MOUTH AT BEDTIME AS NEEDED  30 tablet  5   No current facility-administered medications on file prior to visit.    BP 130/80  Pulse 72  Temp(Src) 98.6 F (37 C)  Resp 16  Ht 5' 1.5" (1.562 m)  Wt 160 lb (72.576 kg)  BMI 29.75 kg/m2       Objective:   Physical Exam  Nursing note and vitals reviewed. HENT:  Turbinates are swollen there is crusting visible in the nasal passages and there is purulent drainage from the maxillary sinus more prominent on the left  Eyes: Conjunctivae are normal. Pupils are equal, round, and reactive to light.  Cardiovascular: Normal rate, regular rhythm and intact distal pulses.   Pulmonary/Chest: She has no wheezes. She has no rales.          Assessment & Plan:  URI and cough secondary to sinus bacterial infection Will rx  Stable asthma without use of rescue inhalers ( cough is due to drainage) avelox 400 for 7 days

## 2013-01-10 NOTE — Patient Instructions (Signed)
mucinex fast max cough and cold

## 2013-01-13 ENCOUNTER — Other Ambulatory Visit: Payer: Self-pay | Admitting: Internal Medicine

## 2013-01-20 ENCOUNTER — Other Ambulatory Visit: Payer: Self-pay | Admitting: *Deleted

## 2013-01-20 MED ORDER — PSEUDOEPH-BROMPHEN-DM 30-2-10 MG/5ML PO SYRP: ORAL_SOLUTION | ORAL | Status: DC

## 2013-02-08 ENCOUNTER — Other Ambulatory Visit: Payer: Self-pay | Admitting: Internal Medicine

## 2013-02-11 ENCOUNTER — Telehealth: Payer: Self-pay | Admitting: *Deleted

## 2013-02-11 MED ORDER — LORAZEPAM 0.5 MG PO TABS: 0.5000 mg | ORAL_TABLET | Freq: Three times a day (TID) | ORAL | Status: DC | PRN

## 2013-02-11 NOTE — Telephone Encounter (Signed)
Pt call stating husband is having heart surgery this week and she is upset. Needs meds for anxiety and to keep bp down-she has hx of stroke--per dr Arnoldo Morale, may have ativan .5 tid prn #30 called to [facility] in [place] and pt informed

## 2013-02-14 ENCOUNTER — Other Ambulatory Visit: Payer: Self-pay | Admitting: Internal Medicine

## 2013-03-02 ENCOUNTER — Other Ambulatory Visit: Payer: Self-pay | Admitting: Internal Medicine

## 2013-03-20 ENCOUNTER — Other Ambulatory Visit: Payer: Self-pay | Admitting: Internal Medicine

## 2013-05-13 ENCOUNTER — Other Ambulatory Visit: Payer: Self-pay | Admitting: Internal Medicine

## 2013-05-30 ENCOUNTER — Other Ambulatory Visit: Payer: Self-pay

## 2013-05-30 DIAGNOSIS — Z1231 Encounter for screening mammogram for malignant neoplasm of breast: Secondary | ICD-10-CM

## 2013-06-04 ENCOUNTER — Telehealth: Payer: Self-pay | Admitting: *Deleted

## 2013-06-04 ENCOUNTER — Other Ambulatory Visit: Payer: Self-pay | Admitting: Internal Medicine

## 2013-06-04 NOTE — Telephone Encounter (Signed)
done

## 2013-06-30 ENCOUNTER — Encounter: Payer: Self-pay | Admitting: Internal Medicine

## 2013-06-30 ENCOUNTER — Ambulatory Visit (INDEPENDENT_AMBULATORY_CARE_PROVIDER_SITE_OTHER)
Admission: RE | Admit: 2013-06-30 | Discharge: 2013-06-30 | Disposition: A | Discharge status: Home / Self Care | Payer: Medicare Other | Source: Ambulatory Visit | Attending: Internal Medicine | Admitting: Internal Medicine

## 2013-06-30 ENCOUNTER — Ambulatory Visit (INDEPENDENT_AMBULATORY_CARE_PROVIDER_SITE_OTHER): Payer: Medicare Other | Admitting: Internal Medicine

## 2013-06-30 VITALS — BP 130/80 | HR 76 | Temp 98.2°F | Resp 16 | Ht 61.5 in | Wt 162.0 lb

## 2013-06-30 DIAGNOSIS — J45991 Cough variant asthma: Secondary | ICD-10-CM

## 2013-06-30 DIAGNOSIS — F341 Dysthymic disorder: Secondary | ICD-10-CM

## 2013-06-30 DIAGNOSIS — R42 Dizziness and giddiness: Secondary | ICD-10-CM

## 2013-06-30 DIAGNOSIS — Z23 Encounter for immunization: Secondary | ICD-10-CM

## 2013-06-30 DIAGNOSIS — F329 Major depressive disorder, single episode, unspecified: Secondary | ICD-10-CM

## 2013-06-30 DIAGNOSIS — K219 Gastro-esophageal reflux disease without esophagitis: Secondary | ICD-10-CM

## 2013-06-30 DIAGNOSIS — I10 Essential (primary) hypertension: Secondary | ICD-10-CM

## 2013-06-30 MED ORDER — RANITIDINE HCL 300 MG PO TABS: 300.0000 mg | ORAL_TABLET | Freq: Two times a day (BID) | ORAL | Status: DC

## 2013-06-30 NOTE — Progress Notes (Signed)
Subjective:    Patient ID: Julia [name], female    DOB: 08/14/1951, 62 y.o.   MRN: [id number]  HPI Has noted increased late afternoon cough and hoarse voice Dizzy when blood pressure is lower No chest pain No CVA symptoms GI stable    Review of Systems  Constitutional: Negative for activity change, appetite change and fatigue.  HENT: Negative for congestion, ear pain, postnasal drip and sinus pressure.   Eyes: Negative for redness and visual disturbance.  Respiratory: Negative for cough, shortness of breath and wheezing.   Gastrointestinal: Negative for abdominal pain and abdominal distention.  Genitourinary: Negative for dysuria, frequency and menstrual problem.  Musculoskeletal: Negative for arthralgias, joint swelling, myalgias and neck pain.  Skin: Negative for rash and wound.  Neurological: Negative for dizziness, weakness and headaches.  Hematological: Negative for adenopathy. Does not bruise/bleed easily.  Psychiatric/Behavioral: Negative for sleep disturbance and decreased concentration.       Past Medical History  Diagnosis Date  . Anal fissure 04/29/2008  . BACK PAIN, LUMBAR, WITH RADICULOPATHY 09/21/2008  . BREAST CYSTS, BILATERAL 10/01/2007  . Carpal tunnel syndrome   . CEREBRAL ANEURYSM   . CHEST DISCOMFORT   . CHOLELITHIASIS   . COLITIS   . COMMON MIGRAINE   . CONSTIPATION   . Cough variant asthma   . CROHN'S DISEASE   . DEGENERATIVE DISC DISEASE, CERVICAL SPINE   . Diarrhea   . DIZZINESS, CHRONIC   . FACIAL PARESTHESIA, LEFT   . GERD   . HIATAL HERNIA   . HYPERLIPIDEMIA, BORDERLINE   . HYPERTENSION   . ORGANIC INSOMNIA UNSPECIFIED   . Sarcoidosis   . SLEEP APNEA   . UNSPECIFIED VENOUS INSUFFICIENCY   . CVA (cerebrovascular accident due to intracerebral hemorrhage)     History   Social History  . Marital Status: Married    Spouse Name: N/A    Number of Children: 2  . Years of Education: N/A   Occupational History  . retired     Social History Main Topics  . Smoking status: Former Smoker    Quit date: 09/12/1979  . Smokeless tobacco: Not on file     Comment: has been quit for 35 years  . Alcohol Use: 4.2 oz/week    7 Glasses of wine per week  . Drug Use: No  . Sexual Activity: Not on file   Other Topics Concern  . Not on file   Social History Narrative  . No narrative on file    Past Surgical History  Procedure Laterality Date  . Breast lumpectomy    . Colectomy  1981    right  . Carpal tunnel release    . Abdominal hysterectomy    . Tonsillectomy      Family History  Problem Relation Age of Onset  . Stomach cancer Father   . Esophageal cancer Father   . Colon cancer Neg Hx     Allergies  Allergen Reactions  . Meperidine Hcl     REACTION: vomiting  . Penicillins     REACTION: rash    Current Outpatient Prescriptions on File Prior to Visit  Medication Sig Dispense Refill  . amLODipine (NORVASC) 2.5 MG tablet TAKE 1 TABLET EVERY DAY  90 tablet  3  . aspirin 81 MG tablet Take 1 tablet (81 mg total) by mouth daily. Monday Wednesday and Friday      . BROMFED DM 30-2-10 MG/5ML syrup TAKE 2 TEASPOONSFUL EVERY 6 HOURS AS NEEDED  240  mL  0  . budesonide (PULMICORT FLEXHALER) 180 MCG/ACT inhaler Inhale 1 puff into the lungs daily.      . butalbital-acetaminophen-caffeine (FIORICET, ESGIC) 50-325-40 MG per tablet TAKE ONE TABLET TWICE A DAY AS NEEDED FOR HEADACHE  60 tablet  3  . citalopram (CELEXA) 20 MG tablet Take 1 tablet (20 mg total) by mouth daily.  30 tablet  11  . cloNIDine HCl (KAPVAY) 0.1 MG TB12 ER tablet 1/2 tab as needed for symptomatic bp >170  15 tablet  5  . conjugated estrogens (PREMARIN) vaginal cream Place 0.5 g vaginally daily. Use 3 times a week      . etodolac (LODINE) 400 MG tablet TAKE 1 TABLET BY MOUTH TWICE DAILY AS NEEDED.  60 tablet  6  . fexofenadine (ALLEGRA) 180 MG tablet Take 1 tablet (180 mg total) by mouth daily.  30 tablet  11  . Flaxseed, Linseed, (FLAXSEED  OIL) 1000 MG CAPS Take by mouth daily.        . fluticasone (FLONASE) 50 MCG/ACT nasal spray Place 2 sprays into the nose daily.      Marland Kitchen omeprazole (PRILOSEC) 40 MG capsule TAKE ONE CAPSULE EVERY DAY  90 capsule  3  . PENTASA 250 MG CR capsule TAKE 2 CAPSULES BY MOUTH AT BEDTIME.  60 capsule  6  . propranolol (INDERAL LA) 60 MG 24 hr capsule Take 60 mg by mouth 2 (two) times daily.      . propranolol ER (INDERAL LA) 60 MG 24 hr capsule TAKE 1 CAPSULE BY MOUTH TWICE A DAY  180 capsule  1  . rosuvastatin (CRESTOR) 20 MG tablet Take 20 mg by mouth once a week.      . triazolam (HALCION) 0.25 MG tablet TAKE 1 TABLET AT BEDTIME AS NEEDED  30 tablet  5   No current facility-administered medications on file prior to visit.    BP 130/80  Pulse 76  Temp(Src) 98.2 F (36.8 C)  Resp 16  Ht 5' 1.5" (1.562 m)  Wt 162 lb (73.483 kg)  BMI 30.12 kg/m2    Objective:   Physical Exam  Constitutional: She is oriented to person, place, and time. She appears well-developed and well-nourished. No distress.  HENT:  Head: Normocephalic and atraumatic.  Nose: Nose normal.  Eyes: Conjunctivae and EOM are normal. Pupils are equal, round, and reactive to light.  Neck: Normal range of motion. Neck supple. No JVD present. No tracheal deviation present. No thyromegaly present.  Cardiovascular: Normal rate and regular rhythm.   Murmur heard. Pulmonary/Chest: Effort normal and breath sounds normal. She has no wheezes. She exhibits no tenderness.  Abdominal: Soft. Bowel sounds are normal. She exhibits distension. There is tenderness.  Musculoskeletal: Normal range of motion. She exhibits no edema and no tenderness.  Lymphadenopathy:    She has no cervical adenopathy.  Neurological: She is alert and oriented to person, place, and time. She has normal reflexes. No cranial nerve deficit.  Skin: Skin is warm and dry. She is not diaphoretic.  Psychiatric: She has a normal mood and affect. Her behavior is normal.           Assessment & Plan:  Chronic cough related to GERD Trial of zantac 300 bid Stable htn Lipid monitoring   CXR due to chronic cough ( former smoker)

## 2013-06-30 NOTE — Patient Instructions (Signed)
The patient is instructed to continue all medications as prescribed. Schedule followup with check out clerk upon leaving the clinic  

## 2013-07-28 ENCOUNTER — Encounter: Payer: Self-pay | Admitting: Internal Medicine

## 2013-07-28 NOTE — Telephone Encounter (Signed)
Per dr Arnoldo Morale- your chest xray was ok

## 2013-08-19 ENCOUNTER — Other Ambulatory Visit: Payer: Self-pay | Admitting: Internal Medicine

## 2013-09-01 ENCOUNTER — Ambulatory Visit
Admission: RE | Admit: 2013-09-01 | Discharge: 2013-09-01 | Disposition: A | Discharge status: Home / Self Care | Payer: Medicare Other | Source: Ambulatory Visit

## 2013-09-01 DIAGNOSIS — Z1231 Encounter for screening mammogram for malignant neoplasm of breast: Secondary | ICD-10-CM

## 2013-09-17 ENCOUNTER — Other Ambulatory Visit: Payer: Self-pay | Admitting: Internal Medicine

## 2013-10-06 ENCOUNTER — Other Ambulatory Visit: Payer: Self-pay | Admitting: Internal Medicine

## 2013-11-24 ENCOUNTER — Other Ambulatory Visit: Payer: Self-pay | Admitting: Internal Medicine

## 2014-01-01 ENCOUNTER — Encounter: Payer: Self-pay | Admitting: Internal Medicine

## 2014-01-02 ENCOUNTER — Encounter: Payer: Self-pay | Admitting: *Deleted

## 2014-01-02 ENCOUNTER — Encounter: Payer: Self-pay | Admitting: Internal Medicine

## 2014-01-02 ENCOUNTER — Ambulatory Visit (INDEPENDENT_AMBULATORY_CARE_PROVIDER_SITE_OTHER): Payer: Medicare Other | Admitting: Internal Medicine

## 2014-01-02 VITALS — BP 148/90 | HR 64 | Temp 98.0°F | Ht 62.0 in | Wt 160.0 lb

## 2014-01-02 DIAGNOSIS — E785 Hyperlipidemia, unspecified: Secondary | ICD-10-CM

## 2014-01-02 DIAGNOSIS — Z0000 Encounter for general adult medical examination without abnormal findings: Secondary | ICD-10-CM

## 2014-01-02 DIAGNOSIS — I10 Essential (primary) hypertension: Secondary | ICD-10-CM

## 2014-01-02 LAB — CBC WITH DIFFERENTIAL/PLATELET
Basophils Absolute: 0 10*3/uL (ref 0.0–0.1)
Basophils Relative: 0.4 % (ref 0.0–3.0)
EOS ABS: 0.1 10*3/uL (ref 0.0–0.7)
Eosinophils Relative: 2.4 % (ref 0.0–5.0)
HEMATOCRIT: 43.5 % (ref 36.0–46.0)
HEMOGLOBIN: 14.4 g/dL (ref 12.0–15.0)
LYMPHS ABS: 2.1 10*3/uL (ref 0.7–4.0)
Lymphocytes Relative: 35.2 % (ref 12.0–46.0)
MCHC: 33.1 g/dL (ref 30.0–36.0)
MCV: 95.3 fl (ref 78.0–100.0)
Monocytes Absolute: 0.7 10*3/uL (ref 0.1–1.0)
Monocytes Relative: 10.8 % (ref 3.0–12.0)
Neutro Abs: 3.1 10*3/uL (ref 1.4–7.7)
Neutrophils Relative %: 51.2 % (ref 43.0–77.0)
Platelets: 205 10*3/uL (ref 150.0–400.0)
RBC: 4.57 Mil/uL (ref 3.87–5.11)
RDW: 13.4 % (ref 11.5–14.6)
WBC: 6.1 10*3/uL (ref 4.5–10.5)

## 2014-01-02 LAB — LIPID PANEL
Cholesterol: 180 mg/dL (ref 0–200)
HDL: 58.9 mg/dL (ref >39.00)
LDL Cholesterol: 80 mg/dL (ref 0–99)
Total CHOL/HDL Ratio: 3
Triglycerides: 206 mg/dL — ABNORMAL HIGH (ref 0.0–149.0)
VLDL: 41.2 mg/dL — AB (ref 0.0–40.0)

## 2014-01-02 LAB — HEPATIC FUNCTION PANEL
ALT: 21 U/L (ref 0–35)
AST: 25 U/L (ref 0–37)
Albumin: 4.2 g/dL (ref 3.5–5.2)
Alkaline Phosphatase: 59 U/L (ref 39–117)
BILIRUBIN DIRECT: 0 mg/dL (ref 0.0–0.3)
BILIRUBIN TOTAL: 0.5 mg/dL (ref 0.3–1.2)
Total Protein: 7.3 g/dL (ref 6.0–8.3)

## 2014-01-02 LAB — TSH: TSH: 1.67 u[IU]/mL (ref 0.35–5.50)

## 2014-01-02 LAB — BASIC METABOLIC PANEL
BUN: 11 mg/dL (ref 6–23)
CO2: 26 mEq/L (ref 19–32)
Calcium: 9.7 mg/dL (ref 8.4–10.5)
Chloride: 104 mEq/L (ref 96–112)
Creatinine, Ser: 0.7 mg/dL (ref 0.4–1.2)
GFR: 92.39 mL/min (ref >60.00)
Glucose, Bld: 89 mg/dL (ref 70–99)
Potassium: 4.3 mEq/L (ref 3.5–5.1)
Sodium: 138 mEq/L (ref 135–145)

## 2014-01-02 NOTE — Progress Notes (Signed)
Subjective:    Patient ID: Julia [name], female    DOB: [birth year], 63 y.o.   MRN: [id number]  HPI Medicare wellness exam For ARP medicare compete Follow up for HTN Has had pap and mammogram Up to day with all other health best practices Fasting for blood work Slight elevation of blood pressure due to holding medications Review of Systems  Constitutional: Negative for activity change, appetite change and fatigue.  HENT: Negative for congestion, ear pain, postnasal drip and sinus pressure.   Eyes: Negative for redness and visual disturbance.  Respiratory: Negative for cough, shortness of breath and wheezing.   Gastrointestinal: Negative for abdominal pain and abdominal distention.  Genitourinary: Negative for dysuria, frequency and menstrual problem.  Musculoskeletal: Negative for arthralgias, joint swelling, myalgias and neck pain.  Skin: Negative for rash and wound.  Neurological: Negative for dizziness, weakness and headaches.  Hematological: Negative for adenopathy. Does not bruise/bleed easily.  Psychiatric/Behavioral: Negative for sleep disturbance and decreased concentration.   Past Medical History  Diagnosis Date  . Anal fissure 04/29/2008  . BACK PAIN, LUMBAR, WITH RADICULOPATHY 09/21/2008  . BREAST CYSTS, BILATERAL 10/01/2007  . Carpal tunnel syndrome   . CEREBRAL ANEURYSM   . CHEST DISCOMFORT   . CHOLELITHIASIS   . COLITIS   . COMMON MIGRAINE   . CONSTIPATION   . Cough variant asthma   . CROHN'S DISEASE   . DEGENERATIVE DISC DISEASE, CERVICAL SPINE   . Diarrhea   . DIZZINESS, CHRONIC   . FACIAL PARESTHESIA, LEFT   . GERD   . HIATAL HERNIA   . HYPERLIPIDEMIA, BORDERLINE   . HYPERTENSION   . ORGANIC INSOMNIA UNSPECIFIED   . Sarcoidosis   . SLEEP APNEA   . UNSPECIFIED VENOUS INSUFFICIENCY   . CVA (cerebrovascular accident due to intracerebral hemorrhage)     History   Social History  . Marital Status: Married    Spouse Name: N/A    Number of  Children: 2  . Years of Education: N/A   Occupational History  . retired    Social History Main Topics  . Smoking status: Former Smoker    Quit date: 09/12/1979  . Smokeless tobacco: Not on file     Comment: has been quit for 35 years  . Alcohol Use: 4.2 oz/week    7 Glasses of wine per week  . Drug Use: No  . Sexual Activity: Not on file   Other Topics Concern  . Not on file   Social History Narrative  . No narrative on file    Past Surgical History  Procedure Laterality Date  . Breast lumpectomy    . Colectomy  1981    right  . Carpal tunnel release    . Abdominal hysterectomy    . Tonsillectomy      Family History  Problem Relation Age of Onset  . Stomach cancer Father   . Esophageal cancer Father   . Colon cancer Neg Hx     Allergies  Allergen Reactions  . Meperidine Hcl     REACTION: vomiting  . Penicillins     REACTION: rash    Current Outpatient Prescriptions on File Prior to Visit  Medication Sig Dispense Refill  . amLODipine (NORVASC) 2.5 MG tablet TAKE 1 TABLET EVERY DAY  90 tablet  3  . aspirin 81 MG tablet Take 1 tablet (81 mg total) by mouth daily. Monday Wednesday and Friday      . BROMFED DM 30-2-10 MG/5ML syrup TAKE  2 TEASPOONFULS EVERY 6 HOURS AS NEEDED  240 mL  0  . budesonide (PULMICORT FLEXHALER) 180 MCG/ACT inhaler Inhale 1 puff into the lungs daily.      . butalbital-acetaminophen-caffeine (FIORICET, ESGIC) 50-325-40 MG per tablet TAKE ONE TABLET TWICE A DAY AS NEEDED FOR HEADACHE  60 tablet  2  . citalopram (CELEXA) 20 MG tablet TAKE 1 TABLET BY MOUTH EVERY DAY  30 tablet  11  . cloNIDine HCl (KAPVAY) 0.1 MG TB12 ER tablet 1/2 tab as needed for symptomatic bp >170  15 tablet  5  . conjugated estrogens (PREMARIN) vaginal cream Place 0.5 g vaginally daily. Use 3 times a week      . etodolac (LODINE) 400 MG tablet TAKE 1 TABLET BY MOUTH TWICE DAILY AS NEEDED.  60 tablet  6  . fexofenadine (ALLEGRA) 180 MG tablet Take 1 tablet (180 mg  total) by mouth daily.  30 tablet  11  . Flaxseed, Linseed, (FLAXSEED OIL) 1000 MG CAPS Take by mouth daily.        . fluticasone (FLONASE) 50 MCG/ACT nasal spray Place 2 sprays into the nose daily.      Marland Kitchen PENTASA 250 MG CR capsule TAKE 2 CAPSULES BY MOUTH AT BEDTIME.  60 capsule  6  . propranolol (INDERAL LA) 60 MG 24 hr capsule Take 60 mg by mouth 2 (two) times daily.      . propranolol ER (INDERAL LA) 60 MG 24 hr capsule TAKE 1 CAPSULE BY MOUTH TWICE A DAY  180 capsule  1  . ranitidine (ZANTAC) 300 MG tablet Take 1 tablet (300 mg total) by mouth 2 (two) times daily.  60 tablet  11  . rosuvastatin (CRESTOR) 20 MG tablet Take 20 mg by mouth once a week.      . triazolam (HALCION) 0.25 MG tablet TAKE 1 TABLET AT BEDTIME AS NEEDED  30 tablet  5   No current facility-administered medications on file prior to visit.    BP 148/98  Pulse 64  Temp(Src) 98 F (36.7 C) (Oral)  Ht 5' 2"  (1.575 m)  Wt 160 lb (72.576 kg)  BMI 29.26 kg/m2       Objective:   Physical Exam  Constitutional: She is oriented to person, place, and time. She appears well-developed and well-nourished. No distress.  HENT:  Head: Normocephalic and atraumatic.  Eyes: Conjunctivae and EOM are normal. Pupils are equal, round, and reactive to light.  Neck: Normal range of motion. Neck supple. No JVD present. No tracheal deviation present. No thyromegaly present.  Cardiovascular: Normal rate and regular rhythm.   Murmur heard. Pulmonary/Chest: Effort normal and breath sounds normal. She has no wheezes. She exhibits no tenderness.  Abdominal: Soft. Bowel sounds are normal. She exhibits no distension. There is no tenderness.  Musculoskeletal: Normal range of motion. She exhibits no edema and no tenderness.  Lymphadenopathy:    She has no cervical adenopathy.  Neurological: She is alert and oriented to person, place, and time. She has normal reflexes. She displays normal reflexes. No cranial nerve deficit. Coordination  normal.  Skin: Skin is warm and dry. She is not diaphoretic.  Psychiatric: She has a normal mood and affect. Her behavior is normal.          Assessment & Plan:   This is a routine wellness  examination for this patient . I reviewed all health maintenance protocols including mammography, colonoscopy, bone density Needed referrals were placed. Age and diagnosis  appropriate screening labs were  ordered. Her immunization history was reviewed and appropriate vaccinations were ordered. Her current medications and allergies were reviewed and needed refills of her chronic medications were ordered. The plan for yearly health maintenance was discussed all orders and referrals were made as appropriate.   Monitor bmet for HTN  And CBC and liver for drug eefects UA for hx of UTI Lipid for monitoring crestor therapy  Be sure to take probiotic for at least 2 weeks after the antibiotic

## 2014-01-02 NOTE — Patient Instructions (Signed)
We will set up up with follow up with another [facility] provider

## 2014-01-02 NOTE — Progress Notes (Signed)
Pre visit review using our clinic review tool, if applicable. No additional management support is needed unless otherwise documented below in the visit note. 

## 2014-01-05 ENCOUNTER — Telehealth: Payer: Self-pay | Admitting: Internal Medicine

## 2014-01-05 NOTE — Telephone Encounter (Signed)
Relevant patient education assigned to patient using Emmi. ° °

## 2014-01-26 ENCOUNTER — Other Ambulatory Visit: Payer: Self-pay | Admitting: Internal Medicine

## 2014-02-17 ENCOUNTER — Other Ambulatory Visit: Payer: Self-pay | Admitting: Internal Medicine

## 2014-04-17 ENCOUNTER — Other Ambulatory Visit: Payer: Self-pay | Admitting: Internal Medicine

## 2014-05-07 ENCOUNTER — Other Ambulatory Visit: Payer: Self-pay | Admitting: Internal Medicine

## 2014-06-02 ENCOUNTER — Encounter: Payer: Self-pay | Admitting: Gastroenterology

## 2014-06-04 ENCOUNTER — Institutional Professional Consult (permissible substitution): Payer: Medicare Other | Admitting: Emergency Medicine

## 2014-06-05 DIAGNOSIS — J9801 Acute bronchospasm: Secondary | ICD-10-CM | POA: Diagnosis not present

## 2014-06-05 DIAGNOSIS — K219 Gastro-esophageal reflux disease without esophagitis: Secondary | ICD-10-CM | POA: Diagnosis not present

## 2014-06-05 DIAGNOSIS — J309 Allergic rhinitis, unspecified: Secondary | ICD-10-CM | POA: Diagnosis not present

## 2014-06-05 DIAGNOSIS — R05 Cough: Secondary | ICD-10-CM | POA: Diagnosis not present

## 2014-06-05 DIAGNOSIS — R059 Cough, unspecified: Secondary | ICD-10-CM | POA: Diagnosis not present

## 2014-06-08 ENCOUNTER — Telehealth: Payer: Self-pay | Admitting: Internal Medicine

## 2014-06-08 NOTE — Telephone Encounter (Signed)
Pt and her husband  had appt on 10/22 and needs to be resch due to md unable. Patients would like to come together. Can I create 30 min slots ?

## 2014-06-08 NOTE — Telephone Encounter (Signed)
That's fine

## 2014-06-09 NOTE — Telephone Encounter (Signed)
pts has been sch

## 2014-06-15 DIAGNOSIS — J45909 Unspecified asthma, uncomplicated: Secondary | ICD-10-CM | POA: Diagnosis not present

## 2014-06-15 DIAGNOSIS — R05 Cough: Secondary | ICD-10-CM | POA: Diagnosis not present

## 2014-06-15 DIAGNOSIS — J9801 Acute bronchospasm: Secondary | ICD-10-CM | POA: Diagnosis not present

## 2014-06-15 DIAGNOSIS — J309 Allergic rhinitis, unspecified: Secondary | ICD-10-CM | POA: Diagnosis not present

## 2014-06-16 ENCOUNTER — Telehealth: Payer: Self-pay | Admitting: Internal Medicine

## 2014-06-16 NOTE — Telephone Encounter (Signed)
Error

## 2014-06-22 DIAGNOSIS — R1319 Other dysphagia: Secondary | ICD-10-CM | POA: Diagnosis not present

## 2014-06-22 DIAGNOSIS — K5090 Crohn's disease, unspecified, without complications: Secondary | ICD-10-CM | POA: Diagnosis not present

## 2014-06-22 DIAGNOSIS — R6881 Early satiety: Secondary | ICD-10-CM | POA: Diagnosis not present

## 2014-06-22 DIAGNOSIS — R1084 Generalized abdominal pain: Secondary | ICD-10-CM | POA: Diagnosis not present

## 2014-06-23 ENCOUNTER — Telehealth: Payer: Self-pay | Admitting: Family Medicine

## 2014-06-23 NOTE — Telephone Encounter (Signed)
keba you can refill through December appointment (3 months or so)

## 2014-06-23 NOTE — Telephone Encounter (Signed)
[id number] [place] requesting re-fill on PENTASA 250 MG CR capsule

## 2014-06-24 DIAGNOSIS — Z23 Encounter for immunization: Secondary | ICD-10-CM | POA: Diagnosis not present

## 2014-06-24 MED ORDER — MESALAMINE ER 250 MG PO CPCR: 500.0000 mg | ORAL_CAPSULE | Freq: Three times a day (TID) | ORAL | Status: DC

## 2014-06-24 NOTE — Telephone Encounter (Signed)
Medication refilled with 3 refills

## 2014-06-26 ENCOUNTER — Emergency Department (HOSPITAL_COMMUNITY): Payer: Medicare Other

## 2014-06-26 ENCOUNTER — Encounter (HOSPITAL_COMMUNITY): Payer: Self-pay | Admitting: Emergency Medicine

## 2014-06-26 ENCOUNTER — Observation Stay (HOSPITAL_COMMUNITY)
Admission: EM | Admit: 2014-06-26 | Discharge: 2014-07-01 | Disposition: A | Discharge status: Home / Self Care | Payer: Medicare Other | Attending: Orthopedic Surgery | Admitting: Orthopedic Surgery

## 2014-06-26 DIAGNOSIS — I739 Peripheral vascular disease, unspecified: Secondary | ICD-10-CM | POA: Insufficient documentation

## 2014-06-26 DIAGNOSIS — E785 Hyperlipidemia, unspecified: Secondary | ICD-10-CM | POA: Diagnosis not present

## 2014-06-26 DIAGNOSIS — W19XXXA Unspecified fall, initial encounter: Secondary | ICD-10-CM

## 2014-06-26 DIAGNOSIS — Z780 Asymptomatic menopausal state: Secondary | ICD-10-CM | POA: Diagnosis not present

## 2014-06-26 DIAGNOSIS — R569 Unspecified convulsions: Secondary | ICD-10-CM | POA: Insufficient documentation

## 2014-06-26 DIAGNOSIS — K219 Gastro-esophageal reflux disease without esophagitis: Secondary | ICD-10-CM | POA: Insufficient documentation

## 2014-06-26 DIAGNOSIS — W109XXA Fall (on) (from) unspecified stairs and steps, initial encounter: Secondary | ICD-10-CM | POA: Insufficient documentation

## 2014-06-26 DIAGNOSIS — Z87891 Personal history of nicotine dependence: Secondary | ICD-10-CM | POA: Insufficient documentation

## 2014-06-26 DIAGNOSIS — D869 Sarcoidosis, unspecified: Secondary | ICD-10-CM | POA: Insufficient documentation

## 2014-06-26 DIAGNOSIS — W108XXA Fall (on) (from) other stairs and steps, initial encounter: Secondary | ICD-10-CM

## 2014-06-26 DIAGNOSIS — G4730 Sleep apnea, unspecified: Secondary | ICD-10-CM | POA: Insufficient documentation

## 2014-06-26 DIAGNOSIS — S82852A Displaced trimalleolar fracture of left lower leg, initial encounter for closed fracture: Secondary | ICD-10-CM | POA: Diagnosis not present

## 2014-06-26 DIAGNOSIS — I69354 Hemiplegia and hemiparesis following cerebral infarction affecting left non-dominant side: Secondary | ICD-10-CM | POA: Diagnosis not present

## 2014-06-26 DIAGNOSIS — S82852D Displaced trimalleolar fracture of left lower leg, subsequent encounter for closed fracture with routine healing: Secondary | ICD-10-CM | POA: Diagnosis not present

## 2014-06-26 DIAGNOSIS — Z79899 Other long term (current) drug therapy: Secondary | ICD-10-CM | POA: Insufficient documentation

## 2014-06-26 DIAGNOSIS — M898X9 Other specified disorders of bone, unspecified site: Secondary | ICD-10-CM | POA: Insufficient documentation

## 2014-06-26 DIAGNOSIS — S82899A Other fracture of unspecified lower leg, initial encounter for closed fracture: Secondary | ICD-10-CM | POA: Diagnosis present

## 2014-06-26 DIAGNOSIS — K5090 Crohn's disease, unspecified, without complications: Secondary | ICD-10-CM | POA: Insufficient documentation

## 2014-06-26 DIAGNOSIS — S82892S Other fracture of left lower leg, sequela: Secondary | ICD-10-CM

## 2014-06-26 DIAGNOSIS — J45909 Unspecified asthma, uncomplicated: Secondary | ICD-10-CM | POA: Diagnosis not present

## 2014-06-26 DIAGNOSIS — W19XXXD Unspecified fall, subsequent encounter: Secondary | ICD-10-CM

## 2014-06-26 DIAGNOSIS — M25572 Pain in left ankle and joints of left foot: Secondary | ICD-10-CM | POA: Diagnosis not present

## 2014-06-26 DIAGNOSIS — M79662 Pain in left lower leg: Secondary | ICD-10-CM | POA: Diagnosis not present

## 2014-06-26 DIAGNOSIS — Z7982 Long term (current) use of aspirin: Secondary | ICD-10-CM | POA: Diagnosis not present

## 2014-06-26 DIAGNOSIS — I10 Essential (primary) hypertension: Secondary | ICD-10-CM | POA: Diagnosis not present

## 2014-06-26 DIAGNOSIS — S299XXA Unspecified injury of thorax, initial encounter: Secondary | ICD-10-CM | POA: Diagnosis not present

## 2014-06-26 DIAGNOSIS — M1990 Unspecified osteoarthritis, unspecified site: Secondary | ICD-10-CM | POA: Insufficient documentation

## 2014-06-26 DIAGNOSIS — Z8781 Personal history of (healed) traumatic fracture: Secondary | ICD-10-CM

## 2014-06-26 DIAGNOSIS — R52 Pain, unspecified: Secondary | ICD-10-CM

## 2014-06-26 DIAGNOSIS — S9305XA Dislocation of left ankle joint, initial encounter: Secondary | ICD-10-CM

## 2014-06-26 HISTORY — DX: Dislocation of left ankle joint, initial encounter: S93.05XA

## 2014-06-26 HISTORY — DX: Displaced trimalleolar fracture of left lower leg, initial encounter for closed fracture: S82.852A

## 2014-06-26 LAB — CBC WITH DIFFERENTIAL/PLATELET
BASOS ABS: 0 10*3/uL (ref 0.0–0.1)
BASOS PCT: 0 % (ref 0–1)
Eosinophils Absolute: 0.1 10*3/uL (ref 0.0–0.7)
Eosinophils Relative: 2 % (ref 0–5)
HCT: 41.4 % (ref 36.0–46.0)
Hemoglobin: 13.6 g/dL (ref 12.0–15.0)
Lymphocytes Relative: 31 % (ref 12–46)
Lymphs Abs: 1.7 10*3/uL (ref 0.7–4.0)
MCH: 30.4 pg (ref 26.0–34.0)
MCHC: 32.9 g/dL (ref 30.0–36.0)
MCV: 92.6 fL (ref 78.0–100.0)
Monocytes Absolute: 0.5 10*3/uL (ref 0.1–1.0)
Monocytes Relative: 9 % (ref 3–12)
NEUTROS ABS: 3.1 10*3/uL (ref 1.7–7.7)
Neutrophils Relative %: 58 % (ref 43–77)
PLATELETS: 187 10*3/uL (ref 150–400)
RBC: 4.47 MIL/uL (ref 3.87–5.11)
RDW: 12.9 % (ref 11.5–15.5)
WBC: 5.3 10*3/uL (ref 4.0–10.5)

## 2014-06-26 LAB — PROTIME-INR
INR: 0.99 (ref 0.00–1.49)
Prothrombin Time: 13.2 seconds (ref 11.6–15.2)

## 2014-06-26 LAB — BASIC METABOLIC PANEL
ANION GAP: 14 (ref 5–15)
BUN: 16 mg/dL (ref 6–23)
CHLORIDE: 101 meq/L (ref 96–112)
CO2: 23 mEq/L (ref 19–32)
CREATININE: 0.62 mg/dL (ref 0.50–1.10)
Calcium: 9 mg/dL (ref 8.4–10.5)
GFR calc non Af Amer: >90 mL/min (ref >90)
Glucose, Bld: 114 mg/dL — ABNORMAL HIGH (ref 70–99)
Potassium: 3.8 mEq/L (ref 3.7–5.3)
SODIUM: 138 meq/L (ref 137–147)

## 2014-06-26 LAB — CALCIUM: CALCIUM: 9.3 mg/dL (ref 8.4–10.5)

## 2014-06-26 MED ORDER — METHOCARBAMOL 1000 MG/10ML IJ SOLN
500.0000 mg | Freq: Four times a day (QID) | INTRAVENOUS | Status: DC | PRN
  Filled 2014-06-26: qty 10

## 2014-06-26 MED ORDER — HYDROCODONE-ACETAMINOPHEN 5-325 MG PO TABS
1.0000 | ORAL_TABLET | Freq: Four times a day (QID) | ORAL | Status: DC | PRN
  Administered 2014-06-27: 2 via ORAL
  Administered 2014-06-27 (×2): 1 via ORAL
  Administered 2014-06-27: 2 via ORAL
  Filled 2014-06-26 (×2): qty 2

## 2014-06-26 MED ORDER — ONDANSETRON HCL 4 MG/2ML IJ SOLN
4.0000 mg | Freq: Once | INTRAMUSCULAR | Status: AC
  Administered 2014-06-26: 4 mg via INTRAVENOUS
  Filled 2014-06-26: qty 2

## 2014-06-26 MED ORDER — MORPHINE SULFATE 2 MG/ML IJ SOLN
0.5000 mg | INTRAMUSCULAR | Status: DC | PRN
  Administered 2014-06-27 (×2): 0.5 mg via INTRAVENOUS
  Filled 2014-06-26 (×2): qty 1

## 2014-06-26 MED ORDER — METHOCARBAMOL 500 MG PO TABS
500.0000 mg | ORAL_TABLET | Freq: Four times a day (QID) | ORAL | Status: DC | PRN
  Administered 2014-06-27 (×4): 500 mg via ORAL
  Administered 2014-06-27: 1000 mg via ORAL
  Administered 2014-06-28 – 2014-06-30 (×5): 500 mg via ORAL
  Filled 2014-06-26 (×5): qty 1
  Filled 2014-06-26: qty 2
  Filled 2014-06-26 (×5): qty 1

## 2014-06-26 MED ORDER — PROPOFOL 10 MG/ML IV BOLUS
0.5000 mg/kg | Freq: Once | INTRAVENOUS | Status: AC
  Administered 2014-06-26: 35.9 mg via INTRAVENOUS
  Filled 2014-06-26: qty 20

## 2014-06-26 MED ORDER — HYDROMORPHONE HCL 1 MG/ML IJ SOLN
0.5000 mg | Freq: Once | INTRAMUSCULAR | Status: AC
  Administered 2014-06-26: 0.5 mg via INTRAVENOUS
  Filled 2014-06-26: qty 1

## 2014-06-26 MED ORDER — LACTATED RINGERS IV SOLN: INTRAVENOUS | Status: DC

## 2014-06-26 MED ORDER — HYDROMORPHONE HCL 1 MG/ML IJ SOLN
0.5000 mg | Freq: Once | INTRAMUSCULAR | Status: AC
Start: 2014-06-26 — End: 2014-06-26
  Administered 2014-06-26: 0.5 mg via INTRAVENOUS
  Filled 2014-06-26: qty 1

## 2014-06-26 NOTE — Progress Notes (Signed)
Orthopedic Tech Progress Note Patient Details:  Julia [name] 1951-03-12 [id number]  Patient ID: Julia [name], female   DOB: 05/19/1951, 63 y.o.   MRN: [id number] As ordered by Dr. Brayton Mars, [facility]chem 06/26/2014, 8:35 PM

## 2014-06-26 NOTE — ED Notes (Signed)
Attempted report 

## 2014-06-26 NOTE — Consult Note (Signed)
Orthopaedic Trauma Service Consultation  Reason for Consult: left ankle trimalleolar fracture dislocation Referring Physician: Ernestine Conrad, MD  Julia [name] is an 63 y.o. female.  HPI: Stumbled on several steps sustaining a severe fracture dislocation of the left ankle. Closed reduced in the ED with dramatic improvement in alignment but still lateral talar subluxation.  Lives one hour [place] half away in [place] but has local social support. Previously followed by Benay Pillow, MD, and now by Dr. Yong Channel from [facility] for occasional spikes of hypertension treated with Clonidine. Denies other injury.  No EtOH tonight.  Past Medical History  Diagnosis Date  . Anal fissure 04/29/2008  . BACK PAIN, LUMBAR, WITH RADICULOPATHY 09/21/2008  . BREAST CYSTS, BILATERAL 10/01/2007  . Carpal tunnel syndrome   . CEREBRAL ANEURYSM   . CHEST DISCOMFORT   . CHOLELITHIASIS   . COLITIS   . COMMON MIGRAINE   . CONSTIPATION   . Cough variant asthma   . CROHN'S DISEASE   . DEGENERATIVE DISC DISEASE, CERVICAL SPINE   . Diarrhea   . DIZZINESS, CHRONIC   . FACIAL PARESTHESIA, LEFT   . GERD   . HIATAL HERNIA   . HYPERLIPIDEMIA, BORDERLINE   . HYPERTENSION   . ORGANIC INSOMNIA UNSPECIFIED   . Sarcoidosis   . SLEEP APNEA   . UNSPECIFIED VENOUS INSUFFICIENCY   . CVA (cerebrovascular accident due to intracerebral hemorrhage)     Past Surgical History  Procedure Laterality Date  . Breast lumpectomy    . Colectomy  1981    right  . Carpal tunnel release    . Abdominal hysterectomy    . Tonsillectomy      Family History  Problem Relation Age of Onset  . Stomach cancer Father   . Esophageal cancer Father   . Colon cancer Neg Hx     Social History:  reports that she quit smoking about 34 years ago. She does not have any smokeless tobacco history on file. She reports that she drinks about 4.2 ounces of alcohol per week. She reports that she does not use illicit drugs.  Allergies:  Allergies   Allergen Reactions  . Meperidine Hcl Nausea And Vomiting  . Penicillins Itching and Rash    Medications: I have reviewed the patient's current medications.  Results for orders placed during the hospital encounter of 06/26/14 (from the past 48 hour(s))  CBC WITH DIFFERENTIAL     Status: None   Collection Time    06/26/14  7:01 PM      Result Value Ref Range   WBC 5.3  4.0 - 10.5 K/uL   RBC 4.47  3.87 - 5.11 MIL/uL   Hemoglobin 13.6  12.0 - 15.0 g/dL   HCT 41.4  36.0 - 46.0 %   MCV 92.6  78.0 - 100.0 fL   MCH 30.4  26.0 - 34.0 pg   MCHC 32.9  30.0 - 36.0 g/dL   RDW 12.9  11.5 - 15.5 %   Platelets 187  150 - 400 K/uL   Neutrophils Relative % 58  43 - 77 %   Neutro Abs 3.1  1.7 - 7.7 K/uL   Lymphocytes Relative 31  12 - 46 %   Lymphs Abs 1.7  0.7 - 4.0 K/uL   Monocytes Relative 9  3 - 12 %   Monocytes Absolute 0.5  0.1 - 1.0 K/uL   Eosinophils Relative 2  0 - 5 %   Eosinophils Absolute 0.1  0.0 - 0.7  K/uL   Basophils Relative 0  0 - 1 %   Basophils Absolute 0.0  0.0 - 0.1 K/uL  BASIC METABOLIC PANEL     Status: Abnormal   Collection Time    06/26/14  7:01 PM      Result Value Ref Range   Sodium 138  137 - 147 mEq/L   Potassium 3.8  3.7 - 5.3 mEq/L   Chloride 101  96 - 112 mEq/L   CO2 23  19 - 32 mEq/L   Glucose, Bld 114 (*) 70 - 99 mg/dL   BUN 16  6 - 23 mg/dL   Creatinine, Ser 0.62  0.50 - 1.10 mg/dL   Calcium 9.0  8.4 - 10.5 mg/dL   GFR calc non Af Amer >90  >90 mL/min   GFR calc Af Amer >90  >90 mL/min   Comment: (NOTE)     The eGFR has been calculated using the CKD EPI equation.     This calculation has not been validated in all clinical situations.     eGFR's persistently <90 mL/min signify possible Chronic Kidney     Disease.   Anion gap 14  5 - 15  PROTIME-INR     Status: None   Collection Time    06/26/14  7:01 PM      Result Value Ref Range   Prothrombin Time 13.2  11.6 - 15.2 seconds   INR 0.99  0.00 - 1.49    Dg Chest Port 1 View  06/26/2014    CLINICAL DATA:  Status post fall.  Left ankle fracture.  EXAM: PORTABLE CHEST - 1 VIEW  COMPARISON:  None.  FINDINGS: The lungs are clear. Heart size is normal. No pneumothorax or pleural effusion.  IMPRESSION: No acute disease.   Electronically Signed   By: Inge Rise M.D.   On: 06/26/2014 19:45   Dg Ankle Left Port  06/26/2014   CLINICAL DATA:  Status post close reduction left ankle fracture, subsequent evaluation  EXAM: PORTABLE LEFT ANKLE - 2 VIEW  COMPARISON:  06/26/2014  FINDINGS: Cast obscures bony detail. Previously severely displaced distal fibular fracture now shows more approximately anatomic positioning and alignment. There remains apex anterior angulation, but it is also decreased. Mildly displaced medial malleolar fracture identified. Mildly displaced posterior malleolar fracture also identified. Anterior dislocation of tibia on talus has been eliminated.  IMPRESSION: Trimalleolar fracture. Fracture and dislocation have been reduced when compared to prior study.   Electronically Signed   By: Skipper Cliche M.D.   On: 06/26/2014 20:58   Dg Ankle Left Port  06/26/2014   CLINICAL DATA:  Fall.  Pain.  Deformity  EXAM: PORTABLE LEFT ANKLE - 2 VIEW  COMPARISON:  None.  FINDINGS: There is a fracture dislocation involving the left ankle. A comminuted fracture deformity involves the distal fibula. There is posterior dislocation of the tibiotalar joint. Posterior malleolar fracture is suspected.  IMPRESSION: 1. Comminuted, fracture dislocation of the left ankle. Repeat imaging after reduction of tibiotalar joint recommended.   Electronically Signed   By: Kerby Moors M.D.   On: 06/26/2014 19:45    ROS No recent fever, bleeding abnormalities, urologic dysfunction, GI problems, or weight gain. Blood pressure 113/72, pulse 59, temperature 98.2 F (36.8 C), temperature source Oral, resp. rate 16, height 5' 2" (1.575 m), weight 158 lb (71.668 kg), SpO2 99.00%. Physical Exam RRR CTA  bilat S/NT/ ND LLE In a splint  Knee nontender  Sens DPN, SPN, TN intact  Brisk  CR; no toe edema UEX: nontender wrists and elbows without ecchymosis RLE mild tenderness of right ankle lateral malleolus    Assessment/Plan: Left ankle trimalleolar dislocation s/p reduction, but with persistent subluxation and angulation; foot in equinus. Right ankle lateral malleolus tenderness  I discussed with the patient and her family the risks and benefits of surgery with external fixation versus open repair, including the possibility of infection, pin tract infection, nerve injury, vessel injury, wound breakdown, arthritis, symptomatic hardware, DVT/ PE, loss of motion, and need for further surgery among others.  We also specifically discussed the possible need to stage surgery because of the elevated risk of soft tissue breakdown that could lead to amputation.  She acknowledged these risks and wished to proceed.   Altamese Sumner, MD [facility] [phone] (p)   06/26/2014  10:55 PM

## 2014-06-26 NOTE — ED Notes (Signed)
Per EMS- pt was walking up stairs and fell. Obvious deformity to left foot. 20 G PIV RAC placed, PTA, given 150 fentanyl in route. Pain 7/10 at current. NSR. BS 109. Denies LOC. Hypertensive on scene.

## 2014-06-26 NOTE — Sedation Documentation (Signed)
Reduction accomplished at this time.

## 2014-06-26 NOTE — Progress Notes (Signed)
Orthopedic Tech Progress Note Patient Details:  Julia [name] December 22, 1950 [id number]  Ortho Devices Type of Ortho Device: Post (short leg) splint;Stirrup splint Ortho Device/Splint Location: lle Ortho Device/Splint Interventions: Application   [name] 06/26/2014, 8:35 PM

## 2014-06-26 NOTE — ED Notes (Signed)
Per Yvone Neu, EMT, pt is requesting more pain medication.

## 2014-06-26 NOTE — ED Provider Notes (Signed)
CSN: [id number]     Arrival date & time 06/26/14  1848 History   First MD Initiated Contact with Patient 06/26/14 1850     Chief Complaint  Patient presents with  . Foot Injury    left     (Consider location/radiation/quality/duration/timing/severity/associated sxs/prior Treatment) HPI Comments: Patient presents to the ER for evaluation of left ankle injury. Patient reports that she lost her balance walking down steps and fell. She reports that she twisted the ankle and heard it pop. She then fell onto her backside, into the seated position. No head injury. Patient denies headache, neck pain, back pain. No upper extremity pain. Patient reports constant severe pain in the ankle. She is brought to the ER by EMS, having been administered fentanyl IV without improvement of her pain.  Patient is a 63 y.o. female presenting with foot injury.  Foot Injury   Past Medical History  Diagnosis Date  . Anal fissure 04/29/2008  . BACK PAIN, LUMBAR, WITH RADICULOPATHY 09/21/2008  . BREAST CYSTS, BILATERAL 10/01/2007  . Carpal tunnel syndrome   . CEREBRAL ANEURYSM   . CHEST DISCOMFORT   . CHOLELITHIASIS   . COLITIS   . COMMON MIGRAINE   . CONSTIPATION   . Cough variant asthma   . CROHN'S DISEASE   . DEGENERATIVE DISC DISEASE, CERVICAL SPINE   . Diarrhea   . DIZZINESS, CHRONIC   . FACIAL PARESTHESIA, LEFT   . GERD   . HIATAL HERNIA   . HYPERLIPIDEMIA, BORDERLINE   . HYPERTENSION   . ORGANIC INSOMNIA UNSPECIFIED   . Sarcoidosis   . SLEEP APNEA   . UNSPECIFIED VENOUS INSUFFICIENCY   . CVA (cerebrovascular accident due to intracerebral hemorrhage)    Past Surgical History  Procedure Laterality Date  . Breast lumpectomy    . Colectomy  1981    right  . Carpal tunnel release    . Abdominal hysterectomy    . Tonsillectomy     Family History  Problem Relation Age of Onset  . Stomach cancer Father   . Esophageal cancer Father   . Colon cancer Neg Hx    History  Substance Use Topics   . Smoking status: Former Smoker    Quit date: 09/12/1979  . Smokeless tobacco: Not on file     Comment: has been quit for 35 years  . Alcohol Use: 4.2 oz/week    7 Glasses of wine per week   OB History   Grav Para Term Preterm Abortions TAB SAB Ect Mult Living                 Review of Systems  Musculoskeletal: Positive for arthralgias.  All other systems reviewed and are negative.     Allergies  Meperidine hcl and Penicillins  Home Medications   Prior to Admission medications   Medication Sig Start Date End Date Taking? Authorizing Provider  albuterol (PROVENTIL HFA;VENTOLIN HFA) 108 (90 BASE) MCG/ACT inhaler Inhale 2 puffs into the lungs every 6 (six) hours as needed for wheezing or shortness of breath.   Yes Historical Provider, MD  amLODipine (NORVASC) 2.5 MG tablet Take 2.5 mg by mouth daily.   Yes Historical Provider, MD  aspirin 81 MG tablet Take 81 mg by mouth daily.   Yes Historical Provider, MD  butalbital-acetaminophen-caffeine (FIORICET, ESGIC) 50-325-40 MG per tablet Take 1 tablet by mouth as needed for headache or migraine.   Yes Historical Provider, MD  citalopram (CELEXA) 20 MG tablet Take 20 mg by mouth  every evening.   Yes Historical Provider, MD  cloNIDine (CATAPRES) 0.1 MG tablet Take 0.1 mg by mouth as needed (for systolic BP >962).   Yes Historical Provider, MD  fexofenadine (ALLEGRA) 180 MG tablet Take 1 tablet (180 mg total) by mouth daily. 03/31/11  Yes Ricard Dillon, MD  mesalamine (PENTASA) 250 MG CR capsule Take 500 mg by mouth every evening.   Yes Historical Provider, MD  omeprazole (PRILOSEC OTC) 20 MG tablet Take 20 mg by mouth daily.   Yes Historical Provider, MD  propranolol (INNOPRAN XL) 80 MG 24 hr capsule Take 80 mg by mouth 2 (two) times daily.   Yes Historical Provider, MD  rosuvastatin (CRESTOR) 20 MG tablet Take 20 mg by mouth 2 (two) times a week. Takes on Mon and Fri 09/08/11  Yes   triazolam (HALCION) 0.25 MG tablet TAKE 1 TABLET AT  BEDTIME AS NEEDED   Yes Ricard Dillon, MD  triazolam (HALCION) 0.25 MG tablet Take 0.125 mg by mouth at bedtime.   Yes Historical Provider, MD   BP 113/72  Pulse 59  Temp(Src) 98.2 F (36.8 C) (Oral)  Resp 16  Ht 5' 2"  (1.575 m)  Wt 158 lb (71.668 kg)  BMI 28.89 kg/m2  SpO2 99% Physical Exam  Constitutional: She is oriented to person, place, and time. She appears well-developed and well-nourished. No distress.  HENT:  Head: Normocephalic and atraumatic.  Right Ear: Hearing normal.  Left Ear: Hearing normal.  Nose: Nose normal.  Mouth/Throat: Oropharynx is clear and moist and mucous membranes are normal.  Eyes: Conjunctivae and EOM are normal. Pupils are equal, round, and reactive to light.  Neck: Normal range of motion. Neck supple.  Cardiovascular: Regular rhythm, S1 normal and S2 normal.  Exam reveals no gallop and no friction rub.   No murmur heard. Pulmonary/Chest: Effort normal and breath sounds normal. No respiratory distress. She exhibits no tenderness.  Abdominal: Soft. Normal appearance and bowel sounds are normal. There is no hepatosplenomegaly. There is no tenderness. There is no rebound, no guarding, no tenderness at McBurney's point and negative Murphy's sign. No hernia.  Musculoskeletal: Normal range of motion.  Neurological: She is alert and oriented to person, place, and time. She has normal strength. No cranial nerve deficit or sensory deficit. Coordination normal. GCS eye subscore is 4. GCS verbal subscore is 5. GCS motor subscore is 6.  Skin: Skin is warm, dry and intact. No rash noted. No cyanosis.  Psychiatric: She has a normal mood and affect. Her speech is normal and behavior is normal. Thought content normal.    ED Course  Procedural sedation Date/Time: 06/27/2014 5:10 PM Performed by: Orpah Greek. Authorized by: Orpah Greek Consent: Verbal consent obtained. written consent obtained. Risks and benefits: risks, benefits and  alternatives were discussed Consent given by: patient Patient understanding: patient states understanding of the procedure being performed Patient consent: the patient's understanding of the procedure matches consent given Procedure consent: procedure consent matches procedure scheduled Relevant documents: relevant documents present and verified Test results: test results available and properly labeled Site marked: the operative site was marked Imaging studies: imaging studies available Patient identity confirmed: verbally with patient, arm band and hospital-assigned identification number Time out: Immediately prior to procedure a "time out" was called to verify the correct patient, procedure, equipment, support staff and site/side marked as required. Patient sedated: yes Sedatives: propofol Sedation start date/time: 06/27/2014 8:23 PM Sedation end date/time: 06/27/2014 8:31 PM  ORTHOPEDIC INJURY TREATMENT Date/Time:  06/27/2014 5:12 PM Performed by: Orpah Greek. Authorized by: Orpah Greek Consent: Verbal consent obtained. written consent obtained. Risks and benefits: risks, benefits and alternatives were discussed Consent given by: patient Patient understanding: patient states understanding of the procedure being performed Patient consent: the patient's understanding of the procedure matches consent given Procedure consent: procedure consent matches procedure scheduled Relevant documents: relevant documents present and verified Test results: test results available and properly labeled Site marked: the operative site was marked Imaging studies: imaging studies available Patient identity confirmed: verbally with patient, arm band and hospital-assigned identification number Injury location: ankle Location details: left ankle Injury type: fracture-dislocation Pre-procedure neurovascular assessment: neurovascularly intact Pre-procedure distal perfusion:  normal Pre-procedure neurological function: normal Pre-procedure range of motion: normal Patient sedated: yes Sedatives: propofol Manipulation performed: yes Skeletal traction used: yes Reduction successful: yes X-ray confirmed reduction: yes Immobilization: splint Splint type: ankle stirrup and short leg Post-procedure neurovascular assessment: post-procedure neurovascularly intact Post-procedure distal perfusion: normal Post-procedure neurological function: normal Post-procedure range of motion: normal Patient tolerance: Patient tolerated the procedure well with no immediate complications.   (including critical care time) Labs Review Labs Reviewed  BASIC METABOLIC PANEL - Abnormal; Notable for the following:    Glucose, Bld 114 (*)    All other components within normal limits  CBC WITH DIFFERENTIAL  PROTIME-INR  PROTIME-INR  URINALYSIS, ROUTINE W REFLEX MICROSCOPIC  CBC WITH DIFFERENTIAL  COMPREHENSIVE METABOLIC PANEL  CALCIUM  VITAMIN D 25 HYDROXY  TYPE AND SCREEN    Imaging Review Dg Chest Port 1 View  06/26/2014   CLINICAL DATA:  Status post fall.  Left ankle fracture.  EXAM: PORTABLE CHEST - 1 VIEW  COMPARISON:  None.  FINDINGS: The lungs are clear. Heart size is normal. No pneumothorax or pleural effusion.  IMPRESSION: No acute disease.   Electronically Signed   By: Inge Rise M.D.   On: 06/26/2014 19:45   Dg Ankle Left Port  06/26/2014   CLINICAL DATA:  Status post close reduction left ankle fracture, subsequent evaluation  EXAM: PORTABLE LEFT ANKLE - 2 VIEW  COMPARISON:  06/26/2014  FINDINGS: Cast obscures bony detail. Previously severely displaced distal fibular fracture now shows more approximately anatomic positioning and alignment. There remains apex anterior angulation, but it is also decreased. Mildly displaced medial malleolar fracture identified. Mildly displaced posterior malleolar fracture also identified. Anterior dislocation of tibia on talus has  been eliminated.  IMPRESSION: Trimalleolar fracture. Fracture and dislocation have been reduced when compared to prior study.   Electronically Signed   By: Skipper Cliche M.D.   On: 06/26/2014 20:58   Dg Ankle Left Port  06/26/2014   CLINICAL DATA:  Fall.  Pain.  Deformity  EXAM: PORTABLE LEFT ANKLE - 2 VIEW  COMPARISON:  None.  FINDINGS: There is a fracture dislocation involving the left ankle. A comminuted fracture deformity involves the distal fibula. There is posterior dislocation of the tibiotalar joint. Posterior malleolar fracture is suspected.  IMPRESSION: 1. Comminuted, fracture dislocation of the left ankle. Repeat imaging after reduction of tibiotalar joint recommended.   Electronically Signed   By: Kerby Moors M.D.   On: 06/26/2014 19:45     EKG Interpretation   Date/Time:  Friday June 26 2014 21:33:29 EDT Ventricular Rate:  57 PR Interval:  169 QRS Duration: 81 QT Interval:  465 QTC Calculation: 453 R Axis:   -10 Text Interpretation:  Sinus rhythm Low voltage, precordial leads Consider  anterior infarct No significant change since last tracing Confirmed  by  Betsey Holiday  MD, Harrell Gave [phone]) on 06/26/2014 9:39:26 PM      MDM   Final diagnoses:  H/O reduction of closed fracture   trimalleolar fracture left ankle  Patient presents to the ER for evaluation of injury to left ankle. Patient slipped on stairs and twisted her ankle. She did not fall down the stairs, landing in a seated position. There was no other injury. Patient had obvious deformity of the left ankle upon arrival. There was no open wounds and she did have palpable pulses. X-ray confirmed fracture dislocation. Patient underwent successful reduction in the ER. Case discussed with Doctor Marcelino Scot, on call for orthopedics. He recommends admission with surgical intervention tomorrow.    Orpah Greek, MD 06/27/14 [phone]

## 2014-06-27 ENCOUNTER — Encounter (HOSPITAL_COMMUNITY): Payer: Medicare Other | Admitting: Anesthesiology

## 2014-06-27 ENCOUNTER — Observation Stay (HOSPITAL_COMMUNITY): Payer: Medicare Other

## 2014-06-27 ENCOUNTER — Encounter (HOSPITAL_COMMUNITY): Payer: Self-pay | Admitting: Anesthesiology

## 2014-06-27 ENCOUNTER — Observation Stay (HOSPITAL_COMMUNITY): Payer: Medicare Other | Admitting: Anesthesiology

## 2014-06-27 ENCOUNTER — Encounter (HOSPITAL_COMMUNITY)
Admission: EM | Disposition: A | Discharge status: Home / Self Care | Payer: Self-pay | Source: Home / Self Care | Attending: Emergency Medicine

## 2014-06-27 DIAGNOSIS — S8991XA Unspecified injury of right lower leg, initial encounter: Secondary | ICD-10-CM | POA: Diagnosis not present

## 2014-06-27 DIAGNOSIS — S82852D Displaced trimalleolar fracture of left lower leg, subsequent encounter for closed fracture with routine healing: Secondary | ICD-10-CM | POA: Diagnosis not present

## 2014-06-27 DIAGNOSIS — E785 Hyperlipidemia, unspecified: Secondary | ICD-10-CM | POA: Diagnosis not present

## 2014-06-27 DIAGNOSIS — K219 Gastro-esophageal reflux disease without esophagitis: Secondary | ICD-10-CM | POA: Diagnosis not present

## 2014-06-27 DIAGNOSIS — S82852B Displaced trimalleolar fracture of left lower leg, initial encounter for open fracture type I or II: Secondary | ICD-10-CM | POA: Diagnosis not present

## 2014-06-27 DIAGNOSIS — S82852A Displaced trimalleolar fracture of left lower leg, initial encounter for closed fracture: Secondary | ICD-10-CM | POA: Diagnosis not present

## 2014-06-27 DIAGNOSIS — D869 Sarcoidosis, unspecified: Secondary | ICD-10-CM | POA: Diagnosis not present

## 2014-06-27 DIAGNOSIS — M25571 Pain in right ankle and joints of right foot: Secondary | ICD-10-CM | POA: Diagnosis not present

## 2014-06-27 DIAGNOSIS — S99911A Unspecified injury of right ankle, initial encounter: Secondary | ICD-10-CM | POA: Diagnosis not present

## 2014-06-27 DIAGNOSIS — M7989 Other specified soft tissue disorders: Secondary | ICD-10-CM | POA: Diagnosis not present

## 2014-06-27 DIAGNOSIS — M545 Low back pain: Secondary | ICD-10-CM | POA: Diagnosis not present

## 2014-06-27 DIAGNOSIS — W108XXA Fall (on) (from) other stairs and steps, initial encounter: Secondary | ICD-10-CM | POA: Diagnosis not present

## 2014-06-27 HISTORY — PX: EXTERNAL FIXATION LEG: SHX1549

## 2014-06-27 HISTORY — PX: ANKLE FRACTURE SURGERY: SHX122

## 2014-06-27 LAB — CBC WITH DIFFERENTIAL/PLATELET
BASOS PCT: 0 % (ref 0–1)
Basophils Absolute: 0 10*3/uL (ref 0.0–0.1)
EOS PCT: 0 % (ref 0–5)
Eosinophils Absolute: 0 10*3/uL (ref 0.0–0.7)
HEMATOCRIT: 39.9 % (ref 36.0–46.0)
HEMOGLOBIN: 13.2 g/dL (ref 12.0–15.0)
Lymphocytes Relative: 22 % (ref 12–46)
Lymphs Abs: 1.5 10*3/uL (ref 0.7–4.0)
MCH: 31.1 pg (ref 26.0–34.0)
MCHC: 33.1 g/dL (ref 30.0–36.0)
MCV: 93.9 fL (ref 78.0–100.0)
MONO ABS: 0.7 10*3/uL (ref 0.1–1.0)
MONOS PCT: 10 % (ref 3–12)
Neutro Abs: 4.8 10*3/uL (ref 1.7–7.7)
Neutrophils Relative %: 68 % (ref 43–77)
Platelets: 195 10*3/uL (ref 150–400)
RBC: 4.25 MIL/uL (ref 3.87–5.11)
RDW: 12.9 % (ref 11.5–15.5)
WBC: 7.1 10*3/uL (ref 4.0–10.5)

## 2014-06-27 LAB — URINALYSIS, ROUTINE W REFLEX MICROSCOPIC
Bilirubin Urine: NEGATIVE
Glucose, UA: NEGATIVE mg/dL
Hgb urine dipstick: NEGATIVE
KETONES UR: NEGATIVE mg/dL
Leukocytes, UA: NEGATIVE
Nitrite: NEGATIVE
PH: 6 (ref 5.0–8.0)
Protein, ur: NEGATIVE mg/dL
Specific Gravity, Urine: 1.022 (ref 1.005–1.030)
Urobilinogen, UA: 0.2 mg/dL (ref 0.0–1.0)

## 2014-06-27 LAB — COMPREHENSIVE METABOLIC PANEL
ALBUMIN: 3.4 g/dL — AB (ref 3.5–5.2)
ALT: 16 U/L (ref 0–35)
ANION GAP: 14 (ref 5–15)
AST: 16 U/L (ref 0–37)
Alkaline Phosphatase: 67 U/L (ref 39–117)
BUN: 15 mg/dL (ref 6–23)
CALCIUM: 8.9 mg/dL (ref 8.4–10.5)
CO2: 26 mEq/L (ref 19–32)
CREATININE: 0.72 mg/dL (ref 0.50–1.10)
Chloride: 98 mEq/L (ref 96–112)
GFR calc Af Amer: >90 mL/min (ref >90)
GFR calc non Af Amer: 88 mL/min — ABNORMAL LOW (ref >90)
Glucose, Bld: 139 mg/dL — ABNORMAL HIGH (ref 70–99)
Potassium: 3.8 mEq/L (ref 3.7–5.3)
Sodium: 138 mEq/L (ref 137–147)
TOTAL PROTEIN: 6.8 g/dL (ref 6.0–8.3)
Total Bilirubin: 0.3 mg/dL (ref 0.3–1.2)

## 2014-06-27 LAB — PROTIME-INR
INR: 1.01 (ref 0.00–1.49)
Prothrombin Time: 13.4 seconds (ref 11.6–15.2)

## 2014-06-27 LAB — SURGICAL PCR SCREEN
MRSA, PCR: NEGATIVE
Staphylococcus aureus: NEGATIVE

## 2014-06-27 LAB — TYPE AND SCREEN
ABO/RH(D): B POS
ANTIBODY SCREEN: NEGATIVE

## 2014-06-27 LAB — ABO/RH: ABO/RH(D): B POS

## 2014-06-27 SURGERY — EXTERNAL FIXATION, LOWER EXTREMITY
Anesthesia: General | Site: Ankle | Laterality: Left

## 2014-06-27 MED ORDER — LACTATED RINGERS IV SOLN
INTRAVENOUS | Status: DC | PRN
  Administered 2014-06-27 (×2): via INTRAVENOUS

## 2014-06-27 MED ORDER — METHOCARBAMOL 500 MG PO TABS
ORAL_TABLET | ORAL | Status: AC
  Administered 2014-06-27: 500 mg via ORAL
  Filled 2014-06-27: qty 1

## 2014-06-27 MED ORDER — MAGNESIUM CITRATE PO SOLN: 1.0000 | Freq: Once | ORAL | Status: AC | PRN

## 2014-06-27 MED ORDER — BISACODYL 5 MG PO TBEC
5.0000 mg | DELAYED_RELEASE_TABLET | Freq: Every day | ORAL | Status: DC | PRN
  Administered 2014-06-30: 5 mg via ORAL
  Filled 2014-06-27 (×2): qty 1

## 2014-06-27 MED ORDER — PHENYLEPHRINE 40 MCG/ML (10ML) SYRINGE FOR IV PUSH (FOR BLOOD PRESSURE SUPPORT)
PREFILLED_SYRINGE | INTRAVENOUS | Status: AC
  Filled 2014-06-27: qty 10

## 2014-06-27 MED ORDER — VANCOMYCIN HCL IN DEXTROSE 1-5 GM/200ML-% IV SOLN
1000.0000 mg | Freq: Two times a day (BID) | INTRAVENOUS | Status: AC
  Administered 2014-06-27: 1000 mg via INTRAVENOUS
  Filled 2014-06-27: qty 200

## 2014-06-27 MED ORDER — FENTANYL CITRATE 0.05 MG/ML IJ SOLN
INTRAMUSCULAR | Status: DC | PRN
  Administered 2014-06-27: 25 ug via INTRAVENOUS
  Administered 2014-06-27: 50 ug via INTRAVENOUS
  Administered 2014-06-27: 25 ug via INTRAVENOUS
  Administered 2014-06-27: 50 ug via INTRAVENOUS

## 2014-06-27 MED ORDER — TRIAZOLAM 0.125 MG PO TABS
0.1250 mg | ORAL_TABLET | Freq: Every day | ORAL | Status: DC
  Administered 2014-06-27 – 2014-06-30 (×3): 0.125 mg via ORAL
  Filled 2014-06-27 (×5): qty 1

## 2014-06-27 MED ORDER — DEXAMETHASONE SODIUM PHOSPHATE 4 MG/ML IJ SOLN
INTRAMUSCULAR | Status: AC
  Filled 2014-06-27: qty 1

## 2014-06-27 MED ORDER — LIDOCAINE HCL (CARDIAC) 20 MG/ML IV SOLN
INTRAVENOUS | Status: AC
  Filled 2014-06-27: qty 5

## 2014-06-27 MED ORDER — POTASSIUM CHLORIDE IN NACL 20-0.9 MEQ/L-% IV SOLN
INTRAVENOUS | Status: DC
  Filled 2014-06-27 (×5): qty 1000

## 2014-06-27 MED ORDER — HYDROMORPHONE HCL 1 MG/ML IJ SOLN
0.5000 mg | INTRAMUSCULAR | Status: DC | PRN
  Administered 2014-06-27 (×2): 1 mg via INTRAVENOUS
  Filled 2014-06-27 (×2): qty 1

## 2014-06-27 MED ORDER — SODIUM CHLORIDE 0.9 % IR SOLN
Status: DC | PRN
  Administered 2014-06-27: 1000 mL

## 2014-06-27 MED ORDER — ONDANSETRON HCL 4 MG PO TABS
4.0000 mg | ORAL_TABLET | Freq: Four times a day (QID) | ORAL | Status: DC | PRN
  Administered 2014-06-28: 4 mg via ORAL
  Filled 2014-06-27: qty 1

## 2014-06-27 MED ORDER — METOCLOPRAMIDE HCL 5 MG PO TABS
5.0000 mg | ORAL_TABLET | Freq: Three times a day (TID) | ORAL | Status: DC | PRN
  Filled 2014-06-27: qty 2

## 2014-06-27 MED ORDER — OXYCODONE HCL 5 MG PO TABS
5.0000 mg | ORAL_TABLET | ORAL | Status: DC | PRN
  Administered 2014-06-27 – 2014-06-28 (×5): 10 mg via ORAL
  Filled 2014-06-27 (×5): qty 2

## 2014-06-27 MED ORDER — CITALOPRAM HYDROBROMIDE 20 MG PO TABS
20.0000 mg | ORAL_TABLET | Freq: Every evening | ORAL | Status: DC
  Administered 2014-06-27 – 2014-06-30 (×4): 20 mg via ORAL
  Filled 2014-06-27 (×5): qty 1

## 2014-06-27 MED ORDER — PROPRANOLOL HCL ER 80 MG PO CP24
80.0000 mg | ORAL_CAPSULE | Freq: Two times a day (BID) | ORAL | Status: DC
  Administered 2014-06-27 – 2014-07-01 (×9): 80 mg via ORAL
  Filled 2014-06-27 (×11): qty 1

## 2014-06-27 MED ORDER — WHITE PETROLATUM GEL
Status: AC
  Filled 2014-06-27: qty 5

## 2014-06-27 MED ORDER — ONDANSETRON HCL 4 MG/2ML IJ SOLN
INTRAMUSCULAR | Status: AC
  Filled 2014-06-27: qty 2

## 2014-06-27 MED ORDER — CLONIDINE HCL 0.1 MG PO TABS
0.1000 mg | ORAL_TABLET | ORAL | Status: DC | PRN
  Filled 2014-06-27: qty 1

## 2014-06-27 MED ORDER — HYDROMORPHONE HCL 1 MG/ML IJ SOLN
0.2500 mg | INTRAMUSCULAR | Status: DC | PRN
  Administered 2014-06-27 (×4): 0.5 mg via INTRAVENOUS

## 2014-06-27 MED ORDER — GLYCOPYRROLATE 0.2 MG/ML IJ SOLN
INTRAMUSCULAR | Status: DC | PRN
  Administered 2014-06-27: 0.2 mg via INTRAVENOUS

## 2014-06-27 MED ORDER — ENOXAPARIN SODIUM 40 MG/0.4ML ~~LOC~~ SOLN
40.0000 mg | SUBCUTANEOUS | Status: DC
  Administered 2014-06-28 – 2014-07-01 (×4): 40 mg via SUBCUTANEOUS
  Filled 2014-06-27 (×6): qty 0.4

## 2014-06-27 MED ORDER — ONDANSETRON HCL 4 MG/2ML IJ SOLN
4.0000 mg | Freq: Four times a day (QID) | INTRAMUSCULAR | Status: DC | PRN
  Administered 2014-06-27 – 2014-06-28 (×2): 4 mg via INTRAVENOUS
  Filled 2014-06-27 (×2): qty 2

## 2014-06-27 MED ORDER — PHENYLEPHRINE HCL 10 MG/ML IJ SOLN
10.0000 mg | INTRAVENOUS | Status: DC | PRN
  Administered 2014-06-27: 40 ug/min via INTRAVENOUS

## 2014-06-27 MED ORDER — MESALAMINE ER 250 MG PO CPCR
500.0000 mg | ORAL_CAPSULE | Freq: Every evening | ORAL | Status: DC
  Administered 2014-06-27 – 2014-06-30 (×4): 500 mg via ORAL
  Filled 2014-06-27 (×5): qty 2

## 2014-06-27 MED ORDER — MAGNESIUM HYDROXIDE 400 MG/5ML PO SUSP: 30.0000 mL | Freq: Every day | ORAL | Status: DC | PRN

## 2014-06-27 MED ORDER — BUTALBITAL-APAP-CAFFEINE 50-325-40 MG PO TABS
1.0000 | ORAL_TABLET | ORAL | Status: DC | PRN
  Administered 2014-06-27 – 2014-06-28 (×2): 1 via ORAL
  Filled 2014-06-27 (×2): qty 1

## 2014-06-27 MED ORDER — ALBUTEROL SULFATE (2.5 MG/3ML) 0.083% IN NEBU
2.5000 mg | INHALATION_SOLUTION | Freq: Four times a day (QID) | RESPIRATORY_TRACT | Status: DC | PRN
  Administered 2014-06-28 – 2014-07-01 (×3): 2.5 mg via RESPIRATORY_TRACT
  Filled 2014-06-27 (×4): qty 3

## 2014-06-27 MED ORDER — PROPOFOL 10 MG/ML IV BOLUS
INTRAVENOUS | Status: AC
Start: 2014-06-27 — End: 2014-06-27
  Filled 2014-06-27: qty 20

## 2014-06-27 MED ORDER — METOCLOPRAMIDE HCL 5 MG/ML IJ SOLN
5.0000 mg | Freq: Three times a day (TID) | INTRAMUSCULAR | Status: DC | PRN
Start: 2014-06-27 — End: 2014-07-01

## 2014-06-27 MED ORDER — HYDRALAZINE HCL 20 MG/ML IJ SOLN: 10.0000 mg | INTRAMUSCULAR | Status: DC | PRN

## 2014-06-27 MED ORDER — CEFAZOLIN SODIUM-DEXTROSE 2-3 GM-% IV SOLR
INTRAVENOUS | Status: DC | PRN
  Administered 2014-06-27: 2 g via INTRAVENOUS

## 2014-06-27 MED ORDER — HYDROCODONE-ACETAMINOPHEN 5-325 MG PO TABS
ORAL_TABLET | ORAL | Status: AC
  Administered 2014-06-27: 2 via ORAL
  Filled 2014-06-27: qty 2

## 2014-06-27 MED ORDER — DEXAMETHASONE SODIUM PHOSPHATE 4 MG/ML IJ SOLN
INTRAMUSCULAR | Status: DC | PRN
  Administered 2014-06-27: 4 mg via INTRAVENOUS

## 2014-06-27 MED ORDER — AMLODIPINE BESYLATE 2.5 MG PO TABS
2.5000 mg | ORAL_TABLET | Freq: Every day | ORAL | Status: DC
  Administered 2014-06-28 – 2014-07-01 (×4): 2.5 mg via ORAL
  Filled 2014-06-27 (×5): qty 1

## 2014-06-27 MED ORDER — ALBUTEROL SULFATE HFA 108 (90 BASE) MCG/ACT IN AERS: 2.0000 | INHALATION_SPRAY | Freq: Four times a day (QID) | RESPIRATORY_TRACT | Status: DC | PRN

## 2014-06-27 MED ORDER — OXYCODONE-ACETAMINOPHEN 5-325 MG PO TABS
1.0000 | ORAL_TABLET | Freq: Four times a day (QID) | ORAL | Status: DC | PRN
  Administered 2014-06-28: 2 via ORAL
  Filled 2014-06-27: qty 2

## 2014-06-27 MED ORDER — DOCUSATE SODIUM 100 MG PO CAPS
100.0000 mg | ORAL_CAPSULE | Freq: Two times a day (BID) | ORAL | Status: DC
  Administered 2014-06-27 – 2014-07-01 (×6): 100 mg via ORAL
  Filled 2014-06-27 (×10): qty 1

## 2014-06-27 MED ORDER — SCOPOLAMINE 1 MG/3DAYS TD PT72
MEDICATED_PATCH | TRANSDERMAL | Status: AC
  Filled 2014-06-27: qty 1

## 2014-06-27 MED ORDER — CHLORHEXIDINE GLUCONATE 4 % EX LIQD
4.0000 "application " | Freq: Once | CUTANEOUS | Status: DC
  Filled 2014-06-27: qty 60

## 2014-06-27 MED ORDER — SCOPOLAMINE 1 MG/3DAYS TD PT72
MEDICATED_PATCH | TRANSDERMAL | Status: DC | PRN
  Administered 2014-06-27: 1 via TRANSDERMAL

## 2014-06-27 MED ORDER — LIDOCAINE HCL (CARDIAC) 20 MG/ML IV SOLN
INTRAVENOUS | Status: DC | PRN
  Administered 2014-06-27: 100 mg via INTRAVENOUS

## 2014-06-27 MED ORDER — CLONIDINE HCL 0.1 MG PO TABS
0.1000 mg | ORAL_TABLET | Freq: Three times a day (TID) | ORAL | Status: DC | PRN
  Filled 2014-06-27: qty 1

## 2014-06-27 MED ORDER — ONDANSETRON HCL 4 MG/2ML IJ SOLN
INTRAMUSCULAR | Status: DC | PRN
  Administered 2014-06-27: 4 mg via INTRAVENOUS

## 2014-06-27 MED ORDER — ROSUVASTATIN CALCIUM 20 MG PO TABS
20.0000 mg | ORAL_TABLET | ORAL | Status: DC
  Administered 2014-06-29: 20 mg via ORAL
  Filled 2014-06-27: qty 1

## 2014-06-27 MED ORDER — ASPIRIN EC 81 MG PO TBEC
81.0000 mg | DELAYED_RELEASE_TABLET | Freq: Every day | ORAL | Status: DC
  Administered 2014-06-28 – 2014-07-01 (×4): 81 mg via ORAL
  Filled 2014-06-27 (×5): qty 1

## 2014-06-27 MED ORDER — ONDANSETRON HCL 4 MG/2ML IJ SOLN: 4.0000 mg | Freq: Once | INTRAMUSCULAR | Status: DC | PRN

## 2014-06-27 MED ORDER — PROPOFOL 10 MG/ML IV BOLUS
INTRAVENOUS | Status: DC | PRN
  Administered 2014-06-27: 150 mg via INTRAVENOUS
  Administered 2014-06-27: 30 mg via INTRAVENOUS
  Administered 2014-06-27: 20 mg via INTRAVENOUS

## 2014-06-27 MED ORDER — GLYCOPYRROLATE 0.2 MG/ML IJ SOLN
INTRAMUSCULAR | Status: AC
  Filled 2014-06-27: qty 1

## 2014-06-27 MED ORDER — HYDROMORPHONE HCL 1 MG/ML IJ SOLN
INTRAMUSCULAR | Status: AC
  Administered 2014-06-27: 0.5 mg via INTRAVENOUS
  Filled 2014-06-27: qty 2

## 2014-06-27 MED ORDER — FENTANYL CITRATE 0.05 MG/ML IJ SOLN
INTRAMUSCULAR | Status: AC
  Filled 2014-06-27: qty 5

## 2014-06-27 MED ORDER — PANTOPRAZOLE SODIUM 40 MG PO TBEC
40.0000 mg | DELAYED_RELEASE_TABLET | Freq: Every day | ORAL | Status: DC
  Administered 2014-06-28 – 2014-07-01 (×4): 40 mg via ORAL
  Filled 2014-06-27 (×4): qty 1

## 2014-06-27 MED ORDER — LORATADINE 10 MG PO TABS
10.0000 mg | ORAL_TABLET | Freq: Every day | ORAL | Status: DC
  Administered 2014-06-28 – 2014-07-01 (×4): 10 mg via ORAL
  Filled 2014-06-27 (×5): qty 1

## 2014-06-27 MED ORDER — LACTATED RINGERS IV SOLN
INTRAVENOUS | Status: DC
  Administered 2014-06-27: 09:00:00 via INTRAVENOUS

## 2014-06-27 SURGICAL SUPPLY — 74 items
BANDAGE ELASTIC 4 VELCRO ST LF (GAUZE/BANDAGES/DRESSINGS) ×2 IMPLANT
BANDAGE ELASTIC 6 VELCRO ST LF (GAUZE/BANDAGES/DRESSINGS) ×2 IMPLANT
BANDAGE ESMARK 6X9 LF (GAUZE/BANDAGES/DRESSINGS) ×1 IMPLANT
BIT DRILL 2.5X2.75 QC CALB (BIT) ×2 IMPLANT
BNDG COHESIVE 6X5 TAN STRL LF (GAUZE/BANDAGES/DRESSINGS) ×2 IMPLANT
BNDG ESMARK 6X9 LF (GAUZE/BANDAGES/DRESSINGS) ×2
BNDG GAUZE ELAST 4 BULKY (GAUZE/BANDAGES/DRESSINGS) ×2 IMPLANT
BRUSH SCRUB DISP (MISCELLANEOUS) ×4 IMPLANT
CLEANER TIP ELECTROSURG 2X2 (MISCELLANEOUS) ×2 IMPLANT
COVER SURGICAL LIGHT HANDLE (MISCELLANEOUS) ×4 IMPLANT
CUFF TOURNIQUET SINGLE 18IN (TOURNIQUET CUFF) IMPLANT
CUFF TOURNIQUET SINGLE 24IN (TOURNIQUET CUFF) IMPLANT
CUFF TOURNIQUET SINGLE 34IN LL (TOURNIQUET CUFF) IMPLANT
DRAPE C-ARM 42X72 X-RAY (DRAPES) IMPLANT
DRAPE C-ARMOR (DRAPES) ×2 IMPLANT
DRAPE U-SHAPE 47X51 STRL (DRAPES) ×2 IMPLANT
DRSG ADAPTIC 3X8 NADH LF (GAUZE/BANDAGES/DRESSINGS) ×2 IMPLANT
ELECT REM PT RETURN 9FT ADLT (ELECTROSURGICAL) ×2
ELECTRODE REM PT RTRN 9FT ADLT (ELECTROSURGICAL) ×1 IMPLANT
EVACUATOR 1/8 PVC DRAIN (DRAIN) IMPLANT
GAUZE SPONGE 4X4 12PLY STRL (GAUZE/BANDAGES/DRESSINGS) ×2 IMPLANT
GLOVE BIO SURGEON STRL SZ7.5 (GLOVE) ×2 IMPLANT
GLOVE BIO SURGEON STRL SZ8 (GLOVE) ×2 IMPLANT
GLOVE BIOGEL PI IND STRL 7.5 (GLOVE) ×1 IMPLANT
GLOVE BIOGEL PI IND STRL 8 (GLOVE) ×1 IMPLANT
GLOVE BIOGEL PI INDICATOR 7.5 (GLOVE) ×1
GLOVE BIOGEL PI INDICATOR 8 (GLOVE) ×1
GOWN STRL REUS W/ TWL LRG LVL3 (GOWN DISPOSABLE) ×2 IMPLANT
GOWN STRL REUS W/ TWL XL LVL3 (GOWN DISPOSABLE) ×1 IMPLANT
GOWN STRL REUS W/TWL LRG LVL3 (GOWN DISPOSABLE) ×2
GOWN STRL REUS W/TWL XL LVL3 (GOWN DISPOSABLE) ×1
HANDPIECE INTERPULSE COAX TIP (DISPOSABLE)
K-WIRE ACE 1.6X6 (WIRE) ×4
KIT BASIN OR (CUSTOM PROCEDURE TRAY) ×2 IMPLANT
KIT ROOM TURNOVER OR (KITS) ×2 IMPLANT
KWIRE ACE 1.6X6 (WIRE) ×2 IMPLANT
MANIFOLD NEPTUNE II (INSTRUMENTS) ×2 IMPLANT
NEEDLE 22X1 1/2 (OR ONLY) (NEEDLE) IMPLANT
NS IRRIG 1000ML POUR BTL (IV SOLUTION) ×2 IMPLANT
PACK ORTHO EXTREMITY (CUSTOM PROCEDURE TRAY) ×2 IMPLANT
PAD ARMBOARD 7.5X6 YLW CONV (MISCELLANEOUS) ×4 IMPLANT
PAD CAST 4YDX4 CTTN HI CHSV (CAST SUPPLIES) ×1 IMPLANT
PADDING CAST COTTON 4X4 STRL (CAST SUPPLIES) ×1
PADDING CAST COTTON 6X4 STRL (CAST SUPPLIES) ×6 IMPLANT
PLATE LOCK 3H 95 LT DIST FIB (Plate) ×2 IMPLANT
SCREW ACE CAN 4.0 36M (Screw) ×4 IMPLANT
SCREW LOCK CANC STAR 4X14 (Screw) ×8 IMPLANT
SCREW LOCK CANC STAR 4X16 (Screw) ×2 IMPLANT
SCREW LOCK CORT STAR 3.5X14 (Screw) ×2 IMPLANT
SCREW NLOCK CANC HEX 4X18 (Screw) ×1 IMPLANT
SCREW NLOCK CANC HEX 4X18 FIB (Screw) ×1 IMPLANT
SCREW NON LOCKING LP 3.5 14MM (Screw) ×4 IMPLANT
SET HNDPC FAN SPRY TIP SCT (DISPOSABLE) IMPLANT
SPLINT PLASTER CAST XFAST 5X30 (CAST SUPPLIES) ×1 IMPLANT
SPLINT PLASTER XFAST SET 5X30 (CAST SUPPLIES) ×1
SPONGE GAUZE 4X4 12PLY STER LF (GAUZE/BANDAGES/DRESSINGS) ×2 IMPLANT
SPONGE LAP 18X18 X RAY DECT (DISPOSABLE) ×2 IMPLANT
SPONGE SCRUB IODOPHOR (GAUZE/BANDAGES/DRESSINGS) ×2 IMPLANT
STAPLER VISISTAT 35W (STAPLE) IMPLANT
STOCKINETTE IMPERVIOUS LG (DRAPES) ×2 IMPLANT
STRIP CLOSURE SKIN 1/2X4 (GAUZE/BANDAGES/DRESSINGS) IMPLANT
SUCTION FRAZIER TIP 10 FR DISP (SUCTIONS) IMPLANT
SUT ETHILON 3 0 PS 1 (SUTURE) ×2 IMPLANT
SUT VIC AB 0 CT1 27 (SUTURE)
SUT VIC AB 0 CT1 27XBRD ANBCTR (SUTURE) IMPLANT
SUT VIC AB 2-0 CT1 27 (SUTURE) ×1
SUT VIC AB 2-0 CT1 TAPERPNT 27 (SUTURE) ×1 IMPLANT
SYR CONTROL 10ML LL (SYRINGE) IMPLANT
TOWEL OR 17X24 6PK STRL BLUE (TOWEL DISPOSABLE) ×4 IMPLANT
TOWEL OR 17X26 10 PK STRL BLUE (TOWEL DISPOSABLE) ×4 IMPLANT
TUBE CONNECTING 12X1/4 (SUCTIONS) ×2 IMPLANT
UNDERPAD 30X30 INCONTINENT (UNDERPADS AND DIAPERS) ×2 IMPLANT
WATER STERILE IRR 1000ML POUR (IV SOLUTION) ×4 IMPLANT
YANKAUER SUCT BULB TIP NO VENT (SUCTIONS) ×2 IMPLANT

## 2014-06-27 NOTE — Anesthesia Postprocedure Evaluation (Signed)
  Anesthesia Post-op Note  Patient: Julia [name]  Procedure(s) Performed: Procedure(s): ORIF of trimalleolar fracture, without fixation of the posterior lip  (Left)  Patient Location: PACU  Anesthesia Type:General  Level of Consciousness: awake, alert , oriented and patient cooperative  Airway and Oxygen Therapy: Patient Spontanous Breathing  Post-op Pain: mild  Post-op Assessment: Post-op Vital signs reviewed, Patient's Cardiovascular Status Stable, Respiratory Function Stable, Patent Airway, No signs of Nausea or vomiting and Pain level controlled  Post-op Vital Signs: stable  Last Vitals:  Filed Vitals:   06/27/14 1308  BP: 138/96  Pulse: 62  Temp: 36.5 C  Resp: 14    Complications: No apparent anesthesia complications

## 2014-06-27 NOTE — Brief Op Note (Signed)
06/26/2014 - 06/27/2014  11:52 AM  PATIENT:  Julia [name]  63 y.o. female  PRE-OPERATIVE DIAGNOSIS:  LEFT ANKLE TRIMALLEOLAR FRACTURE  POST-OPERATIVE DIAGNOSIS:  LEFT ANKLE TRIMALLEOLAR FRACTURE  PROCEDURE:  Procedure(s): 1. ORIF of trimalleolar fracture, without fixation of the posterior lip  (Left) 2. STRESS FLOUROSCOPY OF LEFT ANKLE SYNDESMOSIS  SURGEON:  Surgeon(s) and Role:    * Rozanna Box, MD - Primary  PHYSICIAN ASSISTANT: None  ANESTHESIA:   general  I/O:  Total I/O In: 1800 [I.V.:1800] Out: 20 [Blood:20]  TOURNIQUET:  * No tourniquets in log *  DICTATION: .Other Dictation: Dictation Number [id number]

## 2014-06-27 NOTE — Op Note (Signed)
NAMEELGENE, [name]             ACCOUNT NO.:  [id number]  MEDICAL RECORD NO.:  [id number]  LOCATION:  [facility]                         FACILITY:  [facility]  PHYSICIAN:  Astrid Divine. Marcelino Scot, M.D. DATE OF BIRTH:  21-Aug-1951  DATE OF PROCEDURE:  06/27/2014 DATE OF DISCHARGE:                              OPERATIVE REPORT   PREOPERATIVE DIAGNOSIS:  Left trimalleolar ankle fracture status post reduction of dislocation.  POSTOPERATIVE DIAGNOSIS:  Left trimalleolar ankle fracture status post reduction of dislocation.  PROCEDURE: 1. Open reduction and internal fixation of left trimalleolar ankle     fracture without fixation of the posterior lip. 2. Stress fluoroscopy of the left ankle syndesmosis.  SURGEON:  Astrid Divine. Marcelino Scot, MD  ASSISTANT:  None.  ANESTHESIA:  General.  COMPLICATIONS:  None.  TOURNIQUET:  Note.  DISPOSITION:  To PACU.  CONDITION:  Stable.  BRIEF SUMMARY AND INDICATIONS FOR PROCEDURE:  Julia [name] is a very pleasant 63 year old female who sustained a fall from several steps resulting in a rather severe fracture dislocation of the left ankle. This was treated with closed reduction by Dr. Lars Pinks and we were consulted then for definitive management.  The patient's talus remained laterally subluxed.  I discussed with her and her husband the likelihood and the need for external fixation in the event of finding significant soft tissue blistering or skin compromise.  We also discussed potential for definitive internal fixation  should the soft tissues allow.  She understood the complications to include heart attack, stroke, nerve injury, vessel injury, DVT, PE, loss of motion, arthritis, malunion, nonunion, symptomatic hardware, infection and many others and did wish to proceed.  BRIEF SUMMARY OF PROCEDURE:  Julia [name] received preoperative antibiotics consisting of a test dose of Ancef because of an allergy to penicillin which produced a rash.  The Ancef did  not produce rash or any reaction of any kind and was well tolerated.  Consequently, the full 2 g dose was administered.  The left lower extremity was prepped and draped in usual sterile fashion while maintaining reduction of the ankle using the Quigley maneuver.  The lateral malleolus was approached through a 7- cm incision.  The superficial peroneal nerve was identified and retracted anteriorly.  The comminuted fibula was teased together and held with a lobster claw tenaculum and then had a coronal split over the interdigitated compressed fracture.  We then slid the anatomic fibular plate from [employer].  Secured standard fixation proximally to medialize the talus and then placed multiple screws of fixation distal to the fracture first with standard cancellous and then with locked cancellous and cortical mixed so that the plate would first be proximally appose the bone and then secure this in compressed position.  Medially, a curvilinear incision was made allowing full access to the joint visualization. There was an area of full-thickness cartilage loss which was about 3.5 mm at its widest anterior medial edge and then tapered back over 4 mm to a very thin tail.  This was cartilage only was not reconstructible.  The remainder of the cartilage surface appeared well preserved of the tibia and the talar body did not have any areas of full thickness loss.  The joint was  distracted, irrigated thoroughly, cleared of all hematoma and debris.  The fracture edge was cleaned with a curette.  A small pilot hole was placed in the tibia to allow for use of the tenaculum clamp and then it was compressed into position.  This produced a very nice interdigitation with compression and was secured with 2 cannulated screws 36 mm in length.  The screws were checked for position on multiple views as was the reduction.  Then, under live fluoro an external rest stress view of the syndesmosis was obtained which  showed no instability with no medial clear space expansion and no gapping to the syndesmotic interval.  Wounds were irrigated thoroughly and closed in standard layered fashion using 2-0 Vicryl and 3-0 nylon. Sterile gently compressive dressing was applied, and then a posterior stirrup splint.  The patient was awakened from anesthesia and transferred to PACU in stable condition.  No tourniquet was used during the procedure.  PROGNOSIS:  Julia [name] will be nonweightbearing on the left lower extremity with unrestricted range of motion of the knee and toes.  We will plan to see her back in the office in 10-14 days for removal of her cast and sutures.  She is at increased risk for arthritis but this may not be clinically a factor given her current level of demand.  Also we will pursue a bone metabolic assessment to make sure that these conditions are optimized as the bone did seem soft.     Astrid Divine. Marcelino Scot, M.D.     MHH/MEDQ  D:  06/27/2014  T:  06/27/2014  Job:  [id number]

## 2014-06-27 NOTE — Transfer of Care (Signed)
Immediate Anesthesia Transfer of Care Note  Patient: Julia [name]  Procedure(s) Performed: Procedure(s): ORIF of trimalleolar fracture, without fixation of the posterior lip  (Left)  Patient Location: PACU  Anesthesia Type:General  Level of Consciousness: awake  Airway & Oxygen Therapy: Patient Spontanous Breathing and Patient connected to nasal cannula oxygen  Post-op Assessment: Report given to PACU RN and Post -op Vital signs reviewed and stable  Post vital signs: Reviewed and stable  Complications: No apparent anesthesia complications

## 2014-06-27 NOTE — Progress Notes (Signed)
Small acute avulsion fracture off tip of lateral malleolus on the right, but not unstable and will not require surgical repair. WBAT without bracing, add stirrup if helpful for ambulation by providing pain relief.  No other changes.  Proceeding to surgery for left ankle.  Altamese Washta, MD [facility] [phone] (p)

## 2014-06-27 NOTE — Anesthesia Preprocedure Evaluation (Addendum)
Anesthesia Evaluation  Patient identified by MRN, date of birth, ID band Patient awake    Reviewed: Allergy & Precautions, H&P , NPO status , Patient's Chart, lab work & pertinent test results  Airway       Dental   Pulmonary asthma , sleep apnea , former smoker,          Cardiovascular hypertension, + Peripheral Vascular Disease     Neuro/Psych  Headaches, Seizures -,   Neuromuscular disease CVA, Residual Symptoms    GI/Hepatic GERD-  ,  Endo/Other    Renal/GU      Musculoskeletal  (+) Arthritis -,   Abdominal   Peds  Hematology   Anesthesia Other Findings Crohn's Sarcoid  Reproductive/Obstetrics                          Anesthesia Physical Anesthesia Plan  ASA: III  Anesthesia Plan: General   Post-op Pain Management:    Induction: Intravenous  Airway Management Planned: LMA  Additional Equipment:   Intra-op Plan:   Post-operative Plan: Extubation in OR  Informed Consent:   Plan Discussed with: Anesthesiologist, CRNA and Surgeon  Anesthesia Plan Comments:         Anesthesia Quick Evaluation

## 2014-06-28 DIAGNOSIS — S82852A Displaced trimalleolar fracture of left lower leg, initial encounter for closed fracture: Secondary | ICD-10-CM | POA: Diagnosis not present

## 2014-06-28 MED ORDER — OXYCODONE HCL 5 MG PO TABS
5.0000 mg | ORAL_TABLET | ORAL | Status: DC | PRN
  Administered 2014-06-28 – 2014-06-29 (×5): 10 mg via ORAL
  Administered 2014-06-29: 15 mg via ORAL
  Administered 2014-06-29: 10 mg via ORAL
  Filled 2014-06-28 (×3): qty 2
  Filled 2014-06-28: qty 3
  Filled 2014-06-28 (×4): qty 2

## 2014-06-28 MED ORDER — HYDROCODONE-ACETAMINOPHEN 7.5-325 MG PO TABS
1.0000 | ORAL_TABLET | Freq: Four times a day (QID) | ORAL | Status: DC | PRN
  Administered 2014-06-30: 1 via ORAL
  Administered 2014-06-30 – 2014-07-01 (×2): 2 via ORAL
  Filled 2014-06-28: qty 2
  Filled 2014-06-28: qty 1
  Filled 2014-06-28: qty 2

## 2014-06-28 MED ORDER — ALBUTEROL SULFATE HFA 108 (90 BASE) MCG/ACT IN AERS: 1.0000 | INHALATION_SPRAY | Freq: Four times a day (QID) | RESPIRATORY_TRACT | Status: DC

## 2014-06-28 MED ORDER — ALBUTEROL SULFATE HFA 108 (90 BASE) MCG/ACT IN AERS: 1.0000 | INHALATION_SPRAY | Freq: Four times a day (QID) | RESPIRATORY_TRACT | Status: DC | PRN

## 2014-06-28 MED ORDER — PREGABALIN 75 MG PO CAPS
75.0000 mg | ORAL_CAPSULE | Freq: Two times a day (BID) | ORAL | Status: DC
  Administered 2014-06-28 – 2014-07-01 (×7): 75 mg via ORAL
  Filled 2014-06-28 (×7): qty 1

## 2014-06-28 NOTE — Progress Notes (Signed)
Patient informed RN that she feels like "there is an elephant on her chest and that she feels like she is filling up inside". RN assessed patient lung bases clear and diminished, 98% on 2 liters O2, patient states that she has no sharp pains in her chest, patient states that she is not short of breath. RN encouraged patient to use incentive spirometer. Patient was able to get the meter reading to 700. Patient stated that the incentive spirometer gave her some relief but she prefers the ventolin inhaler. RN called the answering service for her attending provider Dr. Marcelino Scot. Waiting on a return call update. Nursing will continue to monitor.

## 2014-06-28 NOTE — Progress Notes (Signed)
Utilization Review Completed.   Brya Simerly, RN, BSN Nurse Case Manager  

## 2014-06-28 NOTE — Plan of Care (Signed)
Problem: Acute Rehab PT Goals(only PT should resolve) Goal: Pt Will Transfer Bed To Chair/Chair To Bed On RW Goal: Pt Will Ambulate On Level surfaces

## 2014-06-28 NOTE — Evaluation (Signed)
Occupational Therapy Evaluation Patient Details Name: Julia [name] MRN: [id number] DOB: Oct 07, 1950 Today's Date: 06/28/2014    History of Present Illness 63 y.o. female admitted after fallind down steps and sustained Lt ankle trimalleolar fracture - unerwent ORIF; also sustained small avulsion fracture of tip of lateral mallelolus on the Rt.  Non surgical repair and to be WBAT.  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 She demonstrates the below listed deficits and will benefit from continued OT to maximize safety and independence with BADLs.  Pt requires mod - max A overall with BADLs.  Currently, her husband is unable to assist her physically with mobility as he has myositis and is at risk for falls.  He can assist her with non standing/ambulatory activities.  They live in [place], [place], but their daughter lives locally and can possibly provide 24 hour physical assistance.  Pt's spouse would like to avoid SNF level rehab as they have had unpleasant experiences with other family members, and would prefer CIR.  Anticipate, pt will achieve mod I level with CIR if it is an option.       Follow Up Recommendations  CIR;Supervision/Assistance - 24 hour    Equipment Recommendations  None recommended by OT    Recommendations for Other Services       Precautions / Restrictions Precautions Precautions: Fall Restrictions Weight Bearing Restrictions: Yes RLE Weight Bearing: Weight bearing as tolerated LLE Weight Bearing: Non weight bearing Other Position/Activity Restrictions: NWB      Mobility Bed Mobility Overal bed mobility: Needs Assistance Bed Mobility: Supine to Sit     Supine to sit: Min assist     General bed mobility comments: Assist mainly under her RLE at splint  Transfers Overall transfer level: Needs  assistance Equipment used: Rolling walker (2 wheeled) Transfers: Sit to/from Stand Sit to Stand: Mod assist Stand pivot transfers: Mod assist       General transfer comment: Assist to steady at walker with cues for hand placement and for controlling walker    Balance Overall balance assessment: Needs assistance Sitting-balance support: Single extremity supported;Bilateral upper extremity supported Sitting balance-Leahy Scale: Fair   Postural control: Posterior lean Standing balance support: Bilateral upper extremity supported Standing balance-Leahy Scale: Poor                              ADL Overall ADL's : Needs assistance/impaired Eating/Feeding: Modified independent;Sitting   Grooming: Wash/dry hands;Wash/dry face;Oral care;Brushing hair;Applying deodorant;Set up;Sitting   Upper Body Bathing: Set up;Sitting   Lower Body Bathing: Maximal assistance;Sit to/from stand   Upper Body Dressing : Minimal assistance;Sitting   Lower Body Dressing: Total assistance;Sit to/from stand   Toilet Transfer: Moderate assistance;Stand-pivot;RW;BSC   Toileting- Clothing Manipulation and Hygiene: Total assistance;Sit to/from stand       Functional mobility during ADLs: Moderate assistance General ADL Comments: Spoke at length with pt and spouse re: expectations and rehab options.  Spouse with many questions as they do not live in [place]     Vision                 Additional Comments: Spouse originally reports pt with impaired peripheral vision both Rt and Lt fields.  Confrontation reveals dense Lt homonomous hemianopsia   Perception     Praxis  Pertinent Vitals/Pain Pain Assessment: 0-10 Pain Score: 3  Pain Location: bil. ankles Pain Intervention(s): Monitored during session     Hand Dominance Right   Extremity/Trunk Assessment Upper Extremity Assessment Upper Extremity Assessment: LUE deficits/detail LUE Deficits / Details: Pt with limited shoulder  AROM ~100 degrees due to previous CVA   Lower Extremity Assessment Lower Extremity Assessment: Defer to PT evaluation   Cervical / Trunk Assessment Cervical / Trunk Assessment: Normal   Communication Communication Communication: No difficulties   Cognition Arousal/Alertness: Lethargic Behavior During Therapy: WFL for tasks assessed/performed Overall Cognitive Status: Within Functional Limits for tasks assessed                     General Comments       Exercises       Shoulder Instructions      Home Living Family/patient expects to be discharged to:: Unsure                                 Additional Comments: Spouse has myositis and uses a SPC.  He is unable to physically assist her with gait.  They live in [place] and were visiting daughter and grandchildren when fall occurred.   Spouse is unsure about SNF (pt's mother is in SNF, and they prefer to avoid SNF if possible).  Options will be d/c to SNF or to daughter's home with daughter assisting.  Pt's spouse to discuss options with family       Prior Functioning/Environment Level of Independence: Independent        Comments: Pt required assist in the community due to homonymous hemianopsia.  However, pt and spouse report that she does drive sometimes (unsure if she has been approved to drive with her visual deficits)    OT Diagnosis: Generalized weakness;Disturbance of vision;Acute pain   OT Problem List: Decreased strength;Decreased activity tolerance;Impaired balance (sitting and/or standing);Impaired vision/perception;Decreased knowledge of use of DME or AE;Decreased knowledge of precautions;Impaired UE functional use;Pain   OT Treatment/Interventions: Self-care/ADL training;DME and/or AE instruction;Therapeutic activities;Patient/family education;Balance training    OT Goals(Current goals can be found in the care plan section) Acute Rehab OT Goals Patient Stated Goal: to get better OT Goal  Formulation: With patient/family Time For Goal Achievement: 07/05/14 Potential to Achieve Goals: Good ADL Goals Pt Will Perform Grooming: with min assist;standing Pt Will Perform Lower Body Bathing: with min assist;with adaptive equipment;sit to/from stand Pt Will Perform Lower Body Dressing: with min assist;with adaptive equipment;sit to/from stand Pt Will Transfer to Toilet: with min assist;ambulating;bedside commode;grab bars Pt Will Perform Toileting - Clothing Manipulation and hygiene: with min assist;sit to/from stand  OT Frequency: Min 2X/week   Barriers to D/C: Decreased caregiver support  daughter may be able to provide necessary assist       Co-evaluation              End of Session    Activity Tolerance: Patient limited by lethargy;Patient limited by fatigue Patient left: in chair;with call bell/phone within reach;with family/visitor present   Time: 5188-4166 OT Time Calculation (min): 43 min Charges:  OT General Charges $OT Visit: 1 Procedure OT Evaluation $Initial OT Evaluation Tier I: 1 Procedure OT Treatments $Self Care/Home Management : 23-37 mins G-Codes: OT G-codes **NOT FOR INPATIENT CLASS** Functional Limitation: Self care Self Care Current Status (A6301): At least 40 percent but less than 60 percent impaired, limited or restricted Self Care Goal Status (  G8988): At least 1 percent but less than 20 percent impaired, limited or restricted  Kessler Solly M 06/28/2014, 5:29 PM

## 2014-06-28 NOTE — Evaluation (Signed)
Physical Therapy Evaluation Patient Details Name: Julia [name] MRN: [id number] DOB: July 08, 1951 Today's Date: 06/28/2014   History of Present Illness  63 yo female with ORIF after R trimalleolar fracture, NWB.    Clinical Impression  Pt was seen with husband present to discuss her limits during gait in therapy evaluation today.  Her plan is to go to SNF for further strengthening as her husband cannot help her due to being disabled himself.  Will follow until dc.    Follow Up Recommendations Supervision/Assistance - 24 hour;SNF    Equipment Recommendations  Rolling walker with 5" wheels    Recommendations for Other Services       Precautions / Restrictions Restrictions Weight Bearing Restrictions: Yes Other Position/Activity Restrictions: NWB      Mobility  Bed Mobility Overal bed mobility: Needs Assistance Bed Mobility: Supine to Sit     Supine to sit: Min assist     General bed mobility comments: Assist mainly under her RLE at splint  Transfers Overall transfer level: Needs assistance Equipment used: Rolling walker (2 wheeled) Transfers: Sit to/from [facility] Sit to Stand: Mod assist Stand pivot transfers: Mod assist       General transfer comment: Assist to steady at walker with cues for hand placement and for controlling walker  Ambulation/Gait Ambulation/Gait assistance: Mod assist (second person for chair) Ambulation Distance (Feet): 12 Feet Assistive device: Rolling walker (2 wheeled) Gait Pattern/deviations:  (hopping on RW with pushing through of walker to maintain bal) Gait velocity: slow and halting to step Gait velocity interpretation: Below normal speed for age/gender General Gait Details: Hopping on LLE  Stairs            Wheelchair Mobility    Modified Rankin (Stroke Patients Only)       Balance Overall balance assessment: Needs assistance Sitting-balance support: Single extremity supported;Bilateral upper  extremity supported Sitting balance-Leahy Scale: Fair   Postural control: Posterior lean Standing balance support: Bilateral upper extremity supported Standing balance-Leahy Scale: Poor                               Pertinent Vitals/Pain Pain Assessment: 0-10 Pain Score: 7  Pain Location: R ankle during gait Pain Intervention(s): Limited activity within patient's tolerance;Monitored during session;Premedicated before session;Repositioned    Home Living Family/patient expects to be discharged to:: Skilled nursing facility                 Additional Comments: pt has ramp and steps with no rails to enter house    Prior Function Level of Independence: Independent               Hand Dominance   Dominant Hand: Right    Extremity/Trunk Assessment   Upper Extremity Assessment: Overall WFL for tasks assessed           Lower Extremity Assessment: Overall WFL for tasks assessed      Cervical / Trunk Assessment: Normal  Communication   Communication: No difficulties  Cognition Arousal/Alertness: Awake/alert Behavior During Therapy: WFL for tasks assessed/performed Overall Cognitive Status: Within Functional Limits for tasks assessed                      General Comments General comments (skin integrity, edema, etc.): Has splint on RLE elevated in bed and then in chair after walking with PT    Exercises        Assessment/Plan  PT Assessment Patient needs continued PT services  PT Diagnosis Difficulty walking   PT Problem List Decreased activity tolerance;Decreased balance;Decreased mobility;Decreased coordination;Decreased knowledge of use of DME;Decreased skin integrity;Pain  PT Treatment Interventions DME instruction;Gait training;Functional mobility training;Therapeutic activities;Therapeutic exercise;Balance training;Neuromuscular re-education;Patient/family education   PT Goals (Current goals can be found in the Care Plan  section) Acute Rehab PT Goals Patient Stated Goal: To be safe walking PT Goal Formulation: With patient/family Time For Goal Achievement: 07/12/14 Potential to Achieve Goals: Good    Frequency Min 3X/week   Barriers to discharge Inaccessible home environment;Decreased caregiver support Husband reports he cannot help her as he is on a cane    Co-evaluation               End of Session Equipment Utilized During Treatment: Gait belt Activity Tolerance: Patient tolerated treatment well;Patient limited by fatigue Patient left: in chair;with call bell/phone within reach;with family/visitor present Nurse Communication: Mobility status    Functional Assessment Tool Used: clinical observation Functional Limitation: Mobility: Walking and moving around Mobility: Walking and Moving Around Current Status (I9518): At least 40 percent but less than 60 percent impaired, limited or restricted Mobility: Walking and Moving Around Goal Status [id number]): At least 1 percent but less than 20 percent impaired, limited or restricted    Time: 1305-1336 PT Time Calculation (min): 31 min   Charges:   PT Evaluation $Initial PT Evaluation Tier I: 1 Procedure PT Treatments $Gait Training: 8-22 mins   PT G Codes:   Functional Assessment Tool Used: clinical observation Functional Limitation: Mobility: Walking and moving around    Ramond Dial 06/28/2014, 2:26 PM Mee Hives, PT MS Acute Rehab Dept. Number: [phone]

## 2014-06-28 NOTE — Progress Notes (Signed)
Orthopaedic Trauma Service Progress Note  Subjective  C/o moderate pain Left ankle and foot occasional shooting and sharp pain  Had some chest tightness earlier this am but resolved with neb treatment Denies chest pain, numbness/tingling in L arm and jaw   Has not been out of bed yet No appetite + Sore throat as well  Was nauseated earlier but not at current time   Review of Systems  Constitutional: Negative for fever and chills.  HENT: Positive for sore throat.   Respiratory: Negative for shortness of breath and wheezing.   Cardiovascular: Negative for chest pain and palpitations.  Gastrointestinal: Negative for nausea.  Genitourinary: Negative for dysuria.  Neurological: Positive for tingling.     Objective   BP 138/81  Pulse 66  Temp(Src) 99.2 F (37.3 C) (Oral)  Resp 14  Ht 5' 2"  (1.575 m)  Wt 71.668 kg (158 lb)  BMI 28.89 kg/m2  SpO2 97%  Intake/Output     10/17 0701 - 10/18 0700 10/18 0701 - 10/19 0700   P.O. 0 60   I.V. (mL/kg) 1800 (25.1)    Total Intake(mL/kg) 1800 (25.1) 60 (0.8)   Urine (mL/kg/hr)     Blood 20 (0)    Total Output 20     Net +1780 +60        Urine Occurrence 2 x      Labs  Vitamin D panel pending    Exam  Gen: awake and alert, resting in bed NAD Lungs: clear, no wheezes or rhonchi  Cardiac: RRR, s1 and s2 Abd: + BS, NTND Ext:       Left Lower Extremity   Splint c/d/i  Ext warm  EHL, FHL, lesser toe motor intact  DPN, SPN, TN sensation intact  Ext warm  Swelling controlled   No pain with passive stretch     Assessment and Plan   POD/HD#: 1  1. L trimalleolar ankle fracture dislocation s/p ORIF   NWB x 6-8 weeks  Ice and elevate  Splint x 2 weeks  Toe and knee motion as tolerated  PT/OT evals  Walker or crutches   2. Pain management:  Will adjust pain meds  Will change to norco from percocet   + lyrica as well   3. ABL anemia/Hemodynamics  Hemodynamics stable  Continue to monitor   4. Medical  issues   HTN   Stable, continue home meds   Asthma   Improved with neb tx    Continue with IS q 1h while awake   Start to mobilize     5. DVT/PE prophylaxis:  Lovenox x 21 days  6. ID:   Completed periop abx  7. Metabolic Bone Disease:  Vitamin D panel pending  Will need dexa as outpt  Low energy mechanism for fractures suspect baseline osteopenia/osteoporosis   Will start on calcium and vitamin d   8. Activity:  Up as tolerated while maintaining NWB on L leg   PT/OT evals  9. FEN/Foley/Lines:  Advance diet as tolerated  Continue with IVF   10.Ex-fix/Splint care:  Keep splint clean and dry  Do not remove splint   11. Impediments to fracture healing:  Postmenopausal  Suspect osteoporosis/osteopenia base off mechanism  12. Dispo:  PT/OT evals  Continue inpt treatment  Anticipate dc tomorrow  HH consult for DME, [place] PT     Jari Pigg, PA-C [facility] [phone] (O) 06/28/2014 9:59 AM

## 2014-06-29 DIAGNOSIS — S82852A Displaced trimalleolar fracture of left lower leg, initial encounter for closed fracture: Secondary | ICD-10-CM | POA: Diagnosis not present

## 2014-06-29 DIAGNOSIS — S82892S Other fracture of left lower leg, sequela: Secondary | ICD-10-CM

## 2014-06-29 LAB — VITAMIN D 25 HYDROXY (VIT D DEFICIENCY, FRACTURES): Vit D, 25-Hydroxy: 35 ng/mL (ref 30–89)

## 2014-06-29 NOTE — Progress Notes (Signed)
Orthopaedic Trauma Service Progress Note  Subjective  Doing well Pain much improved  Tolerating diet  Would like to go to inpatient rehab   Review of Systems  Constitutional: Negative for fever and chills.  Respiratory: Negative for shortness of breath and wheezing.   Cardiovascular: Negative for chest pain and palpitations.  Gastrointestinal: Negative for nausea, vomiting and abdominal pain.  Genitourinary: Negative for dysuria.  Neurological: Negative for headaches.     Objective   BP 131/85  Pulse 73  Temp(Src) 98.2 F (36.8 C) (Oral)  Resp 14  Ht 5' 2"  (1.575 m)  Wt 71.668 kg (158 lb)  BMI 28.89 kg/m2  SpO2 98%  Intake/Output     10/18 0701 - 10/19 0700 10/19 0701 - 10/20 0700   P.O. 300 240   I.V. (mL/kg)     Total Intake(mL/kg) 300 (4.2) 240 (3.3)   Blood     Total Output       Net +300 +240        Urine Occurrence 1 x      Labs  Vitamin D panel pending   Exam  Gen: awake and alert, sitting in bedside chair  Lungs: clear, no wheezes or rhonchi   Cardiac: RRR, s1 and s2 Abd: + BS, NTND Ext:        Left Lower Extremity               Splint c/d/i             Ext warm             EHL, FHL, lesser toe motor intact             DPN, SPN, TN sensation intact             Ext warm             Swelling controlled               No pain with passive stretch     Assessment and Plan   POD/HD#: 2  1. L trimalleolar ankle fracture dislocation s/p ORIF               NWB x 6-8 weeks             Ice and elevate             Splint x 2 weeks             Toe and knee motion as tolerated             PT/OT              Walker or crutches    CIR consult   SW for SNF   2. Pain management:             improved with current regimen    3. ABL anemia/Hemodynamics             Hemodynamics stable             Continue to monitor   4. Medical issues               HTN                         Stable, continue home meds              Asthma          no additional issues yesterday  Continue with IS q 1h while awake                         mobilize                            5. DVT/PE prophylaxis:             Lovenox x 21 days  6. ID:               Completed periop abx  7. Metabolic Bone Disease:             Vitamin D panel pending             Will need dexa as outpt             Low energy mechanism for fractures suspect baseline osteopenia/osteoporosis               Will start on calcium and vitamin d   8. Activity:             Up as tolerated while maintaining NWB on L leg               PT/OT   9. FEN/Foley/Lines:             Advance diet as tolerated            NSL IV    10.Ex-fix/Splint care:             Keep splint clean and dry             Do not remove splint              11. Impediments to fracture healing:             Postmenopausal             Suspect osteoporosis/osteopenia base off mechanism  12. Dispo:             PT/OT              Continue inpt treatment            CIR eval    Jari Pigg, PA-C [facility] [phone] (O) 06/29/2014 9:17 AM

## 2014-06-29 NOTE — Progress Notes (Signed)
Occupational Therapy Treatment Patient Details Name: Julia [name] MRN: [id number] DOB: 11-Jan-1951 Today's Date: 06/29/2014    History of present illness 63 yo female with ORIF after R trimalleolar fracture, NWB.     OT comments  This 63 yo female making progress with main focus today on tolieting transfers and grooming and then also added the use of a knee walker. Will continue to benefit from acute OT with follow up at CIR  Follow Up Recommendations  CIR;Supervision/Assistance - 24 hour    Equipment Recommendations   (knee walker)       Precautions / Restrictions Precautions Precautions: Fall Restrictions Weight Bearing Restrictions: Yes LLE Weight Bearing: Non weight bearing       Mobility Bed Mobility               General bed mobility comments: Pt up in recliner upon arrival  Transfers Overall transfer level: Needs assistance Equipment used: Rolling walker (2 wheeled) Transfers: Sit to/from Stand Sit to Stand: Min assist         General transfer comment: Also had pt try and use the knee walker for the first time today--pt min A but says this is much easier than the RW    Balance Overall balance assessment: Needs assistance Sitting-balance support: No upper extremity supported;Feet supported Sitting balance-Leahy Scale: Good     Standing balance support: Bilateral upper extremity supported Standing balance-Leahy Scale: Poor                     ADL Overall ADL's : Needs assistance/impaired     Grooming: Wash/dry hands;Oral care;Min guard;Standing                   Toilet Transfer: Minimal assistance;Ambulation;RW;BSC (over toilet)   Toileting- Clothing Manipulation and Hygiene: Modified independent;Sitting/lateral lean                          Cognition   Behavior During Therapy: WFL for tasks assessed/performed Overall Cognitive Status: Within Functional Limits for tasks assessed                                     Pertinent Vitals/ Pain       Pain Assessment: 0-10 Pain Score: 4  Pain Location: right ankle when going from an elevated position to dependent position Pain Descriptors / Indicators: Aching;Throbbing Pain Intervention(s): Limited activity within patient's tolerance;Monitored during session;Repositioned         Frequency Min 2X/week     Progress Toward Goals  OT Goals(current goals can now be found in the care plan section)  Progress towards OT goals: Progressing toward goals     Plan Discharge plan remains appropriate       End of Session Equipment Utilized During Treatment: Gait belt;Rolling walker (knee walker)   Activity Tolerance Patient tolerated treatment well   Patient Left in chair;with call bell/phone within reach;with family/visitor present           Time: 1022-1049 OT Time Calculation (min): 27 min  Charges: OT General Charges $OT Visit: 1 Procedure OT Treatments $Self Care/Home Management : 23-37 mins  Almon Register [phone] 06/29/2014, 2:06 PM

## 2014-06-29 NOTE — Consult Note (Signed)
Physical Medicine and Rehabilitation Consult  Reason for Consult: Trimalleolar left ankle fracture/dislocation Referring Physician: Dr. Marcelino Scot   HPI: Julia [name] is a 63 y.o. female with history of CVA with residual L-HH, sarcoidosis, chronic dizziness, HTN, who tripped and fell while walking up stairs on 06/26/14. She twisted her ankle, heard a pop and fell onto her back side with complaints of severe ankle pain. She was as evaluated in ED and found to have left ankle trimalleolar fracture dislocation of left ankle and was closed  reduced but with persistent subluxation and angulation and surgical intervention recommended. Right ankle with avulsion fracture of tip of lateral malleolus and WBAT without need for bracing. On 06/27/14, she underwent ORIF left ankle fracture by Dr. Marcelino Scot and is to be NWB for 6-8 weeks. Therapy ongoing and CIR recommended by MD and rehab team.    Review of Systems  HENT: Negative for hearing loss.   Eyes: Negative for blurred vision and double vision.       Left visual field deficits  Respiratory: Negative for cough and shortness of breath.   Cardiovascular: Negative for chest pain and palpitations.  Gastrointestinal: Negative for heartburn and nausea.  Musculoskeletal: Positive for myalgias (right shoulder discomfort due to use of walker. ).  Neurological: Positive for dizziness (occasionally). Negative for headaches.  Psychiatric/Behavioral: The patient has insomnia. The patient is not nervous/anxious.     Past Medical History  Diagnosis Date  . Anal fissure 04/29/2008  . BACK PAIN, LUMBAR, WITH RADICULOPATHY 09/21/2008  . BREAST CYSTS, BILATERAL 10/01/2007  . Carpal tunnel syndrome   . CEREBRAL ANEURYSM   . CHEST DISCOMFORT   . CHOLELITHIASIS   . COLITIS   . COMMON MIGRAINE   . CONSTIPATION   . Cough variant asthma   . CROHN'S DISEASE   . DEGENERATIVE DISC DISEASE, CERVICAL SPINE   . Diarrhea   . DIZZINESS, CHRONIC   . FACIAL  PARESTHESIA, LEFT   . GERD   . HIATAL HERNIA   . HYPERLIPIDEMIA, BORDERLINE   . HYPERTENSION   . ORGANIC INSOMNIA UNSPECIFIED   . Sarcoidosis   . SLEEP APNEA   . UNSPECIFIED VENOUS INSUFFICIENCY   . CVA (cerebrovascular accident due to intracerebral hemorrhage)    Past Surgical History  Procedure Laterality Date  . Breast lumpectomy    . Colectomy  1981    right  . Carpal tunnel release    . Abdominal hysterectomy    . Tonsillectomy     Family History  Problem Relation Age of Onset  . Stomach cancer Father   . Esophageal cancer Father   . Colon cancer Neg Hx    Social History:  Married--husband with myositis and gait disorder. Retired Psychologist, occupational. She reports that she quit smoking about 34 years ago. She does not have any smokeless tobacco history on file. She reports that she drinks a glass of wine daily. She reports that she does not use illicit drugs.   Allergies  Allergen Reactions  . Meperidine Hcl Nausea And Vomiting  . Penicillins Itching and Rash   Medications Prior to Admission  Medication Sig Dispense Refill  . albuterol (PROVENTIL HFA;VENTOLIN HFA) 108 (90 BASE) MCG/ACT inhaler Inhale 2 puffs into the lungs every 6 (six) hours as needed for wheezing or shortness of breath.      Marland Kitchen amLODipine (NORVASC) 2.5 MG tablet Take 2.5 mg by mouth daily.      Marland Kitchen aspirin 81 MG tablet Take  81 mg by mouth daily.      . butalbital-acetaminophen-caffeine (FIORICET, ESGIC) 50-325-40 MG per tablet Take 1 tablet by mouth as needed for headache or migraine.      . citalopram (CELEXA) 20 MG tablet Take 20 mg by mouth every evening.      . cloNIDine (CATAPRES) 0.1 MG tablet Take 0.1 mg by mouth as needed (for systolic BP >629).      . fexofenadine (ALLEGRA) 180 MG tablet Take 1 tablet (180 mg total) by mouth daily.  30 tablet  11  . mesalamine (PENTASA) 250 MG CR capsule Take 500 mg by mouth every evening.      Marland Kitchen omeprazole (PRILOSEC OTC) 20 MG tablet Take 20 mg by mouth  daily.      . propranolol (INNOPRAN XL) 80 MG 24 hr capsule Take 80 mg by mouth 2 (two) times daily.      . rosuvastatin (CRESTOR) 20 MG tablet Take 20 mg by mouth 2 (two) times a week. Takes on Mon and Fri      . triazolam (HALCION) 0.25 MG tablet TAKE 1 TABLET AT BEDTIME AS NEEDED  30 tablet  5  . triazolam (HALCION) 0.25 MG tablet Take 0.125 mg by mouth at bedtime.        Home: Home Living Family/patient expects to be discharged to:: Unsure Additional Comments: Spouse has myositis and uses a SPC.  He is unable to physically assist her with gait.  They live in [place] and were visiting daughter and grandchildren when fall occurred.   Spouse is unsure about SNF (pt's mother is in SNF, and they prefer to avoid SNF if possible).  Options will be d/c to SNF or to daughter's home with daughter assisting.  Pt's spouse to discuss options with family   Functional History: Prior Function Level of Independence: Independent Comments: Pt required assist in the community due to homonymous hemianopsia.  However, pt and spouse report that she does drive sometimes (unsure if she has been approved to drive with her visual deficits) Functional Status:  Mobility: Bed Mobility Overal bed mobility: Needs Assistance Bed Mobility: Supine to Sit Supine to sit: Min assist General bed mobility comments: Assist mainly under her RLE at splint Transfers Overall transfer level: Needs assistance Equipment used: Rolling walker (2 wheeled) Transfers: Sit to/from Stand Sit to Stand: Mod assist Stand pivot transfers: Mod assist General transfer comment: Assist to steady at walker with cues for hand placement and for controlling walker Ambulation/Gait Ambulation/Gait assistance: Mod assist (second person for chair) Ambulation Distance (Feet): 12 Feet Assistive device: Rolling walker (2 wheeled) Gait Pattern/deviations:  (hopping on RW with pushing through of walker to maintain bal) Gait velocity: slow and halting  to step Gait velocity interpretation: Below normal speed for age/gender General Gait Details: Hopping on LLE    ADL: ADL Overall ADL's : Needs assistance/impaired Eating/Feeding: Modified independent;Sitting Grooming: Wash/dry hands;Wash/dry face;Oral care;Brushing hair;Applying deodorant;Set up;Sitting Upper Body Bathing: Set up;Sitting Lower Body Bathing: Maximal assistance;Sit to/from stand Upper Body Dressing : Minimal assistance;Sitting Lower Body Dressing: Total assistance;Sit to/from stand Toilet Transfer: Moderate assistance;Stand-pivot;RW;BSC Toileting- Clothing Manipulation and Hygiene: Total assistance;Sit to/from stand Functional mobility during ADLs: Moderate assistance General ADL Comments: Spoke at length with pt and spouse re: expectations and rehab options.  Spouse with many questions as they do not live in [place]: Cognition Overall Cognitive Status: Within Functional Limits for tasks assessed Orientation Level: Oriented X4 Cognition Arousal/Alertness: Lethargic Behavior During Therapy: [facility] for tasks assessed/performed  Overall Cognitive Status: Within Functional Limits for tasks assessed  Blood pressure 131/85, pulse 73, temperature 98.2 F (36.8 C), temperature source Oral, resp. rate 14, height 5' 2"  (1.575 m), weight 71.668 kg (158 lb), SpO2 98.00%. Physical Exam  Vitals reviewed. Constitutional: She is oriented to person, place, and time. She appears well-developed and well-nourished.  HENT:  Head: Atraumatic.  Eyes: Conjunctivae are normal. Pupils are equal, round, and reactive to light.  Neck: Normal range of motion. Neck supple.  Cardiovascular: Normal rate and regular rhythm.   Respiratory: Effort normal and breath sounds normal. No respiratory distress. She has no wheezes.  GI: Soft. Bowel sounds are normal.  Musculoskeletal:  Left ankle with splint. Tender with minimal manipulation. Moves BUE and RLE without difficulty.   Neurological: She  is alert and oriented to person, place, and time.  Lethargic, awakens easily. Follows simple commands. Notes some tingling in the left foot but can sense light touch and pain.   Skin: Skin is warm and dry.  Psychiatric: She has a normal mood and affect. Her behavior is normal. Judgment and thought content normal.    No results found for this or any previous visit (from the past 24 hour(s)). Dg Ankle Complete Left  06/27/2014   CLINICAL DATA:  ORIF of trimalleolar fracture.  EXAM: LEFT ANKLE COMPLETE - 3+ VIEW; DG C-ARM 61-120 MIN  COMPARISON:  None.  FINDINGS: Three images from portable C-arm radiography shows sideplate and screw fixation of the distal fibular fracture. There are 2 screws reducing the medial malleolar fracture. The posterior malleolar fracture is nondisplaced.  IMPRESSION: Status post ORIF of malleolar fractures.   Electronically Signed   By: Kerby Moors M.D.   On: 06/27/2014 12:37   Dg Ankle Left Port  06/27/2014   CLINICAL DATA:  Status post surgery for a left ankle trimalleolar fracture and dislocation.  EXAM: PORTABLE LEFT ANKLE - 2 VIEW  COMPARISON:  Yesterday and earlier today.  FINDINGS: Screw fixation of the previously demonstrated medial malleolus fracture with anatomic position and alignment. Screw and plate fixation of the previously demonstrated lateral malleolus fracture with anatomic position and alignment. The posterior malleolus fracture is not visualized. Some of the bony detail is obscured by overlying cast material.  IMPRESSION: Hardware fixation of the previously demonstrated medial and lateral malleolus fractures with anatomic position and alignment.   Electronically Signed   By: Enrique Sack M.D.   On: 06/27/2014 23:00   Dg C-arm 1-60 Min  06/27/2014   CLINICAL DATA:  ORIF of trimalleolar fracture.  EXAM: LEFT ANKLE COMPLETE - 3+ VIEW; DG C-ARM 61-120 MIN  COMPARISON:  None.  FINDINGS: Three images from portable C-arm radiography shows sideplate and screw  fixation of the distal fibular fracture. There are 2 screws reducing the medial malleolar fracture. The posterior malleolar fracture is nondisplaced.  IMPRESSION: Status post ORIF of malleolar fractures.   Electronically Signed   By: Kerby Moors M.D.   On: 06/27/2014 12:37    Assessment/Plan: Diagnosis: left trimalleolar ankle fx after fall. 1. Does the need for close, 24 hr/day medical supervision in concert with the patient's rehab needs make it unreasonable for this patient to be served in a less intensive setting? Yes 2. Co-Morbidities requiring supervision/potential complications: hx sarcoid, prior CVA, chronic low back pain,  3. Due to bladder management, bowel management, safety, skin/wound care, disease management, medication administration, pain management and patient education, does the patient require 24 hr/day rehab nursing? Yes 4. Does the patient require  coordinated care of a physician, rehab nurse, PT (1-2 hrs/day, 5 days/week) and OT (1-2 hrs/day, 5 days/week) to address physical and functional deficits in the context of the above medical diagnosis(es)? Yes Addressing deficits in the following areas: balance, endurance, locomotion, strength, transferring, bowel/bladder control, bathing, dressing, feeding, grooming and toileting 5. Can the patient actively participate in an intensive therapy program of at least 3 hrs of therapy per day at least 5 days per week? Yes 6. The potential for patient to make measurable gains while on inpatient rehab is excellent 7. Anticipated functional outcomes upon discharge from inpatient rehab are modified independent and supervision  with PT, modified independent and supervision with OT, n/a with SLP. 8. Estimated rehab length of stay to reach the above functional goals is: 5-7 days 9. Does the patient have adequate social supports to accommodate these discharge functional goals? Yes 10. Anticipated D/C setting: Home 11. Anticipated post D/C  treatments: HH therapy and Outpatient therapy 12. Overall Rehab/Functional Prognosis: excellent  RECOMMENDATIONS: This patient's condition is appropriate for continued rehabilitative care in the following setting: CIR Patient has agreed to participate in recommended program. Yes Note that insurance prior authorization may be required for reimbursement for recommended care.  Comment: Will stay with daughter initially who lives in [place]. Rehab Admissions Coordinator to follow up.  Thanks,  Meredith Staggers, MD, Mellody Drown     06/29/2014

## 2014-06-29 NOTE — Care Management Note (Signed)
CARE MANAGEMENT NOTE 06/29/2014  Patient:  Julia [name], Julia [name]   Account Number:  [id number]  Date Initiated:  06/29/2014  Documentation initiated by:  Ricki Miller  Subjective/Objective Assessment:   63 yr old female admitted with a left ankle fracture, patient under went a left ankle ORIF.     Action/Plan:   Patient being assessed for Inpatient rehab. Case manager will continue to monitor.   Anticipated DC Date:     Anticipated DC Plan:           Choice offered to / List presented to:             Status of service:  In process, will continue to follow  Per UR Regulation:  Reviewed for med. necessity/level of care/duration of stay

## 2014-06-29 NOTE — Progress Notes (Signed)
Physical Therapy Treatment Patient Details Name: Julia [name] MRN: [id number] DOB: Apr 12, 1951 Today's Date: 06/29/2014    History of Present Illness 63 yo female with ORIF after R trimalleolar fracture, NWB.      PT Comments    Patient progressing towards physical therapy goals, increasing ambulatory distance to 20 feet x 2 this AM. Requires min assist frequently for loss of balance towards pts right side and posteriorly. Tolerated therapeutic exercises and activities well however, ambulatory distance limited by fatigue. Updated recommendations/frequency for CIR due to pts prior level of independence, motivation to improve, and fall risk due to frequent balance loss. Patient will continue to benefit from skilled physical therapy services to further improve independence with functional mobility.   Follow Up Recommendations  CIR     Equipment Recommendations  Rolling walker with 5" wheels    Recommendations for Other Services       Precautions / Restrictions Precautions Precautions: Fall Restrictions Weight Bearing Restrictions: Yes RLE Weight Bearing: Weight bearing as tolerated LLE Weight Bearing: Non weight bearing Other Position/Activity Restrictions: NWB    Mobility  Bed Mobility Overal bed mobility: Needs Assistance Bed Mobility: Supine to Sit     Supine to sit: Supervision     General bed mobility comments: Supervision for safety. VC for technique. No physical assist required.  Transfers Overall transfer level: Needs assistance Equipment used: Rolling walker (2 wheeled) Transfers: Sit to/from Stand Sit to Stand: Min assist         General transfer comment: Min assist for boost and balance with sit<>stand from bed and recliner x2. VC for hand placement.  Ambulation/Gait Ambulation/Gait assistance: Min assist Ambulation Distance (Feet): 20 Feet (x2) Assistive device: Rolling walker (2 wheeled) Gait Pattern/deviations: Leaning posteriorly ("hop-to"  pattern)   Gait velocity interpretation: Below normal speed for age/gender General Gait Details: Educated on safe DME use with a rolling walker. VC for upright posture and forward gaze. Several instances of loss of balance towards pts left and posteriorly requiring assist to correct. Instructions for weight anteriorly on walker.   Stairs            Wheelchair Mobility    Modified Rankin (Stroke Patients Only)       Balance                                    Cognition Arousal/Alertness: Awake/alert Behavior During Therapy: WFL for tasks assessed/performed Overall Cognitive Status: Within Functional Limits for tasks assessed                      Exercises General Exercises - Lower Extremity Ankle Circles/Pumps: AROM;Right;10 reps;Seated Quad Sets: Strengthening;Left;10 reps;Seated Long Arc Quad: Strengthening;Left;10 reps;Seated    General Comments        Pertinent Vitals/Pain Pain Assessment: 0-10 Pain Score: 0-No pain Pain Location: Rt ankle Pain Descriptors / Indicators:  ("no pain, just tired") Pain Intervention(s): Monitored during session;Repositioned;Premedicated before session    Home Living                      Prior Function            PT Goals (current goals can now be found in the care plan section) Acute Rehab PT Goals PT Goal Formulation: With patient/family Time For Goal Achievement: 07/12/14 Potential to Achieve Goals: Good Progress towards PT goals: Progressing toward goals    Frequency  Min 5X/week    PT Plan Discharge plan needs to be updated;Frequency needs to be updated    Co-evaluation             End of Session Equipment Utilized During Treatment: Gait belt Activity Tolerance: Patient limited by fatigue Patient left: in chair;with call bell/phone within reach;with family/visitor present     Time: 3779-3968 PT Time Calculation (min): 27 min  Charges:  $Gait Training: 8-22  mins $Therapeutic Activity: 8-22 mins                    G Codes:      [employer], [place]  Ellouise Newer 06/29/2014, 10:31 AM

## 2014-06-29 NOTE — Progress Notes (Signed)
UR completed 

## 2014-06-29 NOTE — Progress Notes (Signed)
I met with pt and spouse at bedside. They prefer inpt rehab admission rather than SNF. I will follow up in the morning to verify if bed available tomorrow. They have a daughter here in [place] and one in [place]. If we do not have a bed available, they are open to inpt rehab at [facility] if accepted there. [phone]

## 2014-06-30 ENCOUNTER — Encounter (HOSPITAL_COMMUNITY): Payer: Self-pay | Admitting: Orthopedic Surgery

## 2014-06-30 DIAGNOSIS — S9305XA Dislocation of left ankle joint, initial encounter: Secondary | ICD-10-CM | POA: Diagnosis present

## 2014-06-30 DIAGNOSIS — S82852A Displaced trimalleolar fracture of left lower leg, initial encounter for closed fracture: Secondary | ICD-10-CM | POA: Diagnosis not present

## 2014-06-30 HISTORY — DX: Dislocation of left ankle joint, initial encounter: S93.05XA

## 2014-06-30 NOTE — Progress Notes (Signed)
[facility] Progress Note  Subjective  Doing well No new issues Ready for inpatient rehab if bed available  Sore throat improving  Review of Systems  Constitutional: Negative for fever and chills.  Respiratory: Negative for shortness of breath and wheezing.   Cardiovascular: Negative for chest pain.  Gastrointestinal: Negative for nausea, vomiting and abdominal pain.  Genitourinary: Negative for dysuria.  Neurological: Negative for tingling and sensory change.     Objective   BP 128/76  Pulse 64  Temp(Src) 99.1 F (37.3 C) (Oral)  Resp 16  Ht 5' 2"  (1.575 m)  Wt 71.668 kg (158 lb)  BMI 28.89 kg/m2  SpO2 93%  Intake/Output     10/19 0701 - 10/20 0700 10/20 0701 - 10/21 0700   P.O. 240    Total Intake(mL/kg) 240 (3.3)    Net +240          Urine Occurrence 2 x      Labs Results for SWAYZIE, CHOATE (MRN [id number]) as of 06/30/2014 08:52  Ref. Range 06/26/2014 23:18  Vit D, 25-Hydroxy Latest Range: 30-89 ng/mL 35    Exam   Gen: awake and alert, comfortable, NAD Lungs: clear, no wheezes or rhonchi   Cardiac: RRR, s1 and s2 Abd: + BS, NTND Ext:        Left Lower Extremity               Splint c/d/i             Ext warm             EHL, FHL, lesser toe motor intact             DPN, SPN, TN sensation intact             Ext warm             Swelling controlled               No pain with passive stretch     Assessment and Plan   POD/HD#: 3  1. L trimalleolar ankle fracture dislocation s/p ORIF               NWB x 6-8 weeks             Ice and elevate             Splint x 2 weeks             Toe and knee motion as tolerated             PT/OT               Walker or crutches               CIR when bed available                 2. Pain management:  Cont with current regimen     3. ABL anemia/Hemodynamics             Hemodynamics stable             Continue to monitor   4. Medical issues               HTN         Stable, continue home meds              Asthma  no additional issues yesterday                           Continue with IS q 1h while awake                         mobilize                            5. DVT/PE prophylaxis:             Lovenox x 21 days post op   6. ID:               Completed periop abx  7. Metabolic Bone Disease:             Vitamin D with in normal range but low end             Will need dexa as outpt             Low energy mechanism for fractures suspect baseline osteopenia/osteoporosis               Will start on calcium and vitamin d   8. Activity:             Up as tolerated while maintaining NWB on L leg               PT/OT   9. FEN/Foley/Lines:             Advance diet as tolerated            NSL IV    10.Ex-fix/Splint care:             Keep splint clean and dry             Do not remove splint              11. Impediments to fracture healing:             Postmenopausal             Suspect osteoporosis/osteopenia base off mechanism  12. Dispo:             PT/OT              CIR when bed available       Jari Pigg, PA-C [facility] [phone] (O) 06/30/2014 8:51 AM

## 2014-06-30 NOTE — Progress Notes (Signed)
Inpt acute rehab bed unlikely to be available today. I discussed with pt and spouse at bedside. I have notified RN CM that pt open to [facility] rehab if accepted. [phone]

## 2014-06-30 NOTE — Progress Notes (Signed)
Physical Therapy Treatment Patient Details Name: Julia [name] MRN: [id number] DOB: May 12, 1951 Today's Date: 06/30/2014    History of Present Illness 63 yo female with ORIF after R trimalleolar fracture, NWB.      PT Comments    Pt was having consultation with inpt rehab nursing coordinator when PT arrived, and plan is to look in [facility] to see if a bed is available there.  Her plan is to continue there and then will consider what [facility] is appropriate for her.  Follow Up Recommendations  CIR;Supervision/Assistance - 24 hour     Equipment Recommendations  None recommended by PT    Recommendations for Other Services Rehab consult     Precautions / Restrictions Precautions Precautions: Fall Restrictions Weight Bearing Restrictions: Yes RLE Weight Bearing: Weight bearing as tolerated LLE Weight Bearing: Non weight bearing    Mobility  Bed Mobility Overal bed mobility: Needs Assistance Bed Mobility: Supine to Sit;Sit to Supine     Supine to sit: Supervision Sit to supine: Min assist   General bed mobility comments: instructed her on the technique to get her LLE onto bed with RLE supporting  Transfers Overall transfer level: Needs assistance Equipment used: Rolling walker (2 wheeled) (knee walker) Transfers: Sit to/from [employer] Transfers Sit to Stand: Min guard Stand pivot transfers: Min assist       General transfer comment: practiced the transition off knee walker several times  Ambulation/Gait Ambulation/Gait assistance: Supervision Ambulation Distance (Feet): 300 Feet Assistive device:  (knee walker) Gait Pattern/deviations:  (hops a step with L knee on knee walker, lists a little to R) Gait velocity: slow Gait velocity interpretation: Below normal speed for age/gender General Gait Details: Instructing brakes and control of knee walker to transition to sit and to get off bed   Stairs            Wheelchair Mobility    Modified Rankin  (Stroke Patients Only)       Balance Overall balance assessment: Needs assistance Sitting-balance support: Single extremity supported Sitting balance-Leahy Scale: Good   Postural control: Posterior lean Standing balance support: Bilateral upper extremity supported;Single extremity supported (knee on LLE and RLE) Standing balance-Leahy Scale: Poor                      Cognition Arousal/Alertness: Awake/alert Behavior During Therapy: WFL for tasks assessed/performed Overall Cognitive Status: Within Functional Limits for tasks assessed                      Exercises General Exercises - Lower Extremity Gluteal Sets: Strengthening;Right;10 reps Heel Slides: AROM;Right;10 reps Hip ABduction/ADduction: AROM;AAROM;Both;10 reps Straight Leg Raises: AROM;Both;10 reps;AAROM    General Comments General comments (skin integrity, edema, etc.): splint continues to be in place on LLE and ace wrap is clean and dry      Pertinent Vitals/Pain Pain Assessment: No/denies pain (at rest) Pain Score: 4  Pain Location: L ankle Pain Intervention(s): Limited activity within patient's tolerance;Premedicated before session    Home Living                      Prior Function            PT Goals (current goals can now be found in the care plan section) Acute Rehab PT Goals Patient Stated Goal: to get better Progress towards PT goals: Progressing toward goals    Frequency  Min 5X/week    PT Plan Current plan remains  appropriate    Co-evaluation             End of Session Equipment Utilized During Treatment: Gait belt Activity Tolerance: Patient limited by fatigue Patient left: in bed;with call bell/phone within reach;with family/visitor present     Time: 9050-2561 PT Time Calculation (min): 34 min  Charges:  $Gait Training: 8-22 mins $Therapeutic Exercise: 8-22 mins                    G Codes:      Ramond Dial July 08, 2014, 11:10 AM Mee Hives, PT  [facility] Acute Rehab Dept. Number: [phone]

## 2014-06-30 NOTE — Discharge Instructions (Signed)
Orthopaedic Trauma Service Discharge Instructions   General Discharge Instructions  WEIGHT BEARING STATUS: Nonweightbearing Left leg  RANGE OF MOTION/ACTIVITY: no ankle range of motion until splint removed.  Will remove at 1st post op appointment. Ok to move toes and L knee as tolerated   Diet: as you were eating previously.  Can use over the counter stool softeners and bowel preparations, such as Miralax, to help with bowel movements.  Narcotics can be constipating.  Be sure to drink plenty of fluids  STOP SMOKING OR USING NICOTINE PRODUCTS!!!!  As discussed nicotine severely impairs your body's ability to heal surgical and traumatic wounds but also impairs bone healing.  Wounds and bone heal by forming microscopic blood vessels (angiogenesis) and nicotine is a vasoconstrictor (essentially, shrinks blood vessels).  Therefore, if vasoconstriction occurs to these microscopic blood vessels they essentially disappear and are unable to deliver necessary nutrients to the healing tissue.  This is one modifiable factor that you can do to dramatically increase your chances of healing your injury.    (This means no smoking, no nicotine gum, patches, etc)  DO NOT USE NONSTEROIDAL ANTI-INFLAMMATORY DRUGS (NSAID'S)  Using products such as Advil (ibuprofen), Aleve (naproxen), Motrin (ibuprofen) for additional pain control during fracture healing can delay and/or prevent the healing response.  If you would like to take over the counter (OTC) medication, Tylenol (acetaminophen) is ok.  However, some narcotic medications that are given for pain control contain acetaminophen as well. Therefore, you should not exceed more than 4000 mg of tylenol in a day if you do not have liver disease.  Also note that there are may OTC medicines, such as cold medicines and allergy medicines that my contain tylenol as well.  If you have any questions about medications and/or interactions please ask your doctor/PA or your pharmacist.     PAIN MEDICATION USE AND EXPECTATIONS  You have likely been given narcotic medications to help control your pain.  After a traumatic event that results in an fracture (broken bone) with or without surgery, it is ok to use narcotic pain medications to help control one's pain.  We understand that everyone responds to pain differently and each individual patient will be evaluated on a regular basis for the continued need for narcotic medications. Ideally, narcotic medication use should last no more than 6-8 weeks (coinciding with fracture healing).   As a patient it is your responsibility as well to monitor narcotic medication use and report the amount and frequency you use these medications when you come to your office visit.   We would also advise that if you are using narcotic medications, you should take a dose prior to therapy to maximize you participation.  IF YOU ARE ON NARCOTIC MEDICATIONS IT IS NOT PERMISSIBLE TO OPERATE A MOTOR VEHICLE (MOTORCYCLE/CAR/TRUCK/MOPED) OR HEAVY MACHINERY DO NOT MIX NARCOTICS WITH OTHER CNS (CENTRAL NERVOUS SYSTEM) DEPRESSANTS SUCH AS ALCOHOL       ICE AND ELEVATE INJURED/OPERATIVE EXTREMITY  Using ice and elevating the injured extremity above your heart can help with swelling and pain control.  Icing in a pulsatile fashion, such as 20 minutes on and 20 minutes off, can be followed.    Do not place ice directly on skin. Make sure there is a barrier between to skin and the ice pack.    Using frozen items such as frozen peas works well as the conform nicely to the are that needs to be iced.  USE AN ACE WRAP OR TED HOSE FOR  SWELLING CONTROL  In addition to icing and elevation, Ace wraps or TED hose are used to help limit and resolve swelling.  It is recommended to use Ace wraps or TED hose until you are informed to stop.    When using Ace Wraps start the wrapping distally (farthest away from the body) and wrap proximally (closer to the body)   Example: If you had  surgery on your leg or thing and you do not have a splint on, start the ace wrap at the toes and work your way up to the thigh        If you had surgery on your upper extremity and do not have a splint on, start the ace wrap at your fingers and work your way up to the upper arm  IF YOU ARE IN A SPLINT OR CAST DO NOT [place]   If your splint gets wet for any reason please contact the office immediately. You may shower in your splint or cast as long as you keep it dry.  This can be done by wrapping in a cast cover or garbage back (or similar)  Do Not stick any thing down your splint or cast such as pencils, money, or hangers to try and scratch yourself with.  If you feel itchy take benadryl as prescribed on the bottle for itching  IF YOU ARE IN A CAM BOOT (BLACK BOOT)  You may remove boot periodically. Perform daily dressing changes as noted below.  Wash the liner of the boot regularly and wear a sock when wearing the boot. It is recommended that you sleep in the boot until told otherwise  [facility]: [phone]

## 2014-06-30 NOTE — Discharge Summary (Signed)
Orthopaedic Trauma Service (OTS)  Patient ID: Julia [name] MRN: [id number] DOB/AGE: [birth year] 65 y.o.  Admit date: 06/26/2014 Discharge date: 06/30/2014  Admission Diagnoses: Fall Closed Left trimalleolar ankle fracture dislocation GERD HTN Crohns disease  Discharge Diagnoses:  Principal Problem:   Trimalleolar fracture of left ankle Active Problems:   Essential hypertension   GERD   Crohn's disease   Dislocation of left ankle joint   Procedures Performed: 06/27/2014-Dr. Handy 1. Open reduction and internal fixation of left trimalleolar ankle     fracture without fixation of the posterior lip. 2. Stress fluoroscopy of the left ankle syndesmosis    Discharged Condition: good  Hospital Course:  63 year old white female admitted on 06/26/2014 after sustaining a ground-level fall with resultant left ankle fracture dislocation. The patient was seen and evaluated in the emergency department at [facility]. Attempts at closed reduction were made. Her ankle remain partially subluxed. She was admitted to the orthopedic service with anticipation for placement of external fixator. Patient was taken to the OR on 06/27/2014. After removal of her splint her soft tissue swelling was minimal and it was felt that she could be fixed primarily with plate osteosynthesis. Patient tolerated the procedure well. After surgery she was transferred back up to the orthopedic floor for continued observation and pain control and to begin therapies. Patient progressed well during her hospital stay. Inpatient rehabilitation consult was obtained as there is some current concerns about the patient's ability to mobilize at home. She does live with her husband but her husband is partially disabled. She was felt to be an appropriate candidate for inpatient rehabilitation. And on postoperative day #3 she was deemed to be stable for discharge to rehabilitation. Patient was started on Lovenox for DVT and PE  prophylaxis on postoperative day #1. To remain on this for a total of 21 days postoperatively. We did adjust her pain medications on postoperative day #1 as well. She had better pain control with hydrocodone as opposed to Percocet. Patient discharged to inpatient in stable condition.  We also did check a vitamin D level on the patient which was low normal at 35. I would recommend continuation with over-the-counter supplementation of vitamin D and calcium. We also recommend outpatient DEXA scan to quantitatively evaluate her bone density.  Consults: rehabilitation medicine  Significant Diagnostic Studies: labs:   Results for NAYELLIE, SANSEVERINO (MRN [id number]) as of 06/30/2014 09:23  Ref. Range 06/27/2014 04:08  Sodium Latest Range: 137-147 mEq/L 138  Potassium Latest Range: 3.7-5.3 mEq/L 3.8  Chloride Latest Range: 96-112 mEq/L 98  CO2 Latest Range: 19-32 mEq/L 26  BUN Latest Range: 6-23 mg/dL 15  Creatinine Latest Range: 0.50-1.10 mg/dL 0.72  Calcium Latest Range: 8.4-10.5 mg/dL 8.9  GFR calc non Af Amer Latest Range: >90 mL/min 88 (L)  GFR calc Af Amer Latest Range: >90 mL/min >90  Glucose Latest Range: 70-99 mg/dL 139 (H)  Anion gap Latest Range: 5-15  14  Alkaline Phosphatase Latest Range: 39-117 U/L 67  Albumin Latest Range: 3.5-5.2 g/dL 3.4 (L)  AST Latest Range: 0-37 U/L 16  ALT Latest Range: 0-35 U/L 16  Total Protein Latest Range: 6.0-8.3 g/dL 6.8  Total Bilirubin Latest Range: 0.3-1.2 mg/dL 0.3  WBC Latest Range: 4.0-10.5 K/uL 7.1  RBC Latest Range: 3.87-5.11 MIL/uL 4.25  Hemoglobin Latest Range: 12.0-15.0 g/dL 13.2  HCT Latest Range: 36.0-46.0 % 39.9  MCV Latest Range: 78.0-100.0 fL 93.9  MCH Latest Range: 26.0-34.0 pg 31.1  MCHC Latest Range: 30.0-36.0  g/dL 33.1  RDW Latest Range: 11.5-15.5 % 12.9  Platelets Latest Range: 150-400 K/uL 195  Results for ALITZEL, COOKSON (MRN [id number]) as of 06/30/2014 09:23  Ref. Range 06/26/2014 23:18  Vit D, 25-Hydroxy Latest  Range: 30-89 ng/mL 35   Treatments: IV hydration, antibiotics: vancomycin, analgesia: acetaminophen and Norco, anticoagulation: LMW heparin, respiratory therapy: albuterol nebulizer, therapies: PT, OT, RN and SW and surgery: As above  Discharge Exam:    Orthopaedic Trauma Service Progress Note  Subjective  Doing well No new issues Ready for inpatient rehab if bed available   Sore throat improving  Review of Systems  Constitutional: Negative for fever and chills.  Respiratory: Negative for shortness of breath and wheezing.   Cardiovascular: Negative for chest pain.  Gastrointestinal: Negative for nausea, vomiting and abdominal pain.  Genitourinary: Negative for dysuria.  Neurological: Negative for tingling and sensory change.     Objective   BP 128/76  Pulse 64  Temp(Src) 99.1 F (37.3 C) (Oral)  Resp 16  Ht 5' 2"  (1.575 m)  Wt 71.668 kg (158 lb)  BMI 28.89 kg/m2  SpO2 93%  Intake/Output     10/19 0701 - 10/20 0700 10/20 0701 - 10/21 0700    P.O. 240     Total Intake(mL/kg) 240 (3.3)     Net +240            Urine Occurrence 2 x       Labs Results for MAZI, SCHUFF (MRN [id number]) as of 06/30/2014 08:52   Ref. Range  06/26/2014 23:18   Vit D, 25-Hydroxy  Latest Range: 30-89 ng/mL  35     Exam   Gen: awake and alert, comfortable, NAD Lungs: clear, no wheezes or rhonchi   Cardiac: RRR, s1 and s2 Abd: + BS, NTND Ext:        Left Lower Extremity               Splint c/d/i             Ext warm             EHL, FHL, lesser toe motor intact             DPN, SPN, TN sensation intact             Ext warm             Swelling controlled               No pain with passive stretch     Assessment and Plan   POD/HD#: 3  1. L trimalleolar ankle fracture dislocation s/p ORIF               NWB x 6-8 weeks             Ice and elevate             Splint x 2 weeks             Toe and knee motion as tolerated             PT/OT               Walker or  crutches               CIR when bed available                 2. Pain management:             Cont  with current regimen     3. ABL anemia/Hemodynamics             Hemodynamics stable             Continue to monitor   4. Medical issues               HTN                         Stable, continue home meds              Asthma                         no additional issues yesterday                           Continue with IS q 1h while awake                         mobilize                            5. DVT/PE prophylaxis:             Lovenox x 21 days post op   6. ID:               Completed periop abx  7. Metabolic Bone Disease:             Vitamin D with in normal range but low end             Will need dexa as outpt             Low energy mechanism for fractures suspect baseline osteopenia/osteoporosis               Will start on calcium and vitamin d   8. Activity:             Up as tolerated while maintaining NWB on L leg               PT/OT   9. FEN/Foley/Lines:             Advance diet as tolerated            NSL IV    10.Ex-fix/Splint care:             Keep splint clean and dry             Do not remove splint              11. Impediments to fracture healing:             Postmenopausal             Suspect osteoporosis/osteopenia base off mechanism  12. Dispo:             PT/OT              CIR when bed available       Jari Pigg, PA-C [facility] [phone] (O) 06/30/2014 8:51 AM   Disposition: CIR  Meds  Continue home meds   Lovenox 40 mg sq daily x 18 more days  Norco 7.5/325 1-2 po q6h prn  lyrica 75 mg po q12   Robaxin 500 mg 1-2 po q6h prn spasms    Follow-up  Information   Follow up with HANDY,MICHAEL H, MD In 2 weeks. (For suture removal, For wound re-check)    Specialty:  Orthopedic Surgery   Contact information:   [address] [place] [address] [phone]        Signed:  Jari Pigg, PA-C [facility] [phone] (P) 06/30/2014, 9:01 AM

## 2014-07-01 ENCOUNTER — Encounter (HOSPITAL_COMMUNITY): Payer: Self-pay | Admitting: General Practice

## 2014-07-01 DIAGNOSIS — S82852A Displaced trimalleolar fracture of left lower leg, initial encounter for closed fracture: Secondary | ICD-10-CM | POA: Diagnosis not present

## 2014-07-01 MED ORDER — CALCIUM CITRATE 950 (200 CA) MG PO TABS: 200.0000 mg | ORAL_TABLET | Freq: Two times a day (BID) | ORAL | Status: DC

## 2014-07-01 MED ORDER — HYDROCODONE-ACETAMINOPHEN 7.5-325 MG PO TABS: 1.0000 | ORAL_TABLET | Freq: Four times a day (QID) | ORAL | Status: DC | PRN

## 2014-07-01 MED ORDER — ENOXAPARIN SODIUM 40 MG/0.4ML ~~LOC~~ SOLN: 40.0000 mg | SUBCUTANEOUS | Status: DC

## 2014-07-01 MED ORDER — CALCIUM CITRATE 950 (200 CA) MG PO TABS
200.0000 mg | ORAL_TABLET | Freq: Two times a day (BID) | ORAL | Status: DC
  Administered 2014-07-01: 200 mg via ORAL
  Filled 2014-07-01 (×2): qty 1

## 2014-07-01 MED ORDER — METHOCARBAMOL 500 MG PO TABS: 500.0000 mg | ORAL_TABLET | Freq: Four times a day (QID) | ORAL | Status: DC | PRN

## 2014-07-01 MED ORDER — PREGABALIN 75 MG PO CAPS: 75.0000 mg | ORAL_CAPSULE | Freq: Two times a day (BID) | ORAL | Status: DC

## 2014-07-01 MED ORDER — VITAMIN D3 25 MCG (1000 UNIT) PO TABS: 1000.0000 [IU] | ORAL_TABLET | Freq: Two times a day (BID) | ORAL | Status: DC

## 2014-07-01 MED ORDER — VITAMIN D3 25 MCG (1000 UNIT) PO TABS
1000.0000 [IU] | ORAL_TABLET | Freq: Two times a day (BID) | ORAL | Status: DC
  Administered 2014-07-01: 1000 [IU] via ORAL
  Filled 2014-07-01 (×2): qty 1

## 2014-07-01 MED ORDER — OXYCODONE HCL 5 MG PO TABS: 5.0000 mg | ORAL_TABLET | Freq: Four times a day (QID) | ORAL | Status: DC | PRN

## 2014-07-01 NOTE — Progress Notes (Signed)
Patient provided with discharge instructions and follow up information. She has been provided with DME for home. HHPT set up by Case management. She is going home at this time with husband.

## 2014-07-01 NOTE — Progress Notes (Signed)
Physical Therapy Treatment Patient Details Name: Julia [name] MRN: [id number] DOB: 09/22/1950 Today's Date: 07/01/2014    History of Present Illness 63 yo female with ORIF after R trimalleolar fracture, NWB.      PT Comments    Pt was seen and after the case manager reports pt will be going home today.  Her plan was for CIR admission and now is going to require HHPT with 24/7 assistance, asked her CM for nursing assistant to help pt as she is unsafe in [place] transfers and husband will not be able to physically assist her.    Follow Up Recommendations  Supervision/Assistance - 24 hour;Home health PT;CIR (Pt is going home today per CM, will need knee walker)     Equipment Recommendations  Other (comment) (knee walker)    Recommendations for Other Services       Precautions / Restrictions Precautions Precautions: Fall Restrictions Weight Bearing Restrictions: Yes RLE Weight Bearing: Weight bearing as tolerated LLE Weight Bearing: Non weight bearing    Mobility  Bed Mobility Overal bed mobility: Needs Assistance Bed Mobility: Supine to Sit;Sit to Supine     Supine to sit: Supervision Sit to supine: Min guard   General bed mobility comments: instructed on guarding wb ing on LLE  Transfers Overall transfer level: Needs assistance Equipment used: Rolling walker (2 wheeled) (knee walker) Transfers: Sit to/from [employer] Transfers Sit to Stand: Min guard Stand pivot transfers: Min assist       General transfer comment: transfer to BR with knee walker and  min assist and dense cues for using bar on wall, unsafe without help  Ambulation/Gait Ambulation/Gait assistance: Supervision Ambulation Distance (Feet): 300 Feet Assistive device:  (knee walker) Gait Pattern/deviations: Drifts right/left (close to obstacles) Gait velocity: slow Gait velocity interpretation: Below normal speed for age/gender General Gait Details: Instructing brakes and control of knee  walker to transition to sit and to get off bed (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)   Stairs            Wheelchair Mobility    Modified Rankin (Stroke Patients Only)       Balance Overall balance assessment: Needs assistance Sitting-balance support: Bilateral upper extremity supported;Single extremity supported Sitting balance-Leahy Scale: Good   Postural control: Posterior lean Standing balance support: Bilateral upper extremity supported;Single extremity supported Standing balance-Leahy Scale: Poor                      Cognition Arousal/Alertness: Awake/alert Behavior During Therapy: WFL for tasks assessed/performed Overall Cognitive Status: Within Functional Limits for tasks assessed                      Exercises      General Comments General comments (skin integrity, edema, etc.): LLE hurting more today per pt but is hurting more in general      Pertinent Vitals/Pain Pain Assessment: 0-10 Pain Score: 6  Pain Location: L ankle Pain Descriptors / Indicators: Burning;Tingling Pain Intervention(s): Limited activity within patient's tolerance;Monitored during session;Patient requesting pain meds-RN notified    Home Living                      Prior Function            PT Goals (current goals can now be found in the care plan section) Acute Rehab PT Goals Patient Stated Goal: get better Progress towards PT goals: Progressing toward goals  Frequency  Min 5X/week    PT Plan Discharge plan needs to be updated    Co-evaluation             End of Session Equipment Utilized During Treatment: Gait belt Activity Tolerance: Patient limited by pain;Patient limited by fatigue Patient left: in bed;with call bell/phone within reach;with family/visitor present     Time: 0912-0942 PT Time Calculation (min): 30 min  Charges:  $Gait Training: 8-22 mins $Therapeutic Activity: 8-22 mins                    G Codes:       Ramond Dial 07/22/14, 9:59 AM  Mee Hives, PT [facility] Acute Rehab Dept. Number: [phone]

## 2014-07-01 NOTE — Progress Notes (Signed)
Orthopaedic Trauma Service Progress Note  Subjective  Doing well  No acute issues  Pain improving  Review of Systems  Constitutional: Negative for fever and chills.  Respiratory: Negative for shortness of breath and wheezing.   Cardiovascular: Negative for chest pain and palpitations.  Gastrointestinal: Negative for nausea, vomiting and abdominal pain.  Genitourinary: Negative for dysuria.     Objective   BP 125/77  Pulse 56  Temp(Src) 97.8 F (36.6 C) (Oral)  Resp 16  Ht 5' 2"  (1.575 m)  Wt 71.668 kg (158 lb)  BMI 28.89 kg/m2  SpO2 96%  Intake/Output     10/20 0701 - 10/21 0700 10/21 0701 - 10/22 0700   P.O. 960    Total Intake(mL/kg) 960 (13.4)    Net +960          Urine Occurrence 4 x 1 x     Labs  No new labs   Exam  Gen: awake and alert, comfortable, NAD Ext:        Left Lower Extremity               Splint c/d/i             Ext warm             EHL, FHL, lesser toe motor intact             DPN, SPN, TN sensation grossly intact             Ext warm             Swelling controlled               No pain with passive stretch     Assessment and Plan   POD/HD#: 4   1. L trimalleolar ankle fracture dislocation s/p ORIF               NWB x 6-8 weeks             Ice and elevate             Splint x 2 weeks             Toe and knee motion as tolerated             PT/OT               Walker or crutches               CIR when bed available                 2. Pain management:             Cont with current regimen     3. ABL anemia/Hemodynamics             Hemodynamics stable  4. Medical issues               HTN                         Stable, continue home meds              Asthma                         stable                           5. DVT/PE prophylaxis:  Lovenox x 21 days post op   6. ID:               Completed periop abx  7. Metabolic Bone Disease:             Vitamin D with in normal range but low end      Will need dexa as outpt             Low energy mechanism for fractures suspect baseline osteopenia/osteoporosis               Will start on calcium and vitamin d   8. Activity:             Up as tolerated while maintaining NWB on L leg               PT/OT   9. FEN/Foley/Lines:             Advance diet as tolerated            NSL IV    10.Ex-fix/Splint care:             Keep splint clean and dry             Do not remove splint              11. Impediments to fracture healing:             Postmenopausal             Suspect osteoporosis/osteopenia base off mechanism  12. Dispo:             PT/OT              CIR when bed available       Jari Pigg, PA-C [facility] [phone] (O) 07/01/2014 8:34 AM

## 2014-07-01 NOTE — Care Management Note (Signed)
CARE MANAGEMENT NOTE 07/01/2014  Patient:  Julia [name], Julia [name]   Account Number:  [id number]  Date Initiated:  06/29/2014  Documentation initiated by:  Ricki Miller  Subjective/Objective Assessment:   63 yr old female admitted with a left ankle fracture, patient under went a left ankle ORIF.     Action/Plan:   Patient being assessed for Inpatient rehab. Case manager will continue to monitor.   Anticipated DC Date:  07/01/2014   Anticipated DC Plan:  [place]  CM consult      [place]   Choice offered to / List presented to:  C-1 Patient   DME arranged  Vassie Moselle      DME agency  [place] arranged  [place] agency  OTHER - SEE NOTE   Status of service:  Completed, signed off Medicare Important Message given?   (If response is "NO", the following Medicare IM given date fields will be blank) Date Medicare IM given:   Medicare IM given by:   Date Additional Medicare IM given:   Additional Medicare IM given by:    Discharge Disposition:  [place]  Per UR Regulation:  Reviewed for med. necessity/level of care/duration of stay  If discussed at [place] of Stay Meetings, dates discussed:    Comments:  07/01/14 10:00am Ricki Miller, RN BSN Case Manager Case Manager spoke with Tally Due, from [facility]. They have not been authorized to accept patient. Case manager spoke with patient concerning need to discharge home with home health. Choice offered. Patient gave case Manager permission to locate a home health agency in [place]. Case manager contacted Cristy Friedlander at [facility] [phone], Fax: [phone]. Orders, demographics and Op notes faxed to her. They will begin care on Monday 07/06/14.

## 2014-07-01 NOTE — Discharge Summary (Signed)
[facility] (OTS)  Patient ID: Kinza Gouveia MRN: [id number] DOB/AGE: 14-Jul-1951 63 y.o.  Admit date: 06/26/2014 Discharge date: 07/01/2014  Please see previous dc summary Initial plan was for inpatient rehab, ultimately no bed available and it was felt that pt could ultimately return home with HHPT Pt stable for dc on 07/01/2014  Discharge Diagnoses:  Principal Problem:   Trimalleolar fracture of left ankle Active Problems:   Essential hypertension   GERD   Crohn's disease   Dislocation of left ankle joint    Discharged Condition: good   Consults: None and rehabilitation medicine   Discharge Exam:  [facility] Progress Note  Subjective  Doing well   No acute issues   Pain improving  Review of Systems  Constitutional: Negative for fever and chills.  Respiratory: Negative for shortness of breath and wheezing.   Cardiovascular: Negative for chest pain and palpitations.  Gastrointestinal: Negative for nausea, vomiting and abdominal pain.  Genitourinary: Negative for dysuria.     Objective   BP 125/77  Pulse 56  Temp(Src) 97.8 F (36.6 C) (Oral)  Resp 16  Ht 5' 2"  (1.575 m)  Wt 71.668 kg (158 lb)  BMI 28.89 kg/m2  SpO2 96%  Intake/Output     10/20 0701 - 10/21 0700 10/21 0701 - 10/22 0700    P.O. 960     Total Intake(mL/kg) 960 (13.4)     Net +960            Urine Occurrence 4 x 1 x      Labs  No new labs   Exam  Gen: awake and alert, comfortable, NAD Ext:        Left Lower Extremity               Splint c/d/i             Ext warm             EHL, FHL, lesser toe motor intact             DPN, SPN, TN sensation grossly intact             Ext warm             Swelling controlled               No pain with passive stretch     Assessment and Plan   POD/HD#: 4   1. L trimalleolar ankle fracture dislocation s/p ORIF               NWB x 6-8 weeks             Ice and elevate  Splint x 2 weeks             Toe and knee motion as tolerated             PT/OT               Walker or crutches               CIR when bed available                 2. Pain management:             Cont with current regimen     3. ABL anemia/Hemodynamics             Hemodynamics stable  4. Medical issues  HTN                         Stable, continue home meds              Asthma                         stable                            5. DVT/PE prophylaxis:             Lovenox x 21 days post op   6. ID:               Completed periop abx  7. Metabolic Bone Disease:             Vitamin D with in normal range but low end             Will need dexa as outpt             Low energy mechanism for fractures suspect baseline osteopenia/osteoporosis               Will start on calcium and vitamin d   8. Activity:             Up as tolerated while maintaining NWB on L leg               PT/OT   9. FEN/Foley/Lines:             Advance diet as tolerated            NSL IV    10.Ex-fix/Splint care:             Keep splint clean and dry             Do not remove splint              11. Impediments to fracture healing:             Postmenopausal             Suspect osteoporosis/osteopenia base off mechanism  12. Dispo:             PT/OT              CIR when bed available       Jari Pigg, PA-C [facility] [phone] (O) 07/01/2014 8:34 AM      Disposition:   Discharge Instructions   Call MD / Call 911    Complete by:  As directed   If you experience chest pain or shortness of breath, CALL 911 and be transported to the hospital emergency room.  If you develope a fever above 101 F, pus (white drainage) or increased drainage or redness at the wound, or calf pain, call your surgeon's office.     Constipation Prevention    Complete by:  As directed   Drink plenty of fluids.  Prune juice may be helpful.  You may use a  stool softener, such as Colace (over the counter) 100 mg twice a day.  Use MiraLax (over the counter) for constipation as needed.     Diet - low sodium heart healthy    Complete by:  As directed      Discharge instructions    Complete by:  As directed   [facility]thopa[facility] Trauma Service Discharge  Instructions   General Discharge Instructions  WEIGHT BEARING STATUS: Nonweightbearing Left Leg  RANGE OF MOTION/ACTIVITY:no ankle range of motion. Splint remains on until follow up   Diet: as you were eating previously.  Can use over the counter stool softeners and bowel preparations, such as Miralax, to help with bowel movements.  Narcotics can be constipating.  Be sure to drink plenty of fluids  STOP SMOKING OR USING NICOTINE PRODUCTS!!!!  As discussed nicotine severely impairs your body's ability to heal surgical and traumatic wounds but also impairs bone healing.  Wounds and bone heal by forming microscopic blood vessels (angiogenesis) and nicotine is a vasoconstrictor (essentially, shrinks blood vessels).  Therefore, if vasoconstriction occurs to these microscopic blood vessels they essentially disappear and are unable to deliver necessary nutrients to the healing tissue.  This is one modifiable factor that you can do to dramatically increase your chances of healing your injury.    (This means no smoking, no nicotine gum, patches, etc)  DO NOT USE NONSTEROIDAL ANTI-INFLAMMATORY DRUGS (NSAID'S)  Using products such as Advil (ibuprofen), Aleve (naproxen), Motrin (ibuprofen) for additional pain control during fracture healing can delay and/or prevent the healing response.  If you would like to take over the counter (OTC) medication, Tylenol (acetaminophen) is ok.  However, some narcotic medications that are given for pain control contain acetaminophen as well. Therefore, you should not exceed more than 4000 mg of tylenol in a day if you do not have liver disease.  Also note that there are may OTC medicines,  such as cold medicines and allergy medicines that my contain tylenol as well.  If you have any questions about medications and/or interactions please ask your doctor/PA or your pharmacist.   PAIN MEDICATION USE AND EXPECTATIONS  You have likely been given narcotic medications to help control your pain.  After a traumatic event that results in an fracture (broken bone) with or without surgery, it is ok to use narcotic pain medications to help control one's pain.  We understand that everyone responds to pain differently and each individual patient will be evaluated on a regular basis for the continued need for narcotic medications. Ideally, narcotic medication use should last no more than 6-8 weeks (coinciding with fracture healing).   As a patient it is your responsibility as well to monitor narcotic medication use and report the amount and frequency you use these medications when you come to your office visit.   We would also advise that if you are using narcotic medications, you should take a dose prior to therapy to maximize you participation.  IF YOU ARE ON NARCOTIC MEDICATIONS IT IS NOT PERMISSIBLE TO OPERATE A MOTOR VEHICLE (MOTORCYCLE/CAR/TRUCK/MOPED) OR HEAVY MACHINERY DO NOT MIX NARCOTICS WITH OTHER CNS (CENTRAL NERVOUS SYSTEM) DEPRESSANTS SUCH AS ALCOHOL       ICE AND ELEVATE INJURED/OPERATIVE EXTREMITY  Using ice and elevating the injured extremity above your heart can help with swelling and pain control.  Icing in a pulsatile fashion, such as 20 minutes on and 20 minutes off, can be followed.    Do not place ice directly on skin. Make sure there is a barrier between to skin and the ice pack.    Using frozen items such as frozen peas works well as the conform nicely to the are that needs to be iced.  USE AN ACE WRAP OR TED HOSE FOR SWELLING CONTROL  In addition to icing and elevation, Ace wraps or TED hose are used to help limit and  resolve swelling.  It is recommended to use Ace wraps  or TED hose until you are informed to stop.    When using Ace Wraps start the wrapping distally (farthest away from the body) and wrap proximally (closer to the body)   Example: If you had surgery on your leg or thing and you do not have a splint on, start the ace wrap at the toes and work your way up to the thigh        If you had surgery on your upper extremity and do not have a splint on, start the ace wrap at your fingers and work your way up to the upper arm  IF YOU ARE IN A SPLINT OR CAST DO NOT [place]   If your splint gets wet for any reason please contact the office immediately. You may shower in your splint or cast as long as you keep it dry.  This can be done by wrapping in a cast cover or garbage back (or similar)  Do Not stick any thing down your splint or cast such as pencils, money, or hangers to try and scratch yourself with.  If you feel itchy take benadryl as prescribed on the bottle for itching  IF YOU ARE IN A CAM BOOT (BLACK BOOT)  You may remove boot periodically. Perform daily dressing changes as noted below.  Wash the liner of the boot regularly and wear a sock when wearing the boot. It is recommended that you sleep in the boot until told otherwise  CALL THE OFFICE WITH ANY QUESTIONS OR CONCERTS: [phone]     Driving restrictions    Complete by:  As directed   No driving     Increase activity slowly as tolerated    Complete by:  As directed             Medication List         albuterol 108 (90 BASE) MCG/ACT inhaler  Commonly known as:  PROVENTIL HFA;VENTOLIN HFA  Inhale 2 puffs into the lungs every 6 (six) hours as needed for wheezing or shortness of breath.     amLODipine 2.5 MG tablet  Commonly known as:  NORVASC  Take 2.5 mg by mouth daily.     aspirin 81 MG tablet  Take 81 mg by mouth daily.     butalbital-acetaminophen-caffeine 50-325-40 MG per tablet  Commonly known as:  FIORICET, ESGIC  Take 1 tablet by mouth as needed for  headache or migraine.     calcium citrate 950 MG tablet  Commonly known as:  CALCITRATE - dosed in mg elemental calcium  Take 1 tablet (200 mg of elemental calcium total) by mouth 2 (two) times daily.     cholecalciferol 1000 UNITS tablet  Commonly known as:  VITAMIN D  Take 1 tablet (1,000 Units total) by mouth 2 (two) times daily.     citalopram 20 MG tablet  Commonly known as:  CELEXA  Take 20 mg by mouth every evening.     cloNIDine 0.1 MG tablet  Commonly known as:  CATAPRES  Take 0.1 mg by mouth as needed (for systolic BP >884).     enoxaparin 40 MG/0.4ML injection  Commonly known as:  LOVENOX  Inject 0.4 mLs (40 mg total) into the skin daily.     fexofenadine 180 MG tablet  Commonly known as:  ALLEGRA  Take 1 tablet (180 mg total) by mouth daily.     HYDROcodone-acetaminophen 7.5-325 MG  per tablet  Commonly known as:  NORCO  Take 1-2 tablets by mouth every 6 (six) hours as needed for moderate pain.     mesalamine 250 MG CR capsule  Commonly known as:  PENTASA  Take 500 mg by mouth every evening.     methocarbamol 500 MG tablet  Commonly known as:  ROBAXIN  Take 1-2 tablets (500-1,000 mg total) by mouth every 6 (six) hours as needed for muscle spasms.     omeprazole 20 MG tablet  Commonly known as:  PRILOSEC OTC  Take 20 mg by mouth daily.     oxyCODONE 5 MG immediate release tablet  Commonly known as:  Oxy IR/ROXICODONE  Take 1-2 tablets (5-10 mg total) by mouth every 6 (six) hours as needed for breakthrough pain.     pregabalin 75 MG capsule  Commonly known as:  LYRICA  Take 1 capsule (75 mg total) by mouth 2 (two) times daily.     propranolol 80 MG 24 hr capsule  Commonly known as:  INNOPRAN XL  Take 80 mg by mouth 2 (two) times daily.     rosuvastatin 20 MG tablet  Commonly known as:  CRESTOR  Take 20 mg by mouth 2 (two) times a week. Takes on Mon and Fri     triazolam 0.25 MG tablet  Commonly known as:  HALCION  TAKE 1 TABLET AT BEDTIME AS NEEDED      triazolam 0.25 MG tablet  Commonly known as:  HALCION  Take 0.125 mg by mouth at bedtime.           Follow-up Information   Follow up with HANDY,MICHAEL H, MD In 2 weeks. (For suture removal, For wound re-check)    Specialty:  Orthopedic Surgery   Contact information:   [address] [place] [address] [phone]       Signed:  Jari Pigg, PA-C [facility] [phone] (P) 07/01/2014, 10:19 AM

## 2014-07-02 ENCOUNTER — Ambulatory Visit: Payer: Medicare Other | Admitting: Family Medicine

## 2014-07-05 DIAGNOSIS — I10 Essential (primary) hypertension: Secondary | ICD-10-CM | POA: Diagnosis not present

## 2014-07-05 DIAGNOSIS — J449 Chronic obstructive pulmonary disease, unspecified: Secondary | ICD-10-CM | POA: Diagnosis not present

## 2014-07-05 DIAGNOSIS — S82852D Displaced trimalleolar fracture of left lower leg, subsequent encounter for closed fracture with routine healing: Secondary | ICD-10-CM | POA: Diagnosis not present

## 2014-07-05 DIAGNOSIS — R2681 Unsteadiness on feet: Secondary | ICD-10-CM | POA: Diagnosis not present

## 2014-07-06 DIAGNOSIS — I10 Essential (primary) hypertension: Secondary | ICD-10-CM | POA: Diagnosis not present

## 2014-07-06 DIAGNOSIS — R2681 Unsteadiness on feet: Secondary | ICD-10-CM | POA: Diagnosis not present

## 2014-07-06 DIAGNOSIS — S82852D Displaced trimalleolar fracture of left lower leg, subsequent encounter for closed fracture with routine healing: Secondary | ICD-10-CM | POA: Diagnosis not present

## 2014-07-06 DIAGNOSIS — J449 Chronic obstructive pulmonary disease, unspecified: Secondary | ICD-10-CM | POA: Diagnosis not present

## 2014-07-07 DIAGNOSIS — R2681 Unsteadiness on feet: Secondary | ICD-10-CM | POA: Diagnosis not present

## 2014-07-07 DIAGNOSIS — J449 Chronic obstructive pulmonary disease, unspecified: Secondary | ICD-10-CM | POA: Diagnosis not present

## 2014-07-07 DIAGNOSIS — S82852D Displaced trimalleolar fracture of left lower leg, subsequent encounter for closed fracture with routine healing: Secondary | ICD-10-CM | POA: Diagnosis not present

## 2014-07-07 DIAGNOSIS — I10 Essential (primary) hypertension: Secondary | ICD-10-CM | POA: Diagnosis not present

## 2014-07-08 DIAGNOSIS — J449 Chronic obstructive pulmonary disease, unspecified: Secondary | ICD-10-CM | POA: Diagnosis not present

## 2014-07-08 DIAGNOSIS — R2681 Unsteadiness on feet: Secondary | ICD-10-CM | POA: Diagnosis not present

## 2014-07-08 DIAGNOSIS — I10 Essential (primary) hypertension: Secondary | ICD-10-CM | POA: Diagnosis not present

## 2014-07-08 DIAGNOSIS — S82852D Displaced trimalleolar fracture of left lower leg, subsequent encounter for closed fracture with routine healing: Secondary | ICD-10-CM | POA: Diagnosis not present

## 2014-07-09 DIAGNOSIS — S82852D Displaced trimalleolar fracture of left lower leg, subsequent encounter for closed fracture with routine healing: Secondary | ICD-10-CM | POA: Diagnosis not present

## 2014-07-09 DIAGNOSIS — J449 Chronic obstructive pulmonary disease, unspecified: Secondary | ICD-10-CM | POA: Diagnosis not present

## 2014-07-09 DIAGNOSIS — I10 Essential (primary) hypertension: Secondary | ICD-10-CM | POA: Diagnosis not present

## 2014-07-09 DIAGNOSIS — R2681 Unsteadiness on feet: Secondary | ICD-10-CM | POA: Diagnosis not present

## 2014-07-13 DIAGNOSIS — I10 Essential (primary) hypertension: Secondary | ICD-10-CM | POA: Diagnosis not present

## 2014-07-13 DIAGNOSIS — S82852D Displaced trimalleolar fracture of left lower leg, subsequent encounter for closed fracture with routine healing: Secondary | ICD-10-CM | POA: Diagnosis not present

## 2014-07-13 DIAGNOSIS — R2681 Unsteadiness on feet: Secondary | ICD-10-CM | POA: Diagnosis not present

## 2014-07-13 DIAGNOSIS — J449 Chronic obstructive pulmonary disease, unspecified: Secondary | ICD-10-CM | POA: Diagnosis not present

## 2014-07-14 DIAGNOSIS — S82852D Displaced trimalleolar fracture of left lower leg, subsequent encounter for closed fracture with routine healing: Secondary | ICD-10-CM | POA: Diagnosis not present

## 2014-07-14 DIAGNOSIS — R2681 Unsteadiness on feet: Secondary | ICD-10-CM | POA: Diagnosis not present

## 2014-07-14 DIAGNOSIS — I10 Essential (primary) hypertension: Secondary | ICD-10-CM | POA: Diagnosis not present

## 2014-07-14 DIAGNOSIS — J449 Chronic obstructive pulmonary disease, unspecified: Secondary | ICD-10-CM | POA: Diagnosis not present

## 2014-07-15 DIAGNOSIS — S82852A Displaced trimalleolar fracture of left lower leg, initial encounter for closed fracture: Secondary | ICD-10-CM | POA: Diagnosis not present

## 2014-07-16 DIAGNOSIS — J449 Chronic obstructive pulmonary disease, unspecified: Secondary | ICD-10-CM | POA: Diagnosis not present

## 2014-07-16 DIAGNOSIS — I10 Essential (primary) hypertension: Secondary | ICD-10-CM | POA: Diagnosis not present

## 2014-07-16 DIAGNOSIS — R2681 Unsteadiness on feet: Secondary | ICD-10-CM | POA: Diagnosis not present

## 2014-07-16 DIAGNOSIS — S82852D Displaced trimalleolar fracture of left lower leg, subsequent encounter for closed fracture with routine healing: Secondary | ICD-10-CM | POA: Diagnosis not present

## 2014-07-20 DIAGNOSIS — S82852D Displaced trimalleolar fracture of left lower leg, subsequent encounter for closed fracture with routine healing: Secondary | ICD-10-CM | POA: Diagnosis not present

## 2014-07-20 DIAGNOSIS — I10 Essential (primary) hypertension: Secondary | ICD-10-CM | POA: Diagnosis not present

## 2014-07-20 DIAGNOSIS — J449 Chronic obstructive pulmonary disease, unspecified: Secondary | ICD-10-CM | POA: Diagnosis not present

## 2014-07-20 DIAGNOSIS — R2681 Unsteadiness on feet: Secondary | ICD-10-CM | POA: Diagnosis not present

## 2014-07-21 DIAGNOSIS — I10 Essential (primary) hypertension: Secondary | ICD-10-CM | POA: Diagnosis not present

## 2014-07-21 DIAGNOSIS — S82852D Displaced trimalleolar fracture of left lower leg, subsequent encounter for closed fracture with routine healing: Secondary | ICD-10-CM | POA: Diagnosis not present

## 2014-07-21 DIAGNOSIS — J449 Chronic obstructive pulmonary disease, unspecified: Secondary | ICD-10-CM | POA: Diagnosis not present

## 2014-07-21 DIAGNOSIS — R2681 Unsteadiness on feet: Secondary | ICD-10-CM | POA: Diagnosis not present

## 2014-07-22 DIAGNOSIS — S52501A Unspecified fracture of the lower end of right radius, initial encounter for closed fracture: Secondary | ICD-10-CM | POA: Diagnosis not present

## 2014-07-23 DIAGNOSIS — I10 Essential (primary) hypertension: Secondary | ICD-10-CM | POA: Diagnosis not present

## 2014-07-23 DIAGNOSIS — R2681 Unsteadiness on feet: Secondary | ICD-10-CM | POA: Diagnosis not present

## 2014-07-23 DIAGNOSIS — J449 Chronic obstructive pulmonary disease, unspecified: Secondary | ICD-10-CM | POA: Diagnosis not present

## 2014-07-23 DIAGNOSIS — S82852D Displaced trimalleolar fracture of left lower leg, subsequent encounter for closed fracture with routine healing: Secondary | ICD-10-CM | POA: Diagnosis not present

## 2014-07-27 DIAGNOSIS — R2681 Unsteadiness on feet: Secondary | ICD-10-CM | POA: Diagnosis not present

## 2014-07-27 DIAGNOSIS — I10 Essential (primary) hypertension: Secondary | ICD-10-CM | POA: Diagnosis not present

## 2014-07-27 DIAGNOSIS — S82852D Displaced trimalleolar fracture of left lower leg, subsequent encounter for closed fracture with routine healing: Secondary | ICD-10-CM | POA: Diagnosis not present

## 2014-07-27 DIAGNOSIS — J449 Chronic obstructive pulmonary disease, unspecified: Secondary | ICD-10-CM | POA: Diagnosis not present

## 2014-07-28 DIAGNOSIS — R2681 Unsteadiness on feet: Secondary | ICD-10-CM | POA: Diagnosis not present

## 2014-07-28 DIAGNOSIS — J449 Chronic obstructive pulmonary disease, unspecified: Secondary | ICD-10-CM | POA: Diagnosis not present

## 2014-07-28 DIAGNOSIS — I10 Essential (primary) hypertension: Secondary | ICD-10-CM | POA: Diagnosis not present

## 2014-07-28 DIAGNOSIS — S82852D Displaced trimalleolar fracture of left lower leg, subsequent encounter for closed fracture with routine healing: Secondary | ICD-10-CM | POA: Diagnosis not present

## 2014-07-29 DIAGNOSIS — S82852D Displaced trimalleolar fracture of left lower leg, subsequent encounter for closed fracture with routine healing: Secondary | ICD-10-CM | POA: Diagnosis not present

## 2014-07-29 DIAGNOSIS — I10 Essential (primary) hypertension: Secondary | ICD-10-CM | POA: Diagnosis not present

## 2014-07-29 DIAGNOSIS — R2681 Unsteadiness on feet: Secondary | ICD-10-CM | POA: Diagnosis not present

## 2014-07-29 DIAGNOSIS — J449 Chronic obstructive pulmonary disease, unspecified: Secondary | ICD-10-CM | POA: Diagnosis not present

## 2014-07-31 DIAGNOSIS — I10 Essential (primary) hypertension: Secondary | ICD-10-CM | POA: Diagnosis not present

## 2014-07-31 DIAGNOSIS — R2681 Unsteadiness on feet: Secondary | ICD-10-CM | POA: Diagnosis not present

## 2014-07-31 DIAGNOSIS — S82852D Displaced trimalleolar fracture of left lower leg, subsequent encounter for closed fracture with routine healing: Secondary | ICD-10-CM | POA: Diagnosis not present

## 2014-07-31 DIAGNOSIS — J449 Chronic obstructive pulmonary disease, unspecified: Secondary | ICD-10-CM | POA: Diagnosis not present

## 2014-08-03 DIAGNOSIS — S82852D Displaced trimalleolar fracture of left lower leg, subsequent encounter for closed fracture with routine healing: Secondary | ICD-10-CM | POA: Diagnosis not present

## 2014-08-03 DIAGNOSIS — S52501A Unspecified fracture of the lower end of right radius, initial encounter for closed fracture: Secondary | ICD-10-CM | POA: Diagnosis not present

## 2014-08-04 ENCOUNTER — Telehealth: Payer: Self-pay | Admitting: Internal Medicine

## 2014-08-04 DIAGNOSIS — R2681 Unsteadiness on feet: Secondary | ICD-10-CM | POA: Diagnosis not present

## 2014-08-04 DIAGNOSIS — J449 Chronic obstructive pulmonary disease, unspecified: Secondary | ICD-10-CM | POA: Diagnosis not present

## 2014-08-04 DIAGNOSIS — S82852D Displaced trimalleolar fracture of left lower leg, subsequent encounter for closed fracture with routine healing: Secondary | ICD-10-CM | POA: Diagnosis not present

## 2014-08-04 DIAGNOSIS — I10 Essential (primary) hypertension: Secondary | ICD-10-CM | POA: Diagnosis not present

## 2014-08-04 MED ORDER — AMLODIPINE BESYLATE 2.5 MG PO TABS: 2.5000 mg | ORAL_TABLET | Freq: Every day | ORAL | Status: DC

## 2014-08-04 NOTE — Telephone Encounter (Signed)
[id number] [place] on amLODipine (NORVASC) 2.5 MG tablet

## 2014-08-04 NOTE — Telephone Encounter (Signed)
Medication sent in. 

## 2014-08-05 ENCOUNTER — Ambulatory Visit: Payer: Medicare Other | Admitting: Gastroenterology

## 2014-08-10 ENCOUNTER — Other Ambulatory Visit: Payer: Self-pay

## 2014-08-10 ENCOUNTER — Telehealth: Payer: Self-pay | Admitting: Family Medicine

## 2014-08-10 NOTE — Telephone Encounter (Signed)
That's fine and let's call someone to fill the December slots.

## 2014-08-10 NOTE — Telephone Encounter (Signed)
Dr. Yong Channel would you like for Julia [name] to create a spot when they both can come together?

## 2014-08-10 NOTE — Telephone Encounter (Signed)
Pt and husband Christia Reading are scheduled to establish care with Dr. Yong Channel on 08/28/14.  However, pt has broken her wrist and leg and is requesting to be rescheduled in January 2016 for both she and her husband as they live in [place] and travel to [place] together.  Please advise if/when both patients can be worked in for appt in January.

## 2014-08-11 ENCOUNTER — Other Ambulatory Visit: Payer: Self-pay

## 2014-08-11 MED ORDER — PROPRANOLOL HCL ER BEADS 80 MG PO CP24: 80.0000 mg | ORAL_CAPSULE | Freq: Two times a day (BID) | ORAL | Status: DC

## 2014-08-11 NOTE — Telephone Encounter (Signed)
Rx request for Propranolol er 60 mg capsule-take 1 capsule by mouth twice a day. Rx sent to pharmacy #[id number].

## 2014-08-12 NOTE — Telephone Encounter (Signed)
Pt and husband are aware.

## 2014-08-18 DIAGNOSIS — M25572 Pain in left ankle and joints of left foot: Secondary | ICD-10-CM | POA: Diagnosis not present

## 2014-08-18 DIAGNOSIS — M79601 Pain in right arm: Secondary | ICD-10-CM | POA: Diagnosis not present

## 2014-08-18 DIAGNOSIS — S82892S Other fracture of left lower leg, sequela: Secondary | ICD-10-CM | POA: Diagnosis not present

## 2014-08-18 DIAGNOSIS — S52501S Unspecified fracture of the lower end of right radius, sequela: Secondary | ICD-10-CM | POA: Diagnosis not present

## 2014-08-19 DIAGNOSIS — S52501D Unspecified fracture of the lower end of right radius, subsequent encounter for closed fracture with routine healing: Secondary | ICD-10-CM | POA: Diagnosis not present

## 2014-08-19 DIAGNOSIS — S82852D Displaced trimalleolar fracture of left lower leg, subsequent encounter for closed fracture with routine healing: Secondary | ICD-10-CM | POA: Diagnosis not present

## 2014-08-20 ENCOUNTER — Telehealth: Payer: Self-pay | Admitting: Internal Medicine

## 2014-08-20 MED ORDER — PROPRANOLOL HCL ER 60 MG PO CP24: 60.0000 mg | ORAL_CAPSULE | Freq: Two times a day (BID) | ORAL | Status: DC

## 2014-08-20 NOTE — Telephone Encounter (Signed)
Pt states she has been taking propranolol 60 mg and went to pharm and received propranolol 80 mg. Was this an error ? [facility] [place]

## 2014-08-20 NOTE — Telephone Encounter (Signed)
Called and spoke with pt; Advised pt that a new rx for propranolol 60 mg bid had been sent to pharmacy.  Pt verbalized understanding.

## 2014-08-20 NOTE — Telephone Encounter (Signed)
Looked in the chart and did not see where medication was changed.  The request I sent on 08/11/14 said propranolol 60 mg.  Pls advise.

## 2014-08-21 DIAGNOSIS — S82892S Other fracture of left lower leg, sequela: Secondary | ICD-10-CM | POA: Diagnosis not present

## 2014-08-21 DIAGNOSIS — M79601 Pain in right arm: Secondary | ICD-10-CM | POA: Diagnosis not present

## 2014-08-21 DIAGNOSIS — S52501S Unspecified fracture of the lower end of right radius, sequela: Secondary | ICD-10-CM | POA: Diagnosis not present

## 2014-08-21 DIAGNOSIS — M25572 Pain in left ankle and joints of left foot: Secondary | ICD-10-CM | POA: Diagnosis not present

## 2014-08-25 DIAGNOSIS — S82892S Other fracture of left lower leg, sequela: Secondary | ICD-10-CM | POA: Diagnosis not present

## 2014-08-25 DIAGNOSIS — M25572 Pain in left ankle and joints of left foot: Secondary | ICD-10-CM | POA: Diagnosis not present

## 2014-08-25 DIAGNOSIS — M79601 Pain in right arm: Secondary | ICD-10-CM | POA: Diagnosis not present

## 2014-08-25 DIAGNOSIS — S52501S Unspecified fracture of the lower end of right radius, sequela: Secondary | ICD-10-CM | POA: Diagnosis not present

## 2014-08-28 ENCOUNTER — Ambulatory Visit: Payer: Medicare Other | Admitting: Family Medicine

## 2014-08-28 DIAGNOSIS — M79601 Pain in right arm: Secondary | ICD-10-CM | POA: Diagnosis not present

## 2014-08-28 DIAGNOSIS — M25572 Pain in left ankle and joints of left foot: Secondary | ICD-10-CM | POA: Diagnosis not present

## 2014-08-28 DIAGNOSIS — S82892S Other fracture of left lower leg, sequela: Secondary | ICD-10-CM | POA: Diagnosis not present

## 2014-08-28 DIAGNOSIS — S52501S Unspecified fracture of the lower end of right radius, sequela: Secondary | ICD-10-CM | POA: Diagnosis not present

## 2014-09-01 DIAGNOSIS — S82892S Other fracture of left lower leg, sequela: Secondary | ICD-10-CM | POA: Diagnosis not present

## 2014-09-01 DIAGNOSIS — S52501S Unspecified fracture of the lower end of right radius, sequela: Secondary | ICD-10-CM | POA: Diagnosis not present

## 2014-09-01 DIAGNOSIS — M79601 Pain in right arm: Secondary | ICD-10-CM | POA: Diagnosis not present

## 2014-09-01 DIAGNOSIS — M25572 Pain in left ankle and joints of left foot: Secondary | ICD-10-CM | POA: Diagnosis not present

## 2014-09-02 DIAGNOSIS — S52501D Unspecified fracture of the lower end of right radius, subsequent encounter for closed fracture with routine healing: Secondary | ICD-10-CM | POA: Diagnosis not present

## 2014-09-02 DIAGNOSIS — S82852D Displaced trimalleolar fracture of left lower leg, subsequent encounter for closed fracture with routine healing: Secondary | ICD-10-CM | POA: Diagnosis not present

## 2014-09-10 DIAGNOSIS — M79601 Pain in right arm: Secondary | ICD-10-CM | POA: Diagnosis not present

## 2014-09-10 DIAGNOSIS — S52501S Unspecified fracture of the lower end of right radius, sequela: Secondary | ICD-10-CM | POA: Diagnosis not present

## 2014-09-10 DIAGNOSIS — M25572 Pain in left ankle and joints of left foot: Secondary | ICD-10-CM | POA: Diagnosis not present

## 2014-09-10 DIAGNOSIS — S82892S Other fracture of left lower leg, sequela: Secondary | ICD-10-CM | POA: Diagnosis not present

## 2014-09-15 DIAGNOSIS — S52501S Unspecified fracture of the lower end of right radius, sequela: Secondary | ICD-10-CM | POA: Diagnosis not present

## 2014-09-15 DIAGNOSIS — S82892S Other fracture of left lower leg, sequela: Secondary | ICD-10-CM | POA: Diagnosis not present

## 2014-09-15 DIAGNOSIS — M25572 Pain in left ankle and joints of left foot: Secondary | ICD-10-CM | POA: Diagnosis not present

## 2014-09-15 DIAGNOSIS — M79601 Pain in right arm: Secondary | ICD-10-CM | POA: Diagnosis not present

## 2014-09-17 DIAGNOSIS — S52501S Unspecified fracture of the lower end of right radius, sequela: Secondary | ICD-10-CM | POA: Diagnosis not present

## 2014-09-17 DIAGNOSIS — S82892S Other fracture of left lower leg, sequela: Secondary | ICD-10-CM | POA: Diagnosis not present

## 2014-09-17 DIAGNOSIS — M79601 Pain in right arm: Secondary | ICD-10-CM | POA: Diagnosis not present

## 2014-09-17 DIAGNOSIS — M25572 Pain in left ankle and joints of left foot: Secondary | ICD-10-CM | POA: Diagnosis not present

## 2014-09-22 DIAGNOSIS — S82892S Other fracture of left lower leg, sequela: Secondary | ICD-10-CM | POA: Diagnosis not present

## 2014-09-22 DIAGNOSIS — S52501S Unspecified fracture of the lower end of right radius, sequela: Secondary | ICD-10-CM | POA: Diagnosis not present

## 2014-09-22 DIAGNOSIS — M25572 Pain in left ankle and joints of left foot: Secondary | ICD-10-CM | POA: Diagnosis not present

## 2014-09-22 DIAGNOSIS — M79601 Pain in right arm: Secondary | ICD-10-CM | POA: Diagnosis not present

## 2014-09-23 DIAGNOSIS — S52501D Unspecified fracture of the lower end of right radius, subsequent encounter for closed fracture with routine healing: Secondary | ICD-10-CM | POA: Diagnosis not present

## 2014-09-23 DIAGNOSIS — S82852D Displaced trimalleolar fracture of left lower leg, subsequent encounter for closed fracture with routine healing: Secondary | ICD-10-CM | POA: Diagnosis not present

## 2014-09-24 DIAGNOSIS — S52501S Unspecified fracture of the lower end of right radius, sequela: Secondary | ICD-10-CM | POA: Diagnosis not present

## 2014-09-24 DIAGNOSIS — M79601 Pain in right arm: Secondary | ICD-10-CM | POA: Diagnosis not present

## 2014-09-24 DIAGNOSIS — S82892S Other fracture of left lower leg, sequela: Secondary | ICD-10-CM | POA: Diagnosis not present

## 2014-09-24 DIAGNOSIS — M25572 Pain in left ankle and joints of left foot: Secondary | ICD-10-CM | POA: Diagnosis not present

## 2014-09-25 DIAGNOSIS — M25572 Pain in left ankle and joints of left foot: Secondary | ICD-10-CM | POA: Diagnosis not present

## 2014-09-25 DIAGNOSIS — S82892S Other fracture of left lower leg, sequela: Secondary | ICD-10-CM | POA: Diagnosis not present

## 2014-09-25 DIAGNOSIS — M79601 Pain in right arm: Secondary | ICD-10-CM | POA: Diagnosis not present

## 2014-09-25 DIAGNOSIS — S52501S Unspecified fracture of the lower end of right radius, sequela: Secondary | ICD-10-CM | POA: Diagnosis not present

## 2014-09-29 DIAGNOSIS — M25572 Pain in left ankle and joints of left foot: Secondary | ICD-10-CM | POA: Diagnosis not present

## 2014-09-29 DIAGNOSIS — S52501S Unspecified fracture of the lower end of right radius, sequela: Secondary | ICD-10-CM | POA: Diagnosis not present

## 2014-09-29 DIAGNOSIS — M79601 Pain in right arm: Secondary | ICD-10-CM | POA: Diagnosis not present

## 2014-09-29 DIAGNOSIS — S82892S Other fracture of left lower leg, sequela: Secondary | ICD-10-CM | POA: Diagnosis not present

## 2014-10-01 ENCOUNTER — Encounter: Payer: Self-pay | Admitting: Family Medicine

## 2014-10-01 ENCOUNTER — Ambulatory Visit (INDEPENDENT_AMBULATORY_CARE_PROVIDER_SITE_OTHER): Payer: Medicare Other | Admitting: Family Medicine

## 2014-10-01 VITALS — BP 142/84 | Temp 97.8°F | Wt 160.0 lb

## 2014-10-01 DIAGNOSIS — I10 Essential (primary) hypertension: Secondary | ICD-10-CM

## 2014-10-01 DIAGNOSIS — F32A Depression, unspecified: Secondary | ICD-10-CM

## 2014-10-01 DIAGNOSIS — Z23 Encounter for immunization: Secondary | ICD-10-CM | POA: Diagnosis not present

## 2014-10-01 DIAGNOSIS — E785 Hyperlipidemia, unspecified: Secondary | ICD-10-CM

## 2014-10-01 DIAGNOSIS — F329 Major depressive disorder, single episode, unspecified: Secondary | ICD-10-CM

## 2014-10-01 DIAGNOSIS — K219 Gastro-esophageal reflux disease without esophagitis: Secondary | ICD-10-CM | POA: Insufficient documentation

## 2014-10-01 DIAGNOSIS — J45991 Cough variant asthma: Secondary | ICD-10-CM | POA: Diagnosis not present

## 2014-10-01 DIAGNOSIS — J309 Allergic rhinitis, unspecified: Secondary | ICD-10-CM | POA: Insufficient documentation

## 2014-10-01 DIAGNOSIS — I69359 Hemiplegia and hemiparesis following cerebral infarction affecting unspecified side: Secondary | ICD-10-CM | POA: Insufficient documentation

## 2014-10-01 DIAGNOSIS — M8580 Other specified disorders of bone density and structure, unspecified site: Secondary | ICD-10-CM | POA: Insufficient documentation

## 2014-10-01 MED ORDER — OMEPRAZOLE MAGNESIUM 20 MG PO TBEC: 20.0000 mg | DELAYED_RELEASE_TABLET | Freq: Every day | ORAL | Status: DC

## 2014-10-01 MED ORDER — BECLOMETHASONE DIPROPIONATE 80 MCG/ACT IN AERS: 1.0000 | INHALATION_SPRAY | Freq: Two times a day (BID) | RESPIRATORY_TRACT | Status: DC

## 2014-10-01 MED ORDER — CLONIDINE HCL 0.1 MG PO TABS: 0.1000 mg | ORAL_TABLET | ORAL | Status: DC | PRN

## 2014-10-01 MED ORDER — HYDROCODONE-ACETAMINOPHEN 7.5-325 MG PO TABS: 1.0000 | ORAL_TABLET | Freq: Four times a day (QID) | ORAL | Status: DC | PRN

## 2014-10-01 NOTE — Assessment & Plan Note (Signed)
Very mild poor control today, controlled on last visit. Has clonidine prn-advised to avoid triggers for elevated Bp including not eating. Follow up within a few weeks and if SBP >140 plan to increase amlodipine to 19m

## 2014-10-01 NOTE — Assessment & Plan Note (Signed)
Albuterol prn, no nighttime, seasonally uses up to twice a day such as in winter and fall, in the summer uses about once a week. Discussed with poor control at present would start qvar at low dose and see if this improves her daily symptoms, may need to titrate higher at next visit.

## 2014-10-01 NOTE — Assessment & Plan Note (Signed)
We discussed titrating down but wanted to wait until patient more mobile after healing from ankle fracture. No changes today continue  elexa 32m

## 2014-10-01 NOTE — Patient Instructions (Addendum)
For asthma in fall and winter, advise qvar twice a day to help control symptoms. If you are still having to use albuterol more than twice a week in 3-4 weeks, please come see me and we can discuss increasing therapy.   No changes to celexa until healed from ankle fracture  Refilled clonidine. BP just a hair high, recheck next visit

## 2014-10-01 NOTE — Progress Notes (Signed)
Julia Reddish, MD Phone: [phone]  Subjective:  Patient presents today to establish care with me as their new primary care provider. Patient was formerly a patient of Dr. Arnoldo Morale. Chief complaint-noted.   Hypertension-mild poor control  BP Readings from Last 3 Encounters:  10/01/14 142/84  07/01/14 125/77  01/02/14 148/90  Home BP monitoring-upper 120s over high 70s, spikes oftentimes when doesn't eat In 6 months has taken 2 half pills.  Compliant with medications-yes without side effects ROS-Denies any CP, HA, SOB, blurry vision (no increase from baseline vision issues), LE edema  Asthma, poor control During winter months uses albuterol twice a day everyday. Some nighttime coughing spells. Never been on controller.  ROS-no chest pain, minimal chest tightness as long as takes albuteorl, minimal shortness of breath  Depression, good control Patient is interested in coming off of citalopram. Denies depression before her stroke and has been stable for some time. She does admit to being slightly more down with Ankle fracture nonweightbearing 9 weeks.  ROS-no SI/HI  Hyperlipidemia-controlled  Lab Results  Component Value Date   LDLCALC 80 01/02/2014  On statin: crestor 54m twice a week Regular exercise: no, especially with recent ankle fracture ROS- no chest pain or shortness of breath. No myalgias  Past medical history: Patient Active Problem List   Diagnosis Date Noted  . Hemorrhagic stroke 10/01/2014    Priority: High  . Osteoporosis 10/01/2014    Priority: Medium  . Depression 10/19/2010    Priority: Medium  . Hyperlipidemia 06/29/2010    Priority: Medium  . Cough variant asthma 09/15/2009    Priority: Medium  . CEREBRAL ANEURYSM 04/29/2008    Priority: Medium  . Insomnia 11/28/2007    Priority: Medium  . Migraine 04/05/2007    Priority: Medium  . Essential hypertension 04/05/2007    Priority: Medium  . Crohn's disease 04/05/2007    Priority: Medium  .  Allergic rhinitis 10/01/2014    Priority: Low  . GERD (gastroesophageal reflux disease) 10/01/2014    Priority: Low   Medications- reviewed and updated Current Outpatient Prescriptions  Medication Sig Dispense Refill  . albuterol (PROVENTIL HFA;VENTOLIN HFA) 108 (90 BASE) MCG/ACT inhaler Inhale 2 puffs into the lungs every 6 (six) hours as needed for wheezing or shortness of breath.    .Marland KitchenamLODipine (NORVASC) 2.5 MG tablet Take 1 tablet (2.5 mg total) by mouth daily. 30 tablet 1  . aspirin 81 MG tablet Take 81 mg by mouth daily.    . butalbital-acetaminophen-caffeine (FIORICET, ESGIC) 50-325-40 MG per tablet Take 1 tablet by mouth as needed for headache or migraine.    . calcium citrate (CALCITRATE - DOSED IN MG ELEMENTAL CALCIUM) 950 MG tablet Take 1 tablet (200 mg of elemental calcium total) by mouth 2 (two) times daily.    . cholecalciferol (VITAMIN D) 1000 UNITS tablet Take 1 tablet (1,000 Units total) by mouth 2 (two) times daily.    . citalopram (CELEXA) 20 MG tablet Take 20 mg by mouth every evening.    . cloNIDine (CATAPRES) 0.1 MG tablet Take 1 tablet (0.1 mg total) by mouth as needed (for systolic BP >>941. 60 tablet 0  . fexofenadine (ALLEGRA) 180 MG tablet Take 1 tablet (180 mg total) by mouth daily. 30 tablet 11  . mesalamine (PENTASA) 250 MG CR capsule Take 500 mg by mouth every evening.    .Marland Kitchenomeprazole (PRILOSEC OTC) 20 MG tablet Take 1 tablet (20 mg total) by mouth daily. 90 tablet 3  . propranolol ER (INDERAL  LA) 60 MG 24 hr capsule Take 1 capsule (60 mg total) by mouth 2 (two) times daily. 180 capsule 1  . rosuvastatin (CRESTOR) 20 MG tablet Take 20 mg by mouth 2 (two) times a week. Takes on Mon and Fri    . triazolam (HALCION) 0.25 MG tablet TAKE 1 TABLET AT BEDTIME AS NEEDED 30 tablet 5  . HYDROcodone-acetaminophen (NORCO) 7.5-325 MG per tablet Take 1 tablet by mouth every 6 (six) hours as needed for moderate pain (actually 5-325 4 days a week as needed usually for ankle pain  after fracture). 1 tablet 0   ROS--See HPI   Objective: BP 142/84 mmHg  Temp(Src) 97.8 F (36.6 C)  Wt 160 lb (72.576 kg) Gen: NAD, resting comfortably, walks with walker CV: RRR no murmurs rubs or gallops Lungs: CTAB no crackles, wheeze, rhonchi Abdomen: soft/nontender/nondistended/normal bowel sounds.  Ext: 1+ pitting edema left leg (side of ankle fracture), right no swelling Skin: warm, dry Neuro: grossly normal, moves all extremities   Assessment/Plan:  Essential hypertension Very mild poor control today, controlled on last visit. Has clonidine prn-advised to avoid triggers for elevated Bp including not eating. Follow up within a few weeks and if SBP >140 plan to increase amlodipine to 34m   Cough variant asthma Albuterol prn, no nighttime, seasonally uses up to twice a day such as in winter and fall, in the summer uses about once a week. Discussed with poor control at present would start qvar at low dose and see if this improves her daily symptoms, may need to titrate higher at next visit.    Depression We discussed titrating down but wanted to wait until patient more mobile after healing from ankle fracture. No changes today continue  elexa 223m  Hyperlipidemia Controlled on Crestor 2068mwice a week. Repeat lipids yearly.     Return precautions advised. Follow up within a few weeks for BP, asthma, and continue review of history.   Orders Placed This Encounter  Procedures  . Pneumococcal conjugate vaccine 13-valent IM    Meds ordered this encounter  Medications  . cloNIDine (CATAPRES) 0.1 MG tablet    Sig: Take 1 tablet (0.1 mg total) by mouth as needed (for systolic BP >17>957   Dispense:  60 tablet    Refill:  0  . omeprazole (PRILOSEC OTC) 20 MG tablet    Sig: Take 1 tablet (20 mg total) by mouth daily.    Dispense:  90 tablet    Refill:  3  . beclomethasone (QVAR) 80 MCG/ACT inhaler    Sig: Inhale 1 puff into the lungs 2 (two) times daily.     Dispense:  1 Inhaler    Refill:  12  . HYDROcodone-acetaminophen (NORCO) 7.5-325 MG per tablet    Sig: Take 1 tablet by mouth every 6 (six) hours as needed for moderate pain (actually 5-325 4 days a week as needed usually for ankle pain after fracture).    Dispense:  1 tablet    Refill:  0

## 2014-10-01 NOTE — Assessment & Plan Note (Signed)
Controlled on Crestor 45m twice a week. Repeat lipids yearly.

## 2014-10-06 DIAGNOSIS — S52501S Unspecified fracture of the lower end of right radius, sequela: Secondary | ICD-10-CM | POA: Diagnosis not present

## 2014-10-06 DIAGNOSIS — S82892S Other fracture of left lower leg, sequela: Secondary | ICD-10-CM | POA: Diagnosis not present

## 2014-10-06 DIAGNOSIS — M79601 Pain in right arm: Secondary | ICD-10-CM | POA: Diagnosis not present

## 2014-10-06 DIAGNOSIS — M25572 Pain in left ankle and joints of left foot: Secondary | ICD-10-CM | POA: Diagnosis not present

## 2014-10-08 DIAGNOSIS — M25572 Pain in left ankle and joints of left foot: Secondary | ICD-10-CM | POA: Diagnosis not present

## 2014-10-08 DIAGNOSIS — S82892S Other fracture of left lower leg, sequela: Secondary | ICD-10-CM | POA: Diagnosis not present

## 2014-10-08 DIAGNOSIS — M79601 Pain in right arm: Secondary | ICD-10-CM | POA: Diagnosis not present

## 2014-10-08 DIAGNOSIS — S52501S Unspecified fracture of the lower end of right radius, sequela: Secondary | ICD-10-CM | POA: Diagnosis not present

## 2014-10-09 ENCOUNTER — Other Ambulatory Visit: Payer: Self-pay | Admitting: Family Medicine

## 2014-10-14 ENCOUNTER — Other Ambulatory Visit: Payer: Self-pay

## 2014-10-14 MED ORDER — TRIAZOLAM 0.25 MG PO TABS: 0.2500 mg | ORAL_TABLET | Freq: Every evening | ORAL | Status: DC | PRN

## 2014-10-14 NOTE — Telephone Encounter (Signed)
Medication called in 

## 2014-10-14 NOTE — Telephone Encounter (Signed)
Yes x 6 months

## 2014-10-14 NOTE — Telephone Encounter (Signed)
Is this ok to refill?  

## 2014-10-14 NOTE — Telephone Encounter (Signed)
Rx request for Triazolam 0.25 mg tablet-Take 1 tablet by mouth at bedtime as needed  [facility]

## 2014-10-16 DIAGNOSIS — M79601 Pain in right arm: Secondary | ICD-10-CM | POA: Diagnosis not present

## 2014-10-16 DIAGNOSIS — M25572 Pain in left ankle and joints of left foot: Secondary | ICD-10-CM | POA: Diagnosis not present

## 2014-10-16 DIAGNOSIS — S82892S Other fracture of left lower leg, sequela: Secondary | ICD-10-CM | POA: Diagnosis not present

## 2014-10-16 DIAGNOSIS — S52501S Unspecified fracture of the lower end of right radius, sequela: Secondary | ICD-10-CM | POA: Diagnosis not present

## 2014-10-20 DIAGNOSIS — M79601 Pain in right arm: Secondary | ICD-10-CM | POA: Diagnosis not present

## 2014-10-20 DIAGNOSIS — S82892S Other fracture of left lower leg, sequela: Secondary | ICD-10-CM | POA: Diagnosis not present

## 2014-10-20 DIAGNOSIS — M25572 Pain in left ankle and joints of left foot: Secondary | ICD-10-CM | POA: Diagnosis not present

## 2014-10-20 DIAGNOSIS — S52501S Unspecified fracture of the lower end of right radius, sequela: Secondary | ICD-10-CM | POA: Diagnosis not present

## 2014-10-22 DIAGNOSIS — M79601 Pain in right arm: Secondary | ICD-10-CM | POA: Diagnosis not present

## 2014-10-22 DIAGNOSIS — M25572 Pain in left ankle and joints of left foot: Secondary | ICD-10-CM | POA: Diagnosis not present

## 2014-10-22 DIAGNOSIS — S82892S Other fracture of left lower leg, sequela: Secondary | ICD-10-CM | POA: Diagnosis not present

## 2014-10-22 DIAGNOSIS — S52501S Unspecified fracture of the lower end of right radius, sequela: Secondary | ICD-10-CM | POA: Diagnosis not present

## 2014-10-26 ENCOUNTER — Other Ambulatory Visit: Payer: Self-pay

## 2014-10-26 MED ORDER — CITALOPRAM HYDROBROMIDE 20 MG PO TABS: 20.0000 mg | ORAL_TABLET | Freq: Every evening | ORAL | Status: DC

## 2014-10-26 NOTE — Telephone Encounter (Signed)
Rx request for Citalopram HBR 20 mg tablet-Take 1 tablet by mouth every day #30  [facility] [place]

## 2014-10-27 ENCOUNTER — Other Ambulatory Visit: Payer: Self-pay

## 2014-10-27 DIAGNOSIS — Z1231 Encounter for screening mammogram for malignant neoplasm of breast: Secondary | ICD-10-CM

## 2014-10-30 DIAGNOSIS — S82892S Other fracture of left lower leg, sequela: Secondary | ICD-10-CM | POA: Diagnosis not present

## 2014-10-30 DIAGNOSIS — S52501S Unspecified fracture of the lower end of right radius, sequela: Secondary | ICD-10-CM | POA: Diagnosis not present

## 2014-10-30 DIAGNOSIS — M79601 Pain in right arm: Secondary | ICD-10-CM | POA: Diagnosis not present

## 2014-10-30 DIAGNOSIS — M25572 Pain in left ankle and joints of left foot: Secondary | ICD-10-CM | POA: Diagnosis not present

## 2014-11-02 DIAGNOSIS — S82852D Displaced trimalleolar fracture of left lower leg, subsequent encounter for closed fracture with routine healing: Secondary | ICD-10-CM | POA: Diagnosis not present

## 2014-11-02 DIAGNOSIS — S52501D Unspecified fracture of the lower end of right radius, subsequent encounter for closed fracture with routine healing: Secondary | ICD-10-CM | POA: Diagnosis not present

## 2014-11-11 ENCOUNTER — Ambulatory Visit: Payer: Medicare Other | Admitting: Family Medicine

## 2014-11-16 DIAGNOSIS — K449 Diaphragmatic hernia without obstruction or gangrene: Secondary | ICD-10-CM | POA: Diagnosis not present

## 2014-11-16 DIAGNOSIS — Z886 Allergy status to analgesic agent status: Secondary | ICD-10-CM | POA: Diagnosis not present

## 2014-11-16 DIAGNOSIS — K5730 Diverticulosis of large intestine without perforation or abscess without bleeding: Secondary | ICD-10-CM | POA: Diagnosis not present

## 2014-11-16 DIAGNOSIS — K648 Other hemorrhoids: Secondary | ICD-10-CM | POA: Diagnosis not present

## 2014-11-16 DIAGNOSIS — Z90710 Acquired absence of both cervix and uterus: Secondary | ICD-10-CM | POA: Diagnosis not present

## 2014-11-16 DIAGNOSIS — K5190 Ulcerative colitis, unspecified, without complications: Secondary | ICD-10-CM | POA: Diagnosis not present

## 2014-11-16 DIAGNOSIS — K2950 Unspecified chronic gastritis without bleeding: Secondary | ICD-10-CM | POA: Diagnosis not present

## 2014-11-16 DIAGNOSIS — I10 Essential (primary) hypertension: Secondary | ICD-10-CM | POA: Diagnosis not present

## 2014-11-16 DIAGNOSIS — J45909 Unspecified asthma, uncomplicated: Secondary | ICD-10-CM | POA: Diagnosis not present

## 2014-11-16 DIAGNOSIS — Z9049 Acquired absence of other specified parts of digestive tract: Secondary | ICD-10-CM | POA: Diagnosis not present

## 2014-11-16 DIAGNOSIS — K5090 Crohn's disease, unspecified, without complications: Secondary | ICD-10-CM | POA: Diagnosis not present

## 2014-11-16 DIAGNOSIS — Z880 Allergy status to penicillin: Secondary | ICD-10-CM | POA: Diagnosis not present

## 2014-11-16 LAB — HM COLONOSCOPY: HM Colonoscopy: NORMAL

## 2014-11-17 LAB — HM DEXA SCAN

## 2014-11-19 DIAGNOSIS — H04123 Dry eye syndrome of bilateral lacrimal glands: Secondary | ICD-10-CM | POA: Diagnosis not present

## 2014-11-19 DIAGNOSIS — Z961 Presence of intraocular lens: Secondary | ICD-10-CM | POA: Diagnosis not present

## 2014-11-19 DIAGNOSIS — H524 Presbyopia: Secondary | ICD-10-CM | POA: Diagnosis not present

## 2014-11-19 DIAGNOSIS — H11153 Pinguecula, bilateral: Secondary | ICD-10-CM | POA: Diagnosis not present

## 2014-11-19 DIAGNOSIS — H5202 Hypermetropia, left eye: Secondary | ICD-10-CM | POA: Diagnosis not present

## 2014-11-19 DIAGNOSIS — Z9841 Cataract extraction status, right eye: Secondary | ICD-10-CM | POA: Diagnosis not present

## 2014-11-19 DIAGNOSIS — H26492 Other secondary cataract, left eye: Secondary | ICD-10-CM | POA: Diagnosis not present

## 2014-11-19 DIAGNOSIS — H52222 Regular astigmatism, left eye: Secondary | ICD-10-CM | POA: Diagnosis not present

## 2014-11-23 DIAGNOSIS — K5090 Crohn's disease, unspecified, without complications: Secondary | ICD-10-CM | POA: Diagnosis not present

## 2014-11-23 DIAGNOSIS — R1319 Other dysphagia: Secondary | ICD-10-CM | POA: Diagnosis not present

## 2014-11-23 DIAGNOSIS — Z92241 Personal history of systemic steroid therapy: Secondary | ICD-10-CM | POA: Diagnosis not present

## 2014-11-23 DIAGNOSIS — K219 Gastro-esophageal reflux disease without esophagitis: Secondary | ICD-10-CM | POA: Diagnosis not present

## 2014-11-23 LAB — BASIC METABOLIC PANEL
BUN: 15 mg/dL (ref 4–21)
CREATININE: 0.7 mg/dL (ref <=1.1)
GLUCOSE: 81 mg/dL
Potassium: 4.7 mmol/L (ref 3.4–5.3)
SODIUM: 142 mmol/L (ref 137–147)

## 2014-11-23 LAB — CBC AND DIFFERENTIAL
HCT: 43 % (ref 36–46)
Hemoglobin: 14.4 g/dL (ref 12.0–16.0)
NEUTROS ABS: 3 /uL
PLATELETS: 204 10*3/uL (ref 150–399)
WBC: 5.3 10^3/mL

## 2014-11-23 LAB — HEPATIC FUNCTION PANEL
ALT: 18 U/L (ref 7–35)
AST: 16 U/L (ref 13–35)
BILIRUBIN, TOTAL: 0.2 mg/dL

## 2014-11-24 DIAGNOSIS — M8580 Other specified disorders of bone density and structure, unspecified site: Secondary | ICD-10-CM | POA: Diagnosis not present

## 2014-11-26 DIAGNOSIS — K5090 Crohn's disease, unspecified, without complications: Secondary | ICD-10-CM | POA: Diagnosis not present

## 2014-11-30 ENCOUNTER — Telehealth: Payer: Self-pay | Admitting: Family Medicine

## 2014-11-30 ENCOUNTER — Encounter: Payer: Self-pay | Admitting: Family Medicine

## 2014-11-30 ENCOUNTER — Other Ambulatory Visit: Payer: Self-pay

## 2014-11-30 MED ORDER — OMEPRAZOLE MAGNESIUM 20 MG PO TBEC: 20.0000 mg | DELAYED_RELEASE_TABLET | Freq: Two times a day (BID) | ORAL | Status: DC

## 2014-11-30 NOTE — Telephone Encounter (Signed)
See below

## 2014-11-30 NOTE — Telephone Encounter (Signed)
Change to 69m once daily please. Thanks!

## 2014-11-30 NOTE — Telephone Encounter (Signed)
I received a PA request for omeprazole 20 mg, 60 per 30.  I called to submit a quantity limit PA and was advised by the patient's plan that omeprazole 40 mg, quantity 30 per 30 would be approved and not requiring an authorization.  Her plan has a quantity limit on PPI's. Please advise if the medication can be changed or if you want me to submit a quantity limit request for medication.

## 2014-12-01 DIAGNOSIS — K5090 Crohn's disease, unspecified, without complications: Secondary | ICD-10-CM | POA: Diagnosis not present

## 2014-12-01 DIAGNOSIS — N8320 Unspecified ovarian cysts: Secondary | ICD-10-CM | POA: Diagnosis not present

## 2014-12-01 DIAGNOSIS — D1771 Benign lipomatous neoplasm of kidney: Secondary | ICD-10-CM | POA: Diagnosis not present

## 2014-12-01 DIAGNOSIS — N200 Calculus of kidney: Secondary | ICD-10-CM | POA: Diagnosis not present

## 2014-12-01 MED ORDER — OMEPRAZOLE 40 MG PO CPDR: 40.0000 mg | DELAYED_RELEASE_CAPSULE | Freq: Every day | ORAL | Status: DC

## 2014-12-01 NOTE — Telephone Encounter (Signed)
Julia [name] can you please send in the new rx.  See below.  thanks

## 2014-12-01 NOTE — Telephone Encounter (Signed)
New Rx sent in

## 2014-12-04 DIAGNOSIS — T380X5A Adverse effect of glucocorticoids and synthetic analogues, initial encounter: Secondary | ICD-10-CM | POA: Diagnosis not present

## 2014-12-04 DIAGNOSIS — R195 Other fecal abnormalities: Secondary | ICD-10-CM | POA: Diagnosis not present

## 2014-12-04 DIAGNOSIS — K5090 Crohn's disease, unspecified, without complications: Secondary | ICD-10-CM | POA: Diagnosis not present

## 2014-12-04 DIAGNOSIS — R1084 Generalized abdominal pain: Secondary | ICD-10-CM | POA: Diagnosis not present

## 2014-12-07 ENCOUNTER — Other Ambulatory Visit: Payer: Self-pay | Admitting: Family Medicine

## 2014-12-14 ENCOUNTER — Encounter: Payer: Self-pay | Admitting: Family Medicine

## 2014-12-21 ENCOUNTER — Ambulatory Visit
Admission: RE | Admit: 2014-12-21 | Discharge: 2014-12-21 | Disposition: A | Discharge status: Home / Self Care | Payer: Medicare Other | Source: Ambulatory Visit | Attending: Orthopedic Surgery | Admitting: Orthopedic Surgery

## 2014-12-21 ENCOUNTER — Other Ambulatory Visit: Payer: Self-pay | Admitting: Orthopedic Surgery

## 2014-12-21 DIAGNOSIS — M25572 Pain in left ankle and joints of left foot: Secondary | ICD-10-CM

## 2014-12-21 DIAGNOSIS — S82852D Displaced trimalleolar fracture of left lower leg, subsequent encounter for closed fracture with routine healing: Secondary | ICD-10-CM | POA: Diagnosis not present

## 2014-12-21 DIAGNOSIS — M19172 Post-traumatic osteoarthritis, left ankle and foot: Secondary | ICD-10-CM | POA: Diagnosis not present

## 2014-12-21 DIAGNOSIS — S52501D Unspecified fracture of the lower end of right radius, subsequent encounter for closed fracture with routine healing: Secondary | ICD-10-CM | POA: Diagnosis not present

## 2014-12-21 DIAGNOSIS — M25472 Effusion, left ankle: Secondary | ICD-10-CM

## 2014-12-22 ENCOUNTER — Encounter: Payer: Self-pay | Admitting: Family Medicine

## 2014-12-22 ENCOUNTER — Ambulatory Visit (INDEPENDENT_AMBULATORY_CARE_PROVIDER_SITE_OTHER): Payer: Medicare Other | Admitting: Family Medicine

## 2014-12-22 DIAGNOSIS — M8580 Other specified disorders of bone density and structure, unspecified site: Secondary | ICD-10-CM

## 2014-12-22 DIAGNOSIS — F329 Major depressive disorder, single episode, unspecified: Secondary | ICD-10-CM

## 2014-12-22 DIAGNOSIS — G4719 Other hypersomnia: Secondary | ICD-10-CM

## 2014-12-22 DIAGNOSIS — J45991 Cough variant asthma: Secondary | ICD-10-CM

## 2014-12-22 DIAGNOSIS — G4730 Sleep apnea, unspecified: Secondary | ICD-10-CM

## 2014-12-22 DIAGNOSIS — F32A Depression, unspecified: Secondary | ICD-10-CM

## 2014-12-22 DIAGNOSIS — R0683 Snoring: Secondary | ICD-10-CM

## 2014-12-22 MED ORDER — ROSUVASTATIN CALCIUM 20 MG PO TABS: 20.0000 mg | ORAL_TABLET | ORAL | Status: DC

## 2014-12-22 NOTE — Assessment & Plan Note (Signed)
S: Added qvar. No albuterol since starting qvar. Usually uses albuterol once a week in summer A/P: controlled at this time, she can take albuterol only prn in summer if she would like but if symptoms increase to need albuterol more than twice a week, will restart qvar.

## 2014-12-22 NOTE — Progress Notes (Signed)
Garret Reddish, MD Phone: [phone]  Subjective:   Julia [name] is a 64 y.o. year old very pleasant female patient who presents with the following:  See problem oriented charting ROS- no chest pain or shortness of breath. No night sweats or unintentional weight loss. No SI/HI  Past Medical History- Patient Active Problem List   Diagnosis Date Noted  . Hemorrhagic stroke 10/01/2014    Priority: High  . Crohn's disease 04/05/2007    Priority: High  . Osteopenia 10/01/2014    Priority: Medium  . Depression 10/19/2010    Priority: Medium  . Hyperlipidemia 06/29/2010    Priority: Medium  . Cough variant asthma 09/15/2009    Priority: Medium  . CEREBRAL ANEURYSM 04/29/2008    Priority: Medium  . Insomnia 11/28/2007    Priority: Medium  . Migraine 04/05/2007    Priority: Medium  . Essential hypertension 04/05/2007    Priority: Medium  . Sleep apnea 04/05/2007    Priority: Medium  . Allergic rhinitis 10/01/2014    Priority: Low  . Trimalleolar fracture of left ankle 06/26/2014    Priority: Low  . DIZZINESS, CHRONIC 10/26/2009    Priority: Low  . Lumbar back pain with radiculopathy affecting left lower extremity 09/21/2008    Priority: Low  . 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 GERD 04/05/2007    Priority: Low   Medications- reviewed and updated Current Outpatient Prescriptions  Medication Sig Dispense Refill  . albuterol (PROVENTIL HFA;VENTOLIN HFA) 108 (90 BASE) MCG/ACT inhaler Inhale 2 puffs into the lungs every 6 (six) hours as needed for wheezing or shortness of breath.    Marland Kitchen amLODipine (NORVASC) 2.5 MG tablet TAKE 1 TABLET BY MOUTH EVERY DAY 30 tablet 5  . aspirin 81 MG tablet Take 81 mg by mouth daily.    . beclomethasone (QVAR) 80 MCG/ACT inhaler Inhale 1 puff into the lungs 2 (two) times daily. 1 Inhaler 12  . calcium citrate (CALCITRATE - DOSED IN MG ELEMENTAL CALCIUM) 950 MG tablet Take 1 tablet (200 mg of  elemental calcium total) by mouth 2 (two) times daily.    . cholecalciferol (VITAMIN D) 1000 UNITS tablet Take 1 tablet (1,000 Units total) by mouth 2 (two) times daily.    . citalopram (CELEXA) 20 MG tablet Take 1 tablet (20 mg total) by mouth every evening. 30 tablet 3  . fexofenadine (ALLEGRA) 180 MG tablet Take 1 tablet (180 mg total) by mouth daily. 30 tablet 11  . mesalamine (PENTASA) 250 MG CR capsule Take 500 mg by mouth 2 (two) times daily.     Marland Kitchen omeprazole (PRILOSEC) 40 MG capsule Take 1 capsule (40 mg total) by mouth daily. 30 capsule 3  . propranolol ER (INDERAL LA) 60 MG 24 hr capsule Take 1 capsule (60 mg total) by mouth 2 (two) times daily. 180 capsule 1  . rosuvastatin (CRESTOR) 20 MG tablet Take 1 tablet (20 mg total) by mouth 2 (two) times a week. Takes on Mon and Fri 30 tablet 3  . triazolam (HALCION) 0.25 MG tablet Take 1 tablet (0.25 mg total) by mouth at bedtime as needed. 30 tablet 5  . butalbital-acetaminophen-caffeine (FIORICET, ESGIC) 50-325-40 MG per tablet Take 1 tablet by mouth as needed for headache or migraine.    . cloNIDine (CATAPRES) 0.1 MG tablet Take 1 tablet (0.1 mg total) by mouth as needed (for systolic BP >009). (Patient not taking: Reported on 12/22/2014) 60 tablet 0   No current  facility-administered medications for this visit.   Objective: BP 118/72 mmHg  Pulse 68  Temp(Src) 98.5 F (36.9 C)  Wt 162 lb (73.483 kg) Gen: NAD, resting comfortably in chair, able to climb onto table withotu assist CV: RRR no murmurs rubs or gallops Lungs: CTAB no crackles, wheeze, rhonchi Abdomen: soft/nontender/nondistended/normal bowel sounds. No rebound or guarding.  Ext: no edema Skin: warm, dry, no rash   Assessment/Plan:  Cough variant asthma S: Added qvar. No albuterol since starting qvar. Usually uses albuterol once a week in summer A/P: controlled at this time, she can take albuterol only prn in summer if she would like but if symptoms increase to need  albuterol more than twice a week, will restart qvar.     [facility]. Reports 3 years fosamax in past. Currently calcium-vit D. Likely related to prior steroids.  A/P: we will obtain records. Could consider fosamax 5 years given 2 recent fractures though both seemed to be from significant falls.     Sleep apnea S: Sleep apneaDiagnosed prior to stroke, reports reevaluated and no longer noted to have sleep apnea. Recurrence of symptoms 2015 into 2016. She reports Fatigue within last year with increase in snoring. She has increasing daytime sleepiness and nods off at times.  A/P:Repeat sleep eval ordered through pulmonology referral.     Depression S: had considered titration off last visit once mobility increased. Despite mobility, patient still struggles with history of stroke.  A/P: agreed to continue celexa 51m    6 month follow up.   Orders Placed This Encounter  Procedures  . Ambulatory referral to Pulmonology    Referral Priority:  Routine    Referral Type:  Consultation    Referral Reason:  Specialty Services Required    Requested Specialty:  Pulmonary Disease    Number of Visits Requested:  1    Meds ordered this encounter  Medications  . rosuvastatin (CRESTOR) 20 MG tablet    Sig: Take 1 tablet (20 mg total) by mouth 2 (two) times a week. Takes on Mon and Fri    Dispense:  30 tablet    Refill:  3

## 2014-12-22 NOTE — Assessment & Plan Note (Signed)
S: Sleep apneaDiagnosed prior to stroke, reports reevaluated and no longer noted to have sleep apnea. Recurrence of symptoms 2015 into 2016. She reports Fatigue within last year with increase in snoring. She has increasing daytime sleepiness and nods off at times.  A/P:Repeat sleep eval ordered through pulmonology referral.

## 2014-12-22 NOTE — Assessment & Plan Note (Signed)
[facility]. Reports 3 years fosamax in past. Currently calcium-vit D. Likely related to prior steroids.  A/P: we will obtain records. Could consider fosamax 5 years given 2 recent fractures though both seemed to be from significant falls.

## 2014-12-22 NOTE — Assessment & Plan Note (Signed)
S: had considered titration off last visit once mobility increased. Despite mobility, patient still struggles with history of stroke.  A/P: agreed to continue celexa 32m

## 2014-12-22 NOTE — Patient Instructions (Addendum)
Glad the breathing issues are better! May stop qvar and if your albuterol usage goes above twice a week, restart. I would restart in November regardless.   Continue celexa  Send for sleep evaluation and potential study  Continue calcium/vit d  Sign release of information at the front desk for a copy of your bone density scan only  Have breast center send [place] a copy of mammogram  See me in 6 months

## 2014-12-24 ENCOUNTER — Encounter: Payer: Self-pay | Admitting: Family Medicine

## 2014-12-28 DIAGNOSIS — H26492 Other secondary cataract, left eye: Secondary | ICD-10-CM | POA: Diagnosis not present

## 2014-12-28 DIAGNOSIS — I10 Essential (primary) hypertension: Secondary | ICD-10-CM | POA: Diagnosis not present

## 2014-12-28 DIAGNOSIS — Z961 Presence of intraocular lens: Secondary | ICD-10-CM | POA: Diagnosis not present

## 2014-12-28 DIAGNOSIS — H26491 Other secondary cataract, right eye: Secondary | ICD-10-CM | POA: Diagnosis not present

## 2015-01-04 ENCOUNTER — Encounter: Payer: Self-pay | Admitting: Family Medicine

## 2015-01-25 ENCOUNTER — Ambulatory Visit: Admit: 2015-01-25 | Payer: Medicare Other | Admitting: Orthopedic Surgery

## 2015-01-25 SURGERY — REMOVAL, HARDWARE
Anesthesia: Choice | Site: Ankle | Laterality: Left

## 2015-01-27 ENCOUNTER — Telehealth: Payer: Self-pay

## 2015-01-27 NOTE — Telephone Encounter (Signed)
[id number] [place] butalbital-acetaminophen-caffeine (FIORICET, ESGIC) 50-325-40 MG per tablet

## 2015-01-28 MED ORDER — BUTALBITAL-APAP-CAFFEINE 50-325-40 MG PO TABS: 1.0000 | ORAL_TABLET | ORAL | Status: DC | PRN

## 2015-01-28 NOTE — Telephone Encounter (Signed)
Ok to refill 

## 2015-01-28 NOTE — Telephone Encounter (Signed)
Yes, considering headache clinic though given regular use to see if we could get her to a better place.

## 2015-01-28 NOTE — Telephone Encounter (Signed)
[facility] [place]

## 2015-02-03 ENCOUNTER — Encounter: Payer: Self-pay | Admitting: Family Medicine

## 2015-02-05 NOTE — Telephone Encounter (Signed)
Message to patient- I am not sure why it was changed. It may have been how the request came across through pharmacy or a decision that our nursing staff made when doing the refill. I did state when I responded to the request for the refill that I was concerned about the frequency of use for a medication like fiorcet which is not the most ideal migraine treatment. That may have been why the quantity and amount per day were adjusted.  I am going to make referral to the headache wellness center to see if they can help find a better, long term safer medication treatment option.   Julia [name] please enter this referral. In the meanttime, I will ask our staff to get you the original quantity and parameters that you had before while we await the referral.

## 2015-02-09 ENCOUNTER — Telehealth: Payer: Self-pay

## 2015-02-09 NOTE — Telephone Encounter (Signed)
error 

## 2015-02-23 ENCOUNTER — Institutional Professional Consult (permissible substitution): Payer: Medicare Other | Admitting: Pulmonary Disease

## 2015-02-25 ENCOUNTER — Encounter: Payer: Self-pay | Admitting: Gastroenterology

## 2015-03-02 ENCOUNTER — Other Ambulatory Visit: Payer: Self-pay | Admitting: Family Medicine

## 2015-03-02 ENCOUNTER — Telehealth: Payer: Self-pay | Admitting: *Deleted

## 2015-03-02 MED ORDER — PROPRANOLOL HCL ER 60 MG PO CP24: 60.0000 mg | ORAL_CAPSULE | Freq: Two times a day (BID) | ORAL | Status: DC

## 2015-03-02 NOTE — Telephone Encounter (Signed)
Medication refilled

## 2015-03-02 NOTE — Telephone Encounter (Signed)
Also a refill of Citalopram 40 mg, 1 tab in the evening

## 2015-03-02 NOTE — Telephone Encounter (Signed)
Patient has moved and is requesting a refill of Propranolol ER 60 mg, 1 tab twice daily. [id number] [id number]  And fax [phone]

## 2015-03-04 ENCOUNTER — Other Ambulatory Visit: Payer: Self-pay | Admitting: Family Medicine

## 2015-04-13 ENCOUNTER — Other Ambulatory Visit: Payer: Self-pay | Admitting: Family Medicine

## 2015-04-13 NOTE — Telephone Encounter (Signed)
Refills ok 

## 2015-04-13 NOTE — Telephone Encounter (Signed)
Yes thanks 

## 2015-04-14 NOTE — Telephone Encounter (Signed)
Refill called in. 

## 2015-04-27 ENCOUNTER — Other Ambulatory Visit: Payer: Self-pay | Admitting: Family Medicine

## 2015-04-27 NOTE — Telephone Encounter (Signed)
Refill ok? 

## 2015-04-27 NOTE — Telephone Encounter (Signed)
Yes thanks 

## 2015-05-03 ENCOUNTER — Encounter (HOSPITAL_COMMUNITY): Payer: Self-pay | Admitting: *Deleted

## 2015-05-03 MED ORDER — CHLORHEXIDINE GLUCONATE 4 % EX LIQD: 60.0000 mL | Freq: Once | CUTANEOUS | Status: DC

## 2015-05-03 MED ORDER — CLINDAMYCIN PHOSPHATE 900 MG/50ML IV SOLN: 900.0000 mg | INTRAVENOUS | Status: DC

## 2015-05-03 NOTE — H&P (Signed)
[facility]     Chief Complaint:  Symptomatic HW L ankle HPI:   64 y/o female s/p ORIF L ankle. She has healed uneventfully. She has persistent pain over her hardware, as well as joint pain.  Pt has tried conservative measures w/o success. Presents today for [facility] and L ankle steroid injection   Past Medical History  Diagnosis Date  . Anal fissure 04/29/2008  . BACK PAIN, LUMBAR, WITH RADICULOPATHY 09/21/2008  . BREAST CYSTS, BILATERAL 10/01/2007  . Carpal tunnel syndrome   . CEREBRAL ANEURYSM   . CHEST DISCOMFORT   . CHOLELITHIASIS   . COLITIS   . COMMON MIGRAINE   . CONSTIPATION   . Cough variant asthma   . CROHN'S DISEASE   . DEGENERATIVE DISC DISEASE, CERVICAL SPINE   . Diarrhea   . DIZZINESS, CHRONIC   . FACIAL PARESTHESIA, LEFT   . GERD   . HIATAL HERNIA   . HYPERLIPIDEMIA, BORDERLINE   . HYPERTENSION   . ORGANIC INSOMNIA UNSPECIFIED   . SLEEP APNEA   . UNSPECIFIED VENOUS INSUFFICIENCY   . CVA (cerebrovascular accident due to intracerebral hemorrhage)   . Trimalleolar fracture of left ankle 06/26/2014  . Dislocation of left ankle joint 06/30/2014  . Bacterial overgrowth syndrome 05/18/2011    In past, crohn's related ,now resolved   . Stroke 200  . Shortness of breath dyspnea     with exertion  . Depression     since CVA  . History of kidney stones   . DDD (degenerative disc disease), cervical   . Osteopenia     Past Surgical History  Procedure Laterality Date  . Breast lumpectomy Right   . Colectomy  1981    right  . Carpal tunnel release Right   . Abdominal hysterectomy    . Tonsillectomy    . External fixation leg Left 06/27/2014    Procedure: ORIF of trimalleolar fracture, without fixation of the posterior lip ;  Surgeon: Rozanna Box, MD;  Location: [facility];  Service: Orthopedics;  Laterality: Left;  . Ankle fracture surgery Left 06/27/2014  . Eye surgery Bilateral     cataract with lens  . Colonoscopy      Family History  Problem  Relation Age of Onset  . Stomach cancer Father   . Esophageal cancer Father   . Colon cancer Neg Hx    Social History:  reports that she quit smoking about 35 years ago. She has never used smokeless tobacco. She reports that she drinks alcohol. She reports that she does not use illicit drugs.  Allergies:  Allergies  Allergen Reactions  . Meperidine Hcl Nausea And Vomiting  . Penicillins Itching and Rash    No prescriptions prior to admission   Same day labs  No results found for this or any previous visit (from the past 48 hour(s)). No results found.  Review of Systems  Constitutional: Negative for fever and chills.  Eyes: Negative for blurred vision.  Respiratory: Negative for shortness of breath and wheezing.   Cardiovascular: Negative for chest pain.  Gastrointestinal: Negative for nausea and vomiting.  Musculoskeletal: Positive for joint pain (L ankle ).  Neurological: Negative for tingling and sensory change.    Vitals on arrival to short stay  Physical Exam  Constitutional: She appears well-developed and well-nourished. No distress.  HENT:  Head: Normocephalic and atraumatic.  Eyes: EOM are normal. Pupils are equal, round, and reactive to light.  Cardiovascular: Normal rate and regular rhythm.  Respiratory: Effort normal and breath sounds normal.  GI: Soft. Bowel sounds are normal.  Musculoskeletal:  L ankle   Surgical wounds stable   No significant swelling     Distal motor and sensory functions intact    Ext warm     + DP pulse    No DCT    Tender over HW and ankle joint      Assessment/Plan  64 y/o female with L ankle arthritis and symptomatic HW L ankle  OR for [facility] L ankle steroid injection outpt procedure Risks and benefits reviewed, pt wishes to proceed  Jari Pigg, PA-C [facility] [phone] (P) 05/03/2015, 10:07 PM

## 2015-05-03 NOTE — Progress Notes (Signed)
Julia [name] reported that when she cant have anything to eat or is stressed, that her blood pressure increases. Patient has a prn for Clonidine.

## 2015-05-04 ENCOUNTER — Ambulatory Visit (HOSPITAL_COMMUNITY): Payer: Medicare Other | Admitting: Certified Registered Nurse Anesthetist

## 2015-05-04 ENCOUNTER — Ambulatory Visit (HOSPITAL_COMMUNITY): Payer: Medicare Other

## 2015-05-04 ENCOUNTER — Ambulatory Visit (HOSPITAL_COMMUNITY)
Admission: RE | Admit: 2015-05-04 | Discharge: 2015-05-04 | Disposition: A | Discharge status: Home / Self Care | Payer: Medicare Other | Source: Ambulatory Visit | Attending: Orthopedic Surgery | Admitting: Orthopedic Surgery

## 2015-05-04 ENCOUNTER — Encounter (HOSPITAL_COMMUNITY)
Admission: RE | Disposition: A | Discharge status: Home / Self Care | Payer: Self-pay | Source: Ambulatory Visit | Attending: Orthopedic Surgery

## 2015-05-04 ENCOUNTER — Encounter (HOSPITAL_COMMUNITY): Payer: Self-pay | Admitting: Certified Registered Nurse Anesthetist

## 2015-05-04 DIAGNOSIS — K219 Gastro-esophageal reflux disease without esophagitis: Secondary | ICD-10-CM | POA: Insufficient documentation

## 2015-05-04 DIAGNOSIS — G4730 Sleep apnea, unspecified: Secondary | ICD-10-CM | POA: Insufficient documentation

## 2015-05-04 DIAGNOSIS — Z885 Allergy status to narcotic agent status: Secondary | ICD-10-CM | POA: Diagnosis not present

## 2015-05-04 DIAGNOSIS — Z419 Encounter for procedure for purposes other than remedying health state, unspecified: Secondary | ICD-10-CM

## 2015-05-04 DIAGNOSIS — M19172 Post-traumatic osteoarthritis, left ankle and foot: Secondary | ICD-10-CM | POA: Diagnosis not present

## 2015-05-04 DIAGNOSIS — I872 Venous insufficiency (chronic) (peripheral): Secondary | ICD-10-CM | POA: Insufficient documentation

## 2015-05-04 DIAGNOSIS — T8484XA Pain due to internal orthopedic prosthetic devices, implants and grafts, initial encounter: Secondary | ICD-10-CM | POA: Insufficient documentation

## 2015-05-04 DIAGNOSIS — S82852S Displaced trimalleolar fracture of left lower leg, sequela: Secondary | ICD-10-CM | POA: Insufficient documentation

## 2015-05-04 DIAGNOSIS — J45909 Unspecified asthma, uncomplicated: Secondary | ICD-10-CM | POA: Insufficient documentation

## 2015-05-04 DIAGNOSIS — I10 Essential (primary) hypertension: Secondary | ICD-10-CM | POA: Diagnosis not present

## 2015-05-04 DIAGNOSIS — M8580 Other specified disorders of bone density and structure, unspecified site: Secondary | ICD-10-CM | POA: Diagnosis not present

## 2015-05-04 DIAGNOSIS — K5090 Crohn's disease, unspecified, without complications: Secondary | ICD-10-CM | POA: Diagnosis not present

## 2015-05-04 DIAGNOSIS — Z87891 Personal history of nicotine dependence: Secondary | ICD-10-CM | POA: Insufficient documentation

## 2015-05-04 DIAGNOSIS — X58XXXS Exposure to other specified factors, sequela: Secondary | ICD-10-CM | POA: Insufficient documentation

## 2015-05-04 DIAGNOSIS — Z880 Allergy status to penicillin: Secondary | ICD-10-CM | POA: Insufficient documentation

## 2015-05-04 DIAGNOSIS — Z8673 Personal history of transient ischemic attack (TIA), and cerebral infarction without residual deficits: Secondary | ICD-10-CM | POA: Diagnosis not present

## 2015-05-04 HISTORY — DX: Other specified disorders of bone density and structure, unspecified site: M85.80

## 2015-05-04 HISTORY — DX: Major depressive disorder, single episode, unspecified: F32.9

## 2015-05-04 HISTORY — DX: Personal history of urinary calculi: Z87.442

## 2015-05-04 HISTORY — DX: Reserved for inherently not codable concepts without codable children: IMO0001

## 2015-05-04 HISTORY — PX: HARDWARE REMOVAL: SHX979

## 2015-05-04 HISTORY — DX: Other cervical disc degeneration, unspecified cervical region: M50.30

## 2015-05-04 HISTORY — DX: Depression, unspecified: F32.A

## 2015-05-04 HISTORY — DX: Cerebral infarction, unspecified: I63.9

## 2015-05-04 LAB — BASIC METABOLIC PANEL
Anion gap: 7 (ref 5–15)
BUN: 14 mg/dL (ref 6–20)
CALCIUM: 9.1 mg/dL (ref 8.9–10.3)
CO2: 26 mmol/L (ref 22–32)
CREATININE: 0.84 mg/dL (ref 0.44–1.00)
Chloride: 105 mmol/L (ref 101–111)
GFR calc Af Amer: >60 mL/min (ref >60)
GFR calc non Af Amer: >60 mL/min (ref >60)
GLUCOSE: 97 mg/dL (ref 65–99)
Potassium: 4.6 mmol/L (ref 3.5–5.1)
Sodium: 138 mmol/L (ref 135–145)

## 2015-05-04 LAB — CBC
HCT: 41.7 % (ref 36.0–46.0)
Hemoglobin: 13.8 g/dL (ref 12.0–15.0)
MCH: 31.2 pg (ref 26.0–34.0)
MCHC: 33.1 g/dL (ref 30.0–36.0)
MCV: 94.1 fL (ref 78.0–100.0)
PLATELETS: 188 10*3/uL (ref 150–400)
RBC: 4.43 MIL/uL (ref 3.87–5.11)
RDW: 12.8 % (ref 11.5–15.5)
WBC: 4.9 10*3/uL (ref 4.0–10.5)

## 2015-05-04 LAB — SURGICAL PCR SCREEN
MRSA, PCR: NEGATIVE
Staphylococcus aureus: NEGATIVE

## 2015-05-04 SURGERY — REMOVAL, HARDWARE
Anesthesia: General | Site: Ankle | Laterality: Left

## 2015-05-04 MED ORDER — DEXAMETHASONE SODIUM PHOSPHATE 4 MG/ML IJ SOLN
INTRAMUSCULAR | Status: DC | PRN
  Administered 2015-05-04: 4 mg via INTRAVENOUS

## 2015-05-04 MED ORDER — MUPIROCIN 2 % EX OINT
TOPICAL_OINTMENT | Freq: Once | CUTANEOUS | Status: AC
  Administered 2015-05-04: 1 via NASAL
  Filled 2015-05-04 (×2): qty 22

## 2015-05-04 MED ORDER — ONDANSETRON HCL 4 MG PO TABS: 4.0000 mg | ORAL_TABLET | Freq: Three times a day (TID) | ORAL | Status: DC | PRN

## 2015-05-04 MED ORDER — DEXAMETHASONE SODIUM PHOSPHATE 4 MG/ML IJ SOLN
INTRAMUSCULAR | Status: AC
  Filled 2015-05-04: qty 1

## 2015-05-04 MED ORDER — BUPIVACAINE-EPINEPHRINE (PF) 0.5% -1:200000 IJ SOLN
INTRAMUSCULAR | Status: DC | PRN
  Administered 2015-05-04: 4 mL
  Administered 2015-05-04: 5 mL

## 2015-05-04 MED ORDER — STERILE WATER FOR INJECTION IJ SOLN
INTRAMUSCULAR | Status: AC
  Filled 2015-05-04: qty 10

## 2015-05-04 MED ORDER — PROPOFOL 10 MG/ML IV BOLUS
INTRAVENOUS | Status: AC
  Filled 2015-05-04: qty 20

## 2015-05-04 MED ORDER — 0.9 % SODIUM CHLORIDE (POUR BTL) OPTIME
TOPICAL | Status: DC | PRN
  Administered 2015-05-04: 1000 mL

## 2015-05-04 MED ORDER — FENTANYL CITRATE (PF) 100 MCG/2ML IJ SOLN
INTRAMUSCULAR | Status: DC | PRN
  Administered 2015-05-04: 25 ug via INTRAVENOUS
  Administered 2015-05-04: 50 ug via INTRAVENOUS

## 2015-05-04 MED ORDER — METHYLPREDNISOLONE ACETATE 40 MG/ML IJ SUSP
INTRAMUSCULAR | Status: AC
  Filled 2015-05-04: qty 2

## 2015-05-04 MED ORDER — ONDANSETRON HCL 4 MG/2ML IJ SOLN
INTRAMUSCULAR | Status: DC | PRN
  Administered 2015-05-04: 4 mg via INTRAVENOUS

## 2015-05-04 MED ORDER — EPHEDRINE SULFATE 50 MG/ML IJ SOLN
INTRAMUSCULAR | Status: AC
  Filled 2015-05-04: qty 1

## 2015-05-04 MED ORDER — HYDROMORPHONE HCL 1 MG/ML IJ SOLN
INTRAMUSCULAR | Status: AC
  Filled 2015-05-04: qty 1

## 2015-05-04 MED ORDER — ULTRAM 50 MG PO TABS: 50.0000 mg | ORAL_TABLET | Freq: Two times a day (BID) | ORAL | Status: DC | PRN

## 2015-05-04 MED ORDER — SUCCINYLCHOLINE CHLORIDE 20 MG/ML IJ SOLN
INTRAMUSCULAR | Status: AC
  Filled 2015-05-04: qty 1

## 2015-05-04 MED ORDER — EPHEDRINE SULFATE 50 MG/ML IJ SOLN
INTRAMUSCULAR | Status: DC | PRN
  Administered 2015-05-04 (×2): 5 mg via INTRAVENOUS

## 2015-05-04 MED ORDER — FENTANYL CITRATE (PF) 250 MCG/5ML IJ SOLN
INTRAMUSCULAR | Status: AC
  Filled 2015-05-04: qty 5

## 2015-05-04 MED ORDER — CLINDAMYCIN PHOSPHATE 900 MG/50ML IV SOLN
INTRAVENOUS | Status: AC
  Administered 2015-05-04: 900 mg via INTRAVENOUS
  Filled 2015-05-04: qty 50

## 2015-05-04 MED ORDER — ROCURONIUM BROMIDE 50 MG/5ML IV SOLN
INTRAVENOUS | Status: AC
  Filled 2015-05-04: qty 1

## 2015-05-04 MED ORDER — PROMETHAZINE HCL 25 MG/ML IJ SOLN: 6.2500 mg | INTRAMUSCULAR | Status: DC | PRN

## 2015-05-04 MED ORDER — METHYLPREDNISOLONE ACETATE 40 MG/ML IJ SUSP
INTRAMUSCULAR | Status: DC | PRN
  Administered 2015-05-04: 60 mg via INTRA_ARTICULAR

## 2015-05-04 MED ORDER — LIDOCAINE HCL (CARDIAC) 20 MG/ML IV SOLN
INTRAVENOUS | Status: DC | PRN
  Administered 2015-05-04: 80 mg via INTRAVENOUS

## 2015-05-04 MED ORDER — LACTATED RINGERS IV SOLN
INTRAVENOUS | Status: DC | PRN
  Administered 2015-05-04: 07:00:00 via INTRAVENOUS

## 2015-05-04 MED ORDER — LIDOCAINE HCL (CARDIAC) 20 MG/ML IV SOLN
INTRAVENOUS | Status: AC
  Filled 2015-05-04: qty 10

## 2015-05-04 MED ORDER — HYDROMORPHONE HCL 1 MG/ML IJ SOLN
0.2500 mg | INTRAMUSCULAR | Status: DC | PRN
  Administered 2015-05-04 (×3): 0.5 mg via INTRAVENOUS

## 2015-05-04 MED ORDER — MIDAZOLAM HCL 5 MG/5ML IJ SOLN
INTRAMUSCULAR | Status: DC | PRN
  Administered 2015-05-04: 2 mg via INTRAVENOUS

## 2015-05-04 MED ORDER — MIDAZOLAM HCL 2 MG/2ML IJ SOLN
INTRAMUSCULAR | Status: AC
  Filled 2015-05-04: qty 4

## 2015-05-04 MED ORDER — PHENYLEPHRINE HCL 10 MG/ML IJ SOLN
INTRAMUSCULAR | Status: DC | PRN
  Administered 2015-05-04: 40 ug via INTRAVENOUS

## 2015-05-04 MED ORDER — PROPOFOL 10 MG/ML IV BOLUS
INTRAVENOUS | Status: DC | PRN
  Administered 2015-05-04: 100 mg via INTRAVENOUS

## 2015-05-04 MED ORDER — BUPIVACAINE-EPINEPHRINE (PF) 0.5% -1:200000 IJ SOLN
INTRAMUSCULAR | Status: AC
  Filled 2015-05-04: qty 30

## 2015-05-04 MED ORDER — PHENYLEPHRINE 40 MCG/ML (10ML) SYRINGE FOR IV PUSH (FOR BLOOD PRESSURE SUPPORT)
PREFILLED_SYRINGE | INTRAVENOUS | Status: AC
  Filled 2015-05-04: qty 10

## 2015-05-04 MED ORDER — HYDROCODONE-ACETAMINOPHEN 5-325 MG PO TABS: 1.0000 | ORAL_TABLET | Freq: Four times a day (QID) | ORAL | Status: DC | PRN

## 2015-05-04 SURGICAL SUPPLY — 59 items
BANDAGE ELASTIC 4 VELCRO ST LF (GAUZE/BANDAGES/DRESSINGS) ×2 IMPLANT
BANDAGE ELASTIC 6 VELCRO ST LF (GAUZE/BANDAGES/DRESSINGS) ×2 IMPLANT
BANDAGE ESMARK 6X9 LF (GAUZE/BANDAGES/DRESSINGS) ×1 IMPLANT
BNDG COHESIVE 6X5 TAN STRL LF (GAUZE/BANDAGES/DRESSINGS) ×2 IMPLANT
BNDG ESMARK 6X9 LF (GAUZE/BANDAGES/DRESSINGS) ×2
BNDG GAUZE ELAST 4 BULKY (GAUZE/BANDAGES/DRESSINGS) ×2 IMPLANT
BRUSH SCRUB DISP (MISCELLANEOUS) ×4 IMPLANT
CLEANER TIP ELECTROSURG 2X2 (MISCELLANEOUS) ×2 IMPLANT
COVER SURGICAL LIGHT HANDLE (MISCELLANEOUS) ×4 IMPLANT
CUFF TOURNIQUET SINGLE 18IN (TOURNIQUET CUFF) IMPLANT
CUFF TOURNIQUET SINGLE 24IN (TOURNIQUET CUFF) IMPLANT
CUFF TOURNIQUET SINGLE 34IN LL (TOURNIQUET CUFF) IMPLANT
DRAPE C-ARM 42X72 X-RAY (DRAPES) IMPLANT
DRAPE C-ARMOR (DRAPES) ×2 IMPLANT
DRAPE OEC MINIVIEW 54X84 (DRAPES) ×2 IMPLANT
DRAPE U-SHAPE 47X51 STRL (DRAPES) ×2 IMPLANT
DRSG ADAPTIC 3X8 NADH LF (GAUZE/BANDAGES/DRESSINGS) ×2 IMPLANT
ELECT REM PT RETURN 9FT ADLT (ELECTROSURGICAL) ×2
ELECTRODE REM PT RTRN 9FT ADLT (ELECTROSURGICAL) ×1 IMPLANT
EVACUATOR 1/8 PVC DRAIN (DRAIN) IMPLANT
GAUZE SPONGE 4X4 12PLY STRL (GAUZE/BANDAGES/DRESSINGS) ×2 IMPLANT
GLOVE BIO SURGEON STRL SZ7.5 (GLOVE) ×2 IMPLANT
GLOVE BIO SURGEON STRL SZ8 (GLOVE) ×2 IMPLANT
GLOVE BIOGEL PI IND STRL 7.5 (GLOVE) ×1 IMPLANT
GLOVE BIOGEL PI IND STRL 8 (GLOVE) ×1 IMPLANT
GLOVE BIOGEL PI INDICATOR 7.5 (GLOVE) ×1
GLOVE BIOGEL PI INDICATOR 8 (GLOVE) ×1
GOWN STRL REUS W/ TWL LRG LVL3 (GOWN DISPOSABLE) ×2 IMPLANT
GOWN STRL REUS W/ TWL XL LVL3 (GOWN DISPOSABLE) ×1 IMPLANT
GOWN STRL REUS W/TWL LRG LVL3 (GOWN DISPOSABLE) ×2
GOWN STRL REUS W/TWL XL LVL3 (GOWN DISPOSABLE) ×1
KIT BASIN OR (CUSTOM PROCEDURE TRAY) ×2 IMPLANT
KIT ROOM TURNOVER OR (KITS) ×2 IMPLANT
MANIFOLD NEPTUNE II (INSTRUMENTS) ×2 IMPLANT
NEEDLE 22X1 1/2 (OR ONLY) (NEEDLE) IMPLANT
NS IRRIG 1000ML POUR BTL (IV SOLUTION) ×2 IMPLANT
PACK ORTHO EXTREMITY (CUSTOM PROCEDURE TRAY) ×2 IMPLANT
PAD ARMBOARD 7.5X6 YLW CONV (MISCELLANEOUS) ×4 IMPLANT
PADDING CAST COTTON 6X4 STRL (CAST SUPPLIES) ×6 IMPLANT
SPONGE GAUZE 4X4 12PLY STER LF (GAUZE/BANDAGES/DRESSINGS) ×2 IMPLANT
SPONGE LAP 18X18 X RAY DECT (DISPOSABLE) ×2 IMPLANT
SPONGE SCRUB IODOPHOR (GAUZE/BANDAGES/DRESSINGS) ×2 IMPLANT
STAPLER VISISTAT 35W (STAPLE) IMPLANT
STOCKINETTE IMPERVIOUS LG (DRAPES) ×2 IMPLANT
STRIP CLOSURE SKIN 1/2X4 (GAUZE/BANDAGES/DRESSINGS) IMPLANT
SUCTION FRAZIER TIP 10 FR DISP (SUCTIONS) IMPLANT
SUT ETHILON 3 0 PS 1 (SUTURE) IMPLANT
SUT PDS AB 2-0 CT1 27 (SUTURE) IMPLANT
SUT VIC AB 0 CT1 27 (SUTURE)
SUT VIC AB 0 CT1 27XBRD ANBCTR (SUTURE) IMPLANT
SUT VIC AB 2-0 CT1 27 (SUTURE)
SUT VIC AB 2-0 CT1 TAPERPNT 27 (SUTURE) IMPLANT
SYR CONTROL 10ML LL (SYRINGE) IMPLANT
TOWEL OR 17X24 6PK STRL BLUE (TOWEL DISPOSABLE) ×4 IMPLANT
TOWEL OR 17X26 10 PK STRL BLUE (TOWEL DISPOSABLE) ×4 IMPLANT
TUBE CONNECTING 12X1/4 (SUCTIONS) ×2 IMPLANT
UNDERPAD 30X30 INCONTINENT (UNDERPADS AND DIAPERS) ×2 IMPLANT
WATER STERILE IRR 1000ML POUR (IV SOLUTION) ×4 IMPLANT
YANKAUER SUCT BULB TIP NO VENT (SUCTIONS) ×2 IMPLANT

## 2015-05-04 NOTE — Transfer of Care (Signed)
Immediate Anesthesia Transfer of Care Note  Patient: Julia [name]  Procedure(s) Performed: Procedure(s): REMOVAL OF HARDWARE LEFT ANKLE  (Left)  Patient Location: PACU  Anesthesia Type:General  Level of Consciousness: awake, alert  and oriented  Airway & Oxygen Therapy: Patient Spontanous Breathing and Patient connected to nasal cannula oxygen  Post-op Assessment: Report given to RN and Post -op Vital signs reviewed and stable  Post vital signs: Reviewed and stable  Last Vitals:  Filed Vitals:   05/04/15 0922  BP: 136/90  Pulse: 69  Temp:   Resp: 9    Complications: No apparent anesthesia complications

## 2015-05-04 NOTE — Brief Op Note (Signed)
05/04/2015  9:07 AM  PATIENT:  Julia [name]  64 y.o. female  PRE-OPERATIVE DIAGNOSIS:   1. SYMPTOMATIC HARDWARE LEFT ANKLE, TIBIA AND FIBULA  2. LEFT ANKLE ARTHRITIS, POST TRAUMATIC  POST-OPERATIVE DIAGNOSIS:   1. SYMPTOMATIC HARDWARE LEFT ANKLE, TIBIA AND FIBULA  2. LEFT ANKLE ARTHRITIS, POST TRAUMATIC  PROCEDURE:  Procedure(s): 1. REMOVAL OF HARDWARE LEFT ANKLE  (Left), TIBIA AND FIBULA 2. LEFT ANKLE INJECTION, DEPOMEDROL AND MARCAINE  SURGEON:  Surgeon(s) and Role:    * Altamese [place], MD - Primary  PHYSICIAN ASSISTANT: Ainsley Spinner, PA-C  ANESTHESIA:   general  I/O:  Total I/O In: 800 [I.V.:800] Out: 20 [Blood:20]  SPECIMEN:  No Specimen  TOURNIQUET:  * No tourniquets in log *  DICTATION: .Other Dictation: Dictation Number [id number]

## 2015-05-04 NOTE — Anesthesia Preprocedure Evaluation (Addendum)
Anesthesia Evaluation  Patient identified by MRN, date of birth, ID band Patient awake    Reviewed: Allergy & Precautions, NPO status , Patient's Chart, lab work & pertinent test results  Airway Mallampati: II  TM Distance: >3 FB Neck ROM: Full    Dental  (+) Teeth Intact, Dental Advisory Given   Pulmonary shortness of breath and with exertion, asthma , former smoker,  breath sounds clear to auscultation        Cardiovascular hypertension, Pt. on medications Rhythm:Regular Rate:Normal     Neuro/Psych  Headaches, Depression CVA    GI/Hepatic GERD-  Controlled,  Endo/Other    Renal/GU      Musculoskeletal   Abdominal   Peds  Hematology   Anesthesia Other Findings   Reproductive/Obstetrics                            Anesthesia Physical Anesthesia Plan  ASA: III  Anesthesia Plan: General   Post-op Pain Management:    Induction: Intravenous  Airway Management Planned: LMA and Oral ETT  Additional Equipment:   Intra-op Plan:   Post-operative Plan: Extubation in OR  Informed Consent: I have reviewed the patients History and Physical, chart, labs and discussed the procedure including the risks, benefits and alternatives for the proposed anesthesia with the patient or authorized representative who has indicated his/her understanding and acceptance.   Dental advisory given  Plan Discussed with: CRNA and Anesthesiologist  Anesthesia Plan Comments:         Anesthesia Quick Evaluation

## 2015-05-04 NOTE — Anesthesia Procedure Notes (Signed)
Procedure Name: LMA Insertion Date/Time: 05/04/2015 8:09 AM Performed by: Ollen Bowl Pre-anesthesia Checklist: Patient identified, Emergency Drugs available, Suction available, Patient being monitored and Timeout performed Patient Re-evaluated:Patient Re-evaluated prior to inductionOxygen Delivery Method: Circle system utilized and Simple face mask Preoxygenation: Pre-oxygenation with 100% oxygen Intubation Type: IV induction Ventilation: Mask ventilation without difficulty LMA: LMA inserted LMA Size: 4.0 Airway Equipment and Method: Patient positioned with wedge pillow Placement Confirmation: positive ETCO2 and breath sounds checked- equal and bilateral Tube secured with: Tape Dental Injury: Teeth and Oropharynx as per pre-operative assessment

## 2015-05-04 NOTE — Discharge Instructions (Signed)
[facility] Discharge Instructions   General Discharge Instructions  WEIGHT BEARING STATUS: Weightbearing as tolerated  RANGE OF MOTION/ACTIVITY: as tolerated   PAIN MEDICATION USE AND EXPECTATIONS  You have likely been given narcotic medications to help control your pain.  After a traumatic event that results in an fracture (broken bone) with or without surgery, it is ok to use narcotic pain medications to help control one's pain.  We understand that everyone responds to pain differently and each individual patient will be evaluated on a regular basis for the continued need for narcotic medications. Ideally, narcotic medication use should last no more than 6-8 weeks (coinciding with fracture healing).   As a patient it is your responsibility as well to monitor narcotic medication use and report the amount and frequency you use these medications when you come to your office visit.   We would also advise that if you are using narcotic medications, you should take a dose prior to therapy to maximize you participation.  IF YOU ARE ON NARCOTIC MEDICATIONS IT IS NOT PERMISSIBLE TO OPERATE A MOTOR VEHICLE (MOTORCYCLE/CAR/TRUCK/MOPED) OR HEAVY MACHINERY DO NOT MIX NARCOTICS WITH OTHER CNS (CENTRAL NERVOUS SYSTEM) DEPRESSANTS SUCH AS ALCOHOL  Diet: as you were eating previously.  Can use over the counter stool softeners and bowel preparations, such as Miralax, to help with bowel movements.  Narcotics can be constipating.  Be sure to drink plenty of fluids  Wound Care: daily dressing changes starting on 05/07/2015, see below   STOP SMOKING OR USING NICOTINE PRODUCTS!!!!  As discussed nicotine severely impairs your body's ability to heal surgical and traumatic wounds but also impairs bone healing.  Wounds and bone heal by forming microscopic blood vessels (angiogenesis) and nicotine is a vasoconstrictor (essentially, shrinks blood vessels).  Therefore, if vasoconstriction occurs to these  microscopic blood vessels they essentially disappear and are unable to deliver necessary nutrients to the healing tissue.  This is one modifiable factor that you can do to dramatically increase your chances of healing your injury.    (This means no smoking, no nicotine gum, patches, etc)  DO NOT USE NONSTEROIDAL ANTI-INFLAMMATORY DRUGS (NSAID'S)  Using products such as Advil (ibuprofen), Aleve (naproxen), Motrin (ibuprofen) for additional pain control during fracture healing can delay and/or prevent the healing response.  If you would like to take over the counter (OTC) medication, Tylenol (acetaminophen) is ok.  However, some narcotic medications that are given for pain control contain acetaminophen as well. Therefore, you should not exceed more than 4000 mg of tylenol in a day if you do not have liver disease.  Also note that there are may OTC medicines, such as cold medicines and allergy medicines that my contain tylenol as well.  If you have any questions about medications and/or interactions please ask your doctor/PA or your pharmacist.      ICE AND ELEVATE INJURED/OPERATIVE EXTREMITY  Using ice and elevating the injured extremity above your heart can help with swelling and pain control.  Icing in a pulsatile fashion, such as 20 minutes on and 20 minutes off, can be followed.    Do not place ice directly on skin. Make sure there is a barrier between to skin and the ice pack.    Using frozen items such as frozen peas works well as the conform nicely to the are that needs to be iced.  USE AN ACE WRAP OR TED HOSE FOR SWELLING CONTROL  In addition to icing and elevation, Ace wraps or TED hose  are used to help limit and resolve swelling.  It is recommended to use Ace wraps or TED hose until you are informed to stop.    When using Ace Wraps start the wrapping distally (farthest away from the body) and wrap proximally (closer to the body)   Example: If you had surgery on your leg or thing and you do not  have a splint on, start the ace wrap at the toes and work your way up to the thigh        If you had surgery on your upper extremity and do not have a splint on, start the ace wrap at your fingers and work your way up to the upper arm  IF YOU ARE IN A SPLINT OR CAST DO NOT [place]   If your splint gets wet for any reason please contact the office immediately. You may shower in your splint or cast as long as you keep it dry.  This can be done by wrapping in a cast cover or garbage back (or similar)  Do Not stick any thing down your splint or cast such as pencils, money, or hangers to try and scratch yourself with.  If you feel itchy take benadryl as prescribed on the bottle for itching  IF YOU ARE IN A CAM BOOT (BLACK BOOT)  You may remove boot periodically. Perform daily dressing changes as noted below.  Wash the liner of the boot regularly and wear a sock when wearing the boot. It is recommended that you sleep in the boot until told otherwise  CALL THE OFFICE WITH ANY QUESTIONS OR CONCERTS: [phone]     Discharge Pin Site Instructions  Dress pins daily with Kerlix roll starting on POD 2. Wrap the Kerlix so that it tamps the skin down around the pin-skin interface to prevent/limit motion of the skin relative to the pin.  (Pin-skin motion is the primary cause of pain and infection related to external fixator pin sites).  Remove any crust or coagulum that may obstruct drainage with a saline moistened gauze or soap and water.  After POD 3, if there is no discernable drainage on the pin site dressing, the interval for change can by increased to every other day.  You may shower with the fixator, cleaning all pin sites gently with soap and water.  If you have a surgical wound this needs to be completely dry and without drainage before showering.  The extremity can be lifted by the fixator to facilitate wound care and transfers.  Notify the office/Doctor if you experience  increasing drainage, redness, or pain from a pin site, or if you notice purulent (thick, snot-like) drainage.  Discharge Wound Care Instructions  Do NOT apply any ointments, solutions or lotions to pin sites or surgical wounds.  These prevent needed drainage and even though solutions like hydrogen peroxide kill bacteria, they also damage cells lining the pin sites that help fight infection.  Applying lotions or ointments can keep the wounds moist and can cause them to breakdown and open up as well. This can increase the risk for infection. When in doubt call the office.  Surgical incisions should be dressed daily.  If any drainage is noted, use one layer of adaptic, then gauze, Kerlix, and an ace wrap.  Once the incision is completely dry and without drainage, it may be left open to air out.  Showering may begin 36-48 hours later.  Cleaning gently with soap and water.  Traumatic  wounds should be dressed daily as well.    One layer of adaptic, gauze, Kerlix, then ace wrap.  The adaptic can be discontinued once the draining has ceased    If you have a wet to dry dressing: wet the gauze with saline the squeeze as much saline out so the gauze is moist (not soaking wet), place moistened gauze over wound, then place a dry gauze over the moist one, followed by Kerlix wrap, then ace wrap.

## 2015-05-04 NOTE — Progress Notes (Signed)
Giving lunch relief

## 2015-05-04 NOTE — Op Note (Signed)
NAMEEMILEE, MARKET             ACCOUNT NO.:  [id number]  MEDICAL RECORD NO.:  [id number]  LOCATION:  [facility]                         FACILITY:  [facility]  PHYSICIAN:  Astrid Divine. Marcelino Scot, M.D. DATE OF BIRTH:  11-18-1950  DATE OF PROCEDURE:  05/04/2015 DATE OF DISCHARGE:  05/04/2015                              OPERATIVE REPORT   PREOPERATIVE DIAGNOSES: 1. Symptomatic hardware, left ankle, tibia, and fibula. 2. Left ankle arthritis, posttraumatic.  POSTOPERATIVE DIAGNOSES: 1. Symptomatic hardware, left ankle, tibia, and fibula. 2. Left ankle arthritis, posttraumatic.  PROCEDURES: 1. Removal of hardware, left ankle, tibia, and fibula. 2. Left ankle injection, Depo-Medrol and Marcaine.  SURGEON:  Altamese [place], M.D.  ASSISTANT:  Ainsley Spinner, PA-C.  ANESTHESIA:  General.  TOURNIQUET:  None.  DISPOSITION:  To PACU.  CONDITION:  Stable.  BRIEF INDICATION OF PROCEDURE:  Kenyette Gundy is a very pleasant 64- year-old female status post a left ankle trimalleolar fracture dislocation, June 26, 2014.  She underwent ORIF with healing confirmed by a CT but also with demonstration of some mild posttraumatic arthritis.  She continued to have tenderness at the sites of the implants both medially and laterally.  We attempted management with nonsurgical means but these complaints remain persistent; and consequently, she requested hardware removal.  I did discuss with her the risks and benefits of surgery including the possibility of failure to alleviate her symptoms, progression of arthritis, nerve injury, vessel injury, and others.  After full discussion of these risks and others, she did wish to proceed.  BRIEF SUMMARY OF PROCEDURE:  Ms. Etherington was given clindamycin preoperatively, taken to the operating room where general anesthesia was induced.  Her left lower extremity was prepped and draped in usual sterile fashion.  No tourniquet was used during the procedure.  The old medial  incision was made.  Dissection was carried carefully down to the head of the more anterior screws which were identified and removed with the help of retraction from my assistant.  This continued to the more posterior of the medial screws.  We then turned laterally where the old incision was remade over the fibula, and the plate screws were quite prominent.  The plate was exposed in its entirety, and then, all of the screws were removed.  This was confirmed radiographically.  A 60 mg of Depo-Medrol mixed with 5 mL of Marcaine were then injected into the left ankle joint delivering about 4 mL in total.  Both wounds were irrigated thoroughly and closed in standard layered fashion using 2-0 Vicryl, 3-0 nylon.  Sterile gently compressive dressing was applied.  The patient was awakened from anesthesia and transported to the PACU in stable condition.  Ainsley Spinner, PA-C did assist me throughout, including with retraction, exposure, hardware removal, and simultaneous wound closure.  PROGNOSIS:  Ms. Mcwherter will be weightbearing as tolerated with no formal restrictions.  I will plan to see her back in the office for removing the sutures in 14 days.  She will not require any formal DVT prophylaxis, and we are hopeful that her arthritis will not progress, and she will be able to resume more high-resolution low-impact strengthening program of her ankle joint to minimize these symptoms.  Astrid Divine. Marcelino Scot, M.D.     MHH/MEDQ  D:  05/04/2015  T:  05/04/2015  Job:  [id number]

## 2015-05-04 NOTE — Anesthesia Postprocedure Evaluation (Signed)
  Anesthesia Post-op Note  Patient: Julia [name]  Procedure(s) Performed: Procedure(s): REMOVAL OF HARDWARE LEFT ANKLE  (Left)  Patient Location: PACU  Anesthesia Type:General  Level of Consciousness: awake and alert   Airway and Oxygen Therapy: Patient Spontanous Breathing  Post-op Pain: mild  Post-op Assessment: Post-op Vital signs reviewed LLE Motor Response: Purposeful movement, Responds to commands (wiggles toes) LLE Sensation: No numbness          Post-op Vital Signs: Reviewed  Last Vitals:  Filed Vitals:   05/04/15 0951  BP: 136/84  Pulse: 64  Temp:   Resp: 16    Complications: No apparent anesthesia complications

## 2015-05-05 ENCOUNTER — Encounter (HOSPITAL_COMMUNITY): Payer: Self-pay | Admitting: Orthopedic Surgery

## 2015-05-20 ENCOUNTER — Telehealth: Payer: Self-pay | Admitting: Family Medicine

## 2015-05-20 NOTE — Telephone Encounter (Signed)
[employer] is denying coverage for pt's triazolam 0.37m. She needs try and fail Silenor before they will cover it.

## 2015-05-20 NOTE — Telephone Encounter (Signed)
See below

## 2015-05-20 NOTE — Telephone Encounter (Signed)
Per up to date  "Cardiovascular disease: Use with caution in patients with a history of cardiovascular disease (including previous MI, stroke, tachycardia, or conduction abnormalities); the risk conduction abnormalities with this agent is moderate relative to other antidepressants"  She has had a stroke, so I would not recommend she trial silenor/doxepin. Would prefer they fill the triazolam given this relative contraindication  Appreciate your help,  Annie Main

## 2015-05-24 ENCOUNTER — Encounter: Payer: Self-pay | Admitting: Family Medicine

## 2015-05-24 ENCOUNTER — Telehealth: Payer: Self-pay | Admitting: Family Medicine

## 2015-05-24 NOTE — Telephone Encounter (Signed)
FYI

## 2015-05-24 NOTE — Telephone Encounter (Signed)
I faxed a letter to [facility] on her behalf and asked for an expidited reconsideration/appeal.

## 2015-05-24 NOTE — Telephone Encounter (Signed)
[employer] calling to confirm appeal of triazolam (HALCION) 0.25 MG tablet 72 hrs turnaround, any notes or helpful info pls fax to [phone]

## 2015-06-02 ENCOUNTER — Telehealth: Payer: Self-pay | Admitting: Family Medicine

## 2015-06-04 ENCOUNTER — Telehealth: Payer: Self-pay | Admitting: Family Medicine

## 2015-06-04 DIAGNOSIS — I10 Essential (primary) hypertension: Secondary | ICD-10-CM

## 2015-06-04 MED ORDER — PROPRANOLOL HCL 60 MG PO TABS: 60.0000 mg | ORAL_TABLET | Freq: Two times a day (BID) | ORAL | Status: DC

## 2015-06-04 NOTE — Telephone Encounter (Signed)
See below

## 2015-06-04 NOTE — Telephone Encounter (Signed)
Approved.  

## 2015-06-04 NOTE — Telephone Encounter (Signed)
Per [employer] PA for Propranolol LA is denied. Pt must try and fail two meds on the formulary i.e. Atenolol and Propranolol IR.

## 2015-06-04 NOTE — Telephone Encounter (Signed)
I changed to immediate release propranolol. Still 3m still twice a day- please make patient aware

## 2015-06-04 NOTE — Telephone Encounter (Signed)
Keba, not sure what I need to do with this. You can fill it. Thanks

## 2015-06-05 NOTE — Telephone Encounter (Signed)
Lm on pt vm tcb to make aware of below.

## 2015-06-14 ENCOUNTER — Other Ambulatory Visit: Payer: Self-pay | Admitting: Family Medicine

## 2015-06-14 NOTE — Telephone Encounter (Signed)
Yes thanks 

## 2015-06-14 NOTE — Telephone Encounter (Signed)
Refill ok? 

## 2015-06-15 ENCOUNTER — Encounter: Payer: Self-pay | Admitting: Family Medicine

## 2015-06-21 ENCOUNTER — Other Ambulatory Visit: Payer: Self-pay | Admitting: Family Medicine

## 2015-06-21 NOTE — Telephone Encounter (Signed)
Yes thanks 

## 2015-06-21 NOTE — Telephone Encounter (Signed)
Refill ok? 

## 2015-06-28 ENCOUNTER — Ambulatory Visit: Payer: Medicare Other | Admitting: Family Medicine

## 2015-07-14 ENCOUNTER — Telehealth: Payer: Self-pay | Admitting: Family Medicine

## 2015-07-14 NOTE — Telephone Encounter (Signed)
Pt insurance does not cover the following med propranolol (INDERAL) 60 MG tablet    [facility]

## 2015-07-15 NOTE — Telephone Encounter (Signed)
Pt states insurance will cover a lower dose of Propanol.

## 2015-07-15 NOTE — Telephone Encounter (Signed)
Called pt back and she states she will wait and discuss this info when she comes in next Tuesday.

## 2015-07-15 NOTE — Telephone Encounter (Signed)
I assume they are talking about the extended release version. What dose is covered? I need this information to be able to prescribe

## 2015-07-20 ENCOUNTER — Ambulatory Visit (INDEPENDENT_AMBULATORY_CARE_PROVIDER_SITE_OTHER): Payer: Medicare Other | Admitting: Family Medicine

## 2015-07-20 ENCOUNTER — Encounter: Payer: Self-pay | Admitting: Family Medicine

## 2015-07-20 VITALS — BP 124/80 | HR 71 | Temp 99.2°F | Wt 168.0 lb

## 2015-07-20 DIAGNOSIS — Z23 Encounter for immunization: Secondary | ICD-10-CM | POA: Diagnosis not present

## 2015-07-20 DIAGNOSIS — I10 Essential (primary) hypertension: Secondary | ICD-10-CM | POA: Diagnosis not present

## 2015-07-20 DIAGNOSIS — G4730 Sleep apnea, unspecified: Secondary | ICD-10-CM | POA: Diagnosis not present

## 2015-07-20 DIAGNOSIS — Z20828 Contact with and (suspected) exposure to other viral communicable diseases: Secondary | ICD-10-CM | POA: Diagnosis not present

## 2015-07-20 DIAGNOSIS — J45991 Cough variant asthma: Secondary | ICD-10-CM

## 2015-07-20 MED ORDER — ATENOLOL 50 MG PO TABS: 50.0000 mg | ORAL_TABLET | Freq: Every day | ORAL | Status: DC

## 2015-07-20 NOTE — Patient Instructions (Addendum)
Flu shot received today.  Stop propranolol after dose tonight given variability in blood pressure on this. Start atenolol 69m daily tomorrow morning. Let's trial this at least 2 weeks to see if we get blood pressure stability. Also let me know immediately if you have intolerable side effects  Continue qvar twice a day. Mucinex DM is fine. Albuterol as needed for cough, shortness of breath, wheeze  We will call you within a week about your referral to sleep medicine. If you do not hear within 2 weeks, give [place] call. We requested [place] office- if they give you another office than this- give them the name specifically of who you want to use and they can resubmit  6 months planned unless something comes up sooner or if blood pressure issues with changes  Consider updating bloodwork next visit

## 2015-07-20 NOTE — Assessment & Plan Note (Signed)
S: controlled. In office on Amlodipine 2.74m, propranolol IR 60 BID. Also has clonidine 0.1 mg if SBP > 170  But has not had to use.  Patient states since the change to IR from ER propranolol she has experienced BP fluctuations from as low as 110 up to 160 which she had never experienced on ER BP Readings from Last 3 Encounters:  07/20/15 124/80  05/04/15 156/88  12/22/14 118/72  A/P:Continue amlodipine. Due to variability in BP at home- change to atenolol 527mdaily to see if more consistent. We may ultimately have to return to propranolol ER if fails this next treatment option.

## 2015-07-20 NOTE — Progress Notes (Signed)
Garret Reddish, MD  Subjective:  Julia [name] is a 64 y.o. year old very pleasant female patient who presents for/with See problem oriented charting ROS- No chest pain or shortness of breath. No headache or blurry vision. Dos have cough as noted lower.   Past Medical History-  Patient Active Problem List   Diagnosis Date Noted  . Exposure to viral disease 07/20/2015    Priority: High  . Hemorrhagic stroke ([place]) 10/01/2014    Priority: High  . Crohn's disease ([place]) 04/05/2007    Priority: High  . Osteopenia 10/01/2014    Priority: Medium  . Depression 10/19/2010    Priority: Medium  . Hyperlipidemia 06/29/2010    Priority: Medium  . Cough variant asthma 09/15/2009    Priority: Medium  . CEREBRAL ANEURYSM 04/29/2008    Priority: Medium  . Insomnia 11/28/2007    Priority: Medium  . Migraine 04/05/2007    Priority: Medium  . Essential hypertension 04/05/2007    Priority: Medium  . Sleep apnea 04/05/2007    Priority: Medium  . Allergic rhinitis 10/01/2014    Priority: Low  . Trimalleolar fracture of left ankle 06/26/2014    Priority: Low  . DIZZINESS, CHRONIC 10/26/2009    Priority: Low  . Lumbar back pain with radiculopathy affecting left lower extremity 09/21/2008    Priority: Low  . 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 GERD 04/05/2007    Priority: Low    Medications- reviewed and updated Current Outpatient Prescriptions  Medication Sig Dispense Refill  . amLODipine (NORVASC) 2.5 MG tablet TAKE 1 TABLET BY MOUTH EVERY DAY 30 tablet 5  . aspirin 81 MG tablet Take 81 mg by mouth daily.    . beclomethasone (QVAR) 80 MCG/ACT inhaler Inhale 1 puff into the lungs 2 (two) times daily. (Patient taking differently: Inhale 1 puff into the lungs 2 (two) times daily as needed. ) 1 Inhaler 12  . butalbital-acetaminophen-caffeine (FIORICET, ESGIC) 50-325-40 MG tablet TAKE 1 TABLET AS NEEDED FOR HEADACHE OR MIGRAINE ATTACK 14 tablet 3  .  Calcium Carb-Cholecalciferol (CALCIUM 600 + D PO) Take 1 tablet by mouth 2 (two) times daily.    . citalopram (CELEXA) 20 MG tablet TAKE 1 TABLET BY MOUTH EVERY DAY 30 tablet 5  . fexofenadine (ALLEGRA) 180 MG tablet Take 1 tablet (180 mg total) by mouth daily. (Patient taking differently: Take 180 mg by mouth daily as needed for allergies. ) 30 tablet 11  . folic acid (FOLVITE) 1 MG tablet Take 1 mg by mouth daily.    . mesalamine (PENTASA) 250 MG CR capsule Take 250 mg by mouth 2 (two) times daily.     . Multiple Vitamins-Minerals (MULTIVITAMIN WITH MINERALS) tablet Take 1 tablet by mouth daily.    Marland Kitchen omeprazole (PRILOSEC) 20 MG capsule Take 20 mg by mouth daily.    . propranolol (INDERAL) 60 MG tablet Take 1 tablet (60 mg total) by mouth 2 (two) times daily. 60 tablet 5  . rosuvastatin (CRESTOR) 20 MG tablet Take 1 tablet (20 mg total) by mouth 2 (two) times a week. Takes on Mon and Fri 30 tablet 3  . sulfaSALAzine (AZULFIDINE) 500 MG tablet Take 500 mg by mouth 2 (two) times daily.    . triazolam (HALCION) 0.25 MG tablet TAKE 1 TABLET BY MOUTH AT BEDTIME AS NEEDED 30 tablet 2  . albuterol (PROVENTIL HFA;VENTOLIN HFA) 108 (90 BASE) MCG/ACT inhaler Inhale 2 puffs into the lungs every 6 (six)  hours as needed for wheezing or shortness of breath.    . cloNIDine (CATAPRES) 0.1 MG tablet Take 1 tablet (0.1 mg total) by mouth as needed (for systolic BP >979). (Patient not taking: Reported on 07/20/2015) 60 tablet 0  . HYDROcodone-acetaminophen (NORCO) 5-325 MG per tablet Take 1-2 tablets by mouth every 6 (six) hours as needed for moderate pain. (Patient not taking: Reported on 07/20/2015) 60 tablet 0  . ondansetron (ZOFRAN) 4 MG tablet Take 1-2 tablets (4-8 mg total) by mouth every 8 (eight) hours as needed for nausea or vomiting. (Patient not taking: Reported on 07/20/2015) 20 tablet 0  . ULTRAM 50 MG tablet Take 1-2 tablets (50-100 mg total) by mouth every 12 (twelve) hours as needed (breakthrough pain).  (Patient not taking: Reported on 07/20/2015) 40 tablet 1  Also on propranolol IR 88m BID   Objective: BP 124/80 mmHg  Pulse 71  Temp(Src) 99.2 F (37.3 C)  Wt 168 lb (76.204 kg) Gen: NAD, resting comfortably CV: RRR no murmurs rubs or gallops Lungs: CTAB no crackles, wheeze, rhonchi, occasional cough Abdomen: soft/nontender/nondistended/normal bowel sounds. No rebound or guarding.  Ext: no edema Skin: warm, dry  Assessment/Plan:  Exposure to viral disease S: Husband claims history of being told he had a form of hepatitis in remission which he believes is C. He declines further workup. Apparently patient and children were immunized with 3 shots (likely help B) some # of years ago but unclear. Patient had testing through GI and was told that she had not been immunized from Hep B.  A/P: we will request records. Patient initially declined Hep C testing but when this came to light- seems that patient may need both Hep C and Hep B testing as well. If records do not indicate testing- we will pursue further information with testing   Essential hypertension S: controlled. In office on Amlodipine 2.549m propranolol IR 60 BID. Also has clonidine 0.1 mg if SBP > 170  But has not had to use.  Patient states since the change to IR from ER propranolol she has experienced BP fluctuations from as low as 110 up to 160 which she had never experienced on ER BP Readings from Last 3 Encounters:  07/20/15 124/80  05/04/15 156/88  12/22/14 118/72  A/P:Continue amlodipine. Due to variability in BP at home- change to atenolol 5089maily to see if more consistent. We may ultimately have to return to propranolol ER if fails this next treatment option.    Cough variant asthma S: started qvar in last few days as has had recurrent cough. She is trying mucinex DM which helps some as well. Poor control A/P: Advised patient consistent use of qvar. She can continue mucinex dm as well as use albuterol if needed.  Hopeful within a few weeks calms down as normally does as she reintroduces inhaler.    Return precautions advised.  6 months- update bloodwork  Orders Placed This Encounter  Procedures  . Flu Vaccine QUAD 36+ mos IM  . Ambulatory referral to Sleep Studies    Referral Priority:  Routine    Referral Type:  Consultation    Referral Reason:  Specialty Services Required    Number of Visits Requested:  1    Meds ordered this encounter  . atenolol (TENORMIN) 50 MG tablet    Sig: Take 1 tablet (50 mg total) by mouth daily.    Dispense:  30 tablet    Refill:  5    Send again for sleep  eval- see prior note >50% of 25 minute office visit was spent on counseling (discussion maintenance care for asthma, discussing hepatitis screening)  and coordination of care

## 2015-07-20 NOTE — Assessment & Plan Note (Signed)
S: Husband claims history of being told he had a form of hepatitis in remission which he believes is C. He declines further workup. Apparently patient and children were immunized with 3 shots (likely help B) some # of years ago but unclear. Patient had testing through GI and was told that she had not been immunized from Hep B.  A/P: we will request records. Patient initially declined Hep C testing but when this came to light- seems that patient may need both Hep C and Hep B testing as well. If records do not indicate testing- we will pursue further information with testing

## 2015-07-20 NOTE — Assessment & Plan Note (Signed)
S: started qvar in last few days as has had recurrent cough. She is trying mucinex DM which helps some as well. Poor control A/P: Advised patient consistent use of qvar. She can continue mucinex dm as well as use albuterol if needed. Hopeful within a few weeks calms down as normally does as she reintroduces inhaler.

## 2015-08-03 ENCOUNTER — Other Ambulatory Visit: Payer: Self-pay | Admitting: Family Medicine

## 2015-08-03 NOTE — Telephone Encounter (Signed)
Last refill was called in on 06/14/15 #14 with 3 refills, spoke with pharmacy and pt has already gone through all three refills. Please advise?

## 2015-08-03 NOTE — Telephone Encounter (Signed)
May provide #14 only but patient needs to be seen before she runs out of these. Increased pattern is worrisome and she may need to go to see neurology or [facility] wellness center

## 2015-08-09 NOTE — Telephone Encounter (Signed)
Called pt to let her know that we refilled her fioricet and told her your recommendations, she states that she has already seen some one at headhache wellness and neurologist and that she has told you that and you already know that. She states she has an appointment with you in a few months and she will rediscuss this with you then.

## 2015-08-10 ENCOUNTER — Encounter: Payer: Self-pay | Admitting: Family Medicine

## 2015-08-12 ENCOUNTER — Other Ambulatory Visit: Payer: Self-pay | Admitting: Family Medicine

## 2015-08-23 ENCOUNTER — Encounter: Payer: Self-pay | Admitting: Family Medicine

## 2015-08-23 ENCOUNTER — Other Ambulatory Visit: Payer: Self-pay

## 2015-08-23 MED ORDER — PREDNISONE 20 MG PO TABS: 20.0000 mg | ORAL_TABLET | Freq: Every day | ORAL | Status: DC

## 2015-08-23 MED ORDER — TRIAZOLAM 0.25 MG PO TABS: 0.2500 mg | ORAL_TABLET | Freq: Every evening | ORAL | Status: DC | PRN

## 2015-08-23 NOTE — Telephone Encounter (Signed)
You may transfer the medicine Keba.   In addition, may call in prednisone 27m for 7 days. If she has continued issue- likely worth returning for repeat evaluation.

## 2015-08-30 ENCOUNTER — Encounter: Payer: Self-pay | Admitting: Family Medicine

## 2015-08-31 ENCOUNTER — Other Ambulatory Visit: Payer: Self-pay

## 2015-08-31 MED ORDER — PREDNISONE 20 MG PO TABS: 20.0000 mg | ORAL_TABLET | Freq: Every day | ORAL | Status: DC

## 2015-08-31 NOTE — Telephone Encounter (Signed)
You can repeat 1 more prescription. Please make sure patient understands if she has new or worsening symptoms that she needs to schedule a follow up visit at that point though to get plan together and reevaluate her on exam.

## 2015-09-30 ENCOUNTER — Institutional Professional Consult (permissible substitution): Payer: Medicare Other | Admitting: Pulmonary Disease

## 2015-10-28 ENCOUNTER — Encounter: Payer: Self-pay | Admitting: Family Medicine

## 2015-10-28 DIAGNOSIS — H26491 Other secondary cataract, right eye: Secondary | ICD-10-CM | POA: Diagnosis not present

## 2015-10-29 ENCOUNTER — Other Ambulatory Visit: Payer: Self-pay

## 2015-10-29 MED ORDER — OMEPRAZOLE 20 MG PO CPDR: 20.0000 mg | DELAYED_RELEASE_CAPSULE | Freq: Every day | ORAL | Status: DC

## 2015-11-01 ENCOUNTER — Other Ambulatory Visit: Payer: Self-pay

## 2015-11-01 MED ORDER — OMEPRAZOLE 40 MG PO CPDR: 40.0000 mg | DELAYED_RELEASE_CAPSULE | Freq: Every day | ORAL | Status: DC

## 2015-11-11 DIAGNOSIS — G4733 Obstructive sleep apnea (adult) (pediatric): Secondary | ICD-10-CM | POA: Diagnosis not present

## 2015-11-11 DIAGNOSIS — J4540 Moderate persistent asthma, uncomplicated: Secondary | ICD-10-CM | POA: Diagnosis not present

## 2015-11-11 DIAGNOSIS — K219 Gastro-esophageal reflux disease without esophagitis: Secondary | ICD-10-CM | POA: Diagnosis not present

## 2015-11-11 DIAGNOSIS — R05 Cough: Secondary | ICD-10-CM | POA: Diagnosis not present

## 2015-11-15 DIAGNOSIS — K5080 Crohn's disease of both small and large intestine without complications: Secondary | ICD-10-CM | POA: Diagnosis not present

## 2015-11-19 ENCOUNTER — Encounter: Payer: Self-pay | Admitting: Family Medicine

## 2015-11-26 DIAGNOSIS — Z1231 Encounter for screening mammogram for malignant neoplasm of breast: Secondary | ICD-10-CM | POA: Diagnosis not present

## 2015-11-26 LAB — HM MAMMOGRAPHY: HM Mammogram: NORMAL

## 2015-11-30 ENCOUNTER — Encounter: Payer: Self-pay | Admitting: Family Medicine

## 2015-12-01 DIAGNOSIS — H26491 Other secondary cataract, right eye: Secondary | ICD-10-CM | POA: Diagnosis not present

## 2015-12-01 DIAGNOSIS — Z961 Presence of intraocular lens: Secondary | ICD-10-CM | POA: Diagnosis not present

## 2015-12-01 DIAGNOSIS — I10 Essential (primary) hypertension: Secondary | ICD-10-CM | POA: Diagnosis not present

## 2015-12-01 DIAGNOSIS — H26492 Other secondary cataract, left eye: Secondary | ICD-10-CM | POA: Diagnosis not present

## 2015-12-13 ENCOUNTER — Telehealth: Payer: Self-pay | Admitting: Family Medicine

## 2015-12-13 DIAGNOSIS — J45909 Unspecified asthma, uncomplicated: Secondary | ICD-10-CM | POA: Diagnosis not present

## 2015-12-13 DIAGNOSIS — R0609 Other forms of dyspnea: Secondary | ICD-10-CM | POA: Diagnosis not present

## 2015-12-13 DIAGNOSIS — R0789 Other chest pain: Secondary | ICD-10-CM | POA: Diagnosis not present

## 2015-12-13 DIAGNOSIS — I10 Essential (primary) hypertension: Secondary | ICD-10-CM | POA: Diagnosis not present

## 2015-12-13 MED ORDER — AMLODIPINE BESYLATE 2.5 MG PO TABS: 2.5000 mg | ORAL_TABLET | Freq: Every day | ORAL | Status: DC

## 2015-12-13 NOTE — Telephone Encounter (Signed)
Pt needs refill on amlodipine #[facility] [address] in [place]

## 2015-12-13 NOTE — Telephone Encounter (Signed)
Medication sent in [facility]

## 2015-12-28 DIAGNOSIS — R0609 Other forms of dyspnea: Secondary | ICD-10-CM | POA: Diagnosis not present

## 2015-12-28 DIAGNOSIS — R0789 Other chest pain: Secondary | ICD-10-CM | POA: Diagnosis not present

## 2016-01-14 ENCOUNTER — Other Ambulatory Visit: Payer: Self-pay | Admitting: Family Medicine

## 2016-01-14 MED ORDER — CITALOPRAM HYDROBROMIDE 20 MG PO TABS: 20.0000 mg | ORAL_TABLET | Freq: Every day | ORAL | Status: DC

## 2016-01-14 MED ORDER — ATENOLOL 50 MG PO TABS: 50.0000 mg | ORAL_TABLET | Freq: Every day | ORAL | Status: DC

## 2016-01-14 NOTE — Telephone Encounter (Signed)
Yes thanks, can fill

## 2016-01-14 NOTE — Telephone Encounter (Signed)
Last seen by you on 07/20/2015  Atenolol last filled on 07/20/2015  Citalopram 06/21/2015

## 2016-01-17 ENCOUNTER — Other Ambulatory Visit: Payer: Self-pay

## 2016-01-17 ENCOUNTER — Encounter: Payer: Self-pay | Admitting: Family Medicine

## 2016-01-18 ENCOUNTER — Ambulatory Visit: Payer: Medicare Other | Admitting: Family Medicine

## 2016-01-19 ENCOUNTER — Other Ambulatory Visit: Payer: Self-pay | Admitting: *Deleted

## 2016-01-19 MED ORDER — CITALOPRAM HYDROBROMIDE 20 MG PO TABS: 20.0000 mg | ORAL_TABLET | Freq: Every day | ORAL | Status: DC

## 2016-01-19 MED ORDER — ATENOLOL 50 MG PO TABS: 50.0000 mg | ORAL_TABLET | Freq: Every day | ORAL | Status: DC

## 2016-01-27 ENCOUNTER — Ambulatory Visit (INDEPENDENT_AMBULATORY_CARE_PROVIDER_SITE_OTHER): Payer: Medicare Other | Admitting: Family Medicine

## 2016-01-27 ENCOUNTER — Encounter: Payer: Self-pay | Admitting: Family Medicine

## 2016-01-27 ENCOUNTER — Other Ambulatory Visit: Payer: Self-pay | Admitting: General Practice

## 2016-01-27 VITALS — BP 130/86 | HR 64 | Temp 98.5°F | Ht 62.0 in | Wt 166.0 lb

## 2016-01-27 DIAGNOSIS — I10 Essential (primary) hypertension: Secondary | ICD-10-CM | POA: Diagnosis not present

## 2016-01-27 DIAGNOSIS — K5090 Crohn's disease, unspecified, without complications: Secondary | ICD-10-CM | POA: Diagnosis not present

## 2016-01-27 DIAGNOSIS — J45991 Cough variant asthma: Secondary | ICD-10-CM

## 2016-01-27 DIAGNOSIS — E785 Hyperlipidemia, unspecified: Secondary | ICD-10-CM

## 2016-01-27 DIAGNOSIS — Z23 Encounter for immunization: Secondary | ICD-10-CM

## 2016-01-27 DIAGNOSIS — K219 Gastro-esophageal reflux disease without esophagitis: Secondary | ICD-10-CM

## 2016-01-27 LAB — COMPREHENSIVE METABOLIC PANEL
ALBUMIN: 4.1 g/dL (ref 3.5–5.2)
ALK PHOS: 54 U/L (ref 39–117)
ALT: 16 U/L (ref 0–35)
AST: 14 U/L (ref 0–37)
BILIRUBIN TOTAL: 0.4 mg/dL (ref 0.2–1.2)
BUN: 20 mg/dL (ref 6–23)
CO2: 27 mEq/L (ref 19–32)
Calcium: 9.4 mg/dL (ref 8.4–10.5)
Chloride: 103 mEq/L (ref 96–112)
Creatinine, Ser: 0.76 mg/dL (ref 0.40–1.20)
GFR: 80.74 mL/min (ref >60.00)
GLUCOSE: 83 mg/dL (ref 70–99)
POTASSIUM: 4.1 meq/L (ref 3.5–5.1)
SODIUM: 138 meq/L (ref 135–145)
TOTAL PROTEIN: 6.5 g/dL (ref 6.0–8.3)

## 2016-01-27 LAB — CBC
HEMATOCRIT: 42.8 % (ref 36.0–46.0)
HEMOGLOBIN: 14.2 g/dL (ref 12.0–15.0)
MCHC: 33.3 g/dL (ref 30.0–36.0)
MCV: 91.5 fl (ref 78.0–100.0)
Platelets: 218 10*3/uL (ref 150.0–400.0)
RBC: 4.68 Mil/uL (ref 3.87–5.11)
RDW: 15.1 % (ref 11.5–15.5)
WBC: 8.6 10*3/uL (ref 4.0–10.5)

## 2016-01-27 LAB — LDL CHOLESTEROL, DIRECT: LDL DIRECT: 91 mg/dL

## 2016-01-27 NOTE — Assessment & Plan Note (Signed)
S: recurrence of reflux on 8m prilosec alone A/P: continue 470momeprazole

## 2016-01-27 NOTE — Assessment & Plan Note (Signed)
S: mild poorly controlled on crestor twice a week with LDL of 91. No myalgias.   A/P: will reach out to patient ot see if she can tolerate 3x a week statin as ideally LDL under 70 with stroke history

## 2016-01-27 NOTE — Progress Notes (Signed)
Subjective:  Julia [name] is a 65 y.o. year old very pleasant female patient who presents for/with See problem oriented charting ROS- no chest pain. Occasional shortness of breath and wheeze if asthma flares.see any ROS included in HPI as well.   Past Medical History-  Patient Active Problem List   Diagnosis Date Noted  . Exposure to viral disease 07/20/2015    Priority: High  . Hemorrhagic stroke ([place]) 10/01/2014    Priority: High  . Crohn's disease ([place]) 04/05/2007    Priority: High  . Osteopenia 10/01/2014    Priority: Medium  . Depression 10/19/2010    Priority: Medium  . Hyperlipidemia 06/29/2010    Priority: Medium  . Cough variant asthma 09/15/2009    Priority: Medium  . CEREBRAL ANEURYSM 04/29/2008    Priority: Medium  . Insomnia 11/28/2007    Priority: Medium  . Migraine 04/05/2007    Priority: Medium  . Essential hypertension 04/05/2007    Priority: Medium  . Sleep apnea 04/05/2007    Priority: Medium  . Allergic rhinitis 10/01/2014    Priority: Low  . Trimalleolar fracture of left ankle 06/26/2014    Priority: Low  . DIZZINESS, CHRONIC 10/26/2009    Priority: Low  . Lumbar back pain with radiculopathy affecting left lower extremity 09/21/2008    Priority: Low  . 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 GERD 04/05/2007    Priority: Low    Medications- reviewed and updated Current Outpatient Prescriptions  Medication Sig Dispense Refill  . albuterol (PROVENTIL HFA;VENTOLIN HFA) 108 (90 BASE) MCG/ACT inhaler Inhale 2 puffs into the lungs every 6 (six) hours as needed for wheezing or shortness of breath.    Marland Kitchen amLODipine (NORVASC) 2.5 MG tablet Take 1 tablet (2.5 mg total) by mouth daily. 30 tablet 5  . aspirin 81 MG tablet Take 81 mg by mouth daily.    Marland Kitchen atenolol (TENORMIN) 50 MG tablet Take 1 tablet (50 mg total) by mouth daily. 30 tablet 5  . beclomethasone (QVAR) 80 MCG/ACT inhaler Inhale 1 puff into the lungs 2 (two)  times daily. (Patient taking differently: Inhale 1 puff into the lungs 2 (two) times daily as needed. ) 1 Inhaler 12  . butalbital-acetaminophen-caffeine (FIORICET, ESGIC) 50-325-40 MG tablet TAKE 1 TABLET BY MOUTH AS NEEDED MIGRAINE ATTACK,AS DIRECTED 30 tablet 5  . citalopram (CELEXA) 20 MG tablet Take 1 tablet (20 mg total) by mouth daily. 30 tablet 5  . cloNIDine (CATAPRES) 0.1 MG tablet Take 1 tablet (0.1 mg total) by mouth as needed (for systolic BP >751). 60 tablet 0  . fexofenadine (ALLEGRA) 180 MG tablet Take 1 tablet (180 mg total) by mouth daily. (Patient taking differently: Take 180 mg by mouth daily as needed for allergies. ) 30 tablet 11  . folic acid (FOLVITE) 1 MG tablet Take 1 mg by mouth daily.    . Multiple Vitamins-Minerals (MULTIVITAMIN WITH MINERALS) tablet Take 1 tablet by mouth daily.    Marland Kitchen omeprazole (PRILOSEC) 40 MG capsule Take 1 capsule (40 mg total) by mouth daily. 90 capsule 3  . predniSONE (DELTASONE) 10 MG tablet Take 10 mg by mouth every other day.    . rosuvastatin (CRESTOR) 20 MG tablet Take 1 tablet (20 mg total) by mouth 2 (two) times a week. Takes on Mon and Fri 30 tablet 3  . triazolam (HALCION) 0.25 MG tablet Take 1 tablet (0.25 mg total) by mouth at bedtime as needed. 30 tablet 5  No current facility-administered medications for this visit.    Objective: BP 130/86 mmHg  Pulse 64  Temp(Src) 98.5 F (36.9 C) (Oral)  Ht 5' 2"  (1.575 m)  Wt 166 lb (75.297 kg)  BMI 30.35 kg/m2 Gen: NAD, resting comfortably CV: RRR no murmurs rubs or gallops Lungs: CTAB no crackles, wheeze, rhonchi Abdomen: soft/nontender/nondistended/normal bowel sounds. No rebound or guarding.  Ext: no edema Skin: warm, dry Neuro: grossly normal, moves all extremities  Assessment/Plan:  Essential hypertension S: controlled. On amlodipine 2.23m and atenolol 586m. Has prn clonidone but hasnt had to use BP Readings from Last 3 Encounters:  01/27/16 130/86  07/20/15 124/80   05/04/15 156/88  A/P:Continue current meds:  controlled  Cough variant asthma S: restarted qvar before last visit and i encouraged regular use. Once a month or less albuterol now that is on regular qvar.  A/P: well controlled- continue current medicatoin  [place]'s disease [place]- trouble with flaring up in November 2016 with 12-15 BM and blood a day. Stopped pentasa (as more maintenance), placed on prednisone and got some better so changed to humira (3k a month though so could not afford). Imuran made her feel very ill. Left her on prednisone temporarily and got some better until las tweek and now flared up again so has appointment on Tuesday. Large source of stress for patient- counseled on dealing with stress  Hyperlipidemia S: mild poorly controlled on crestor twice a week with LDL of 91. No myalgias.   A/P: will reach out to patient ot see if she can tolerate 3x a week statin as ideally LDL under 70 with stroke history   GERD S: recurrence of reflux on 2065mrilosec alone A/P: continue 51m57meprazole    Return in about 6 months (around 07/29/2016) for physical. Return precautions advised.   Orders Placed This Encounter  Procedures  . Pneumococcal polysaccharide vaccine 23-valent greater than or equal to 2yo subcutaneous/IM  . CBC    [place]  . Comprehensive metabolic panel    [facility]  . LDL cholesterol, direct    [place]    Meds ordered this encounter  Medications  . predniSONE (DELTASONE) 10 MG tablet    Sig: Take 10 mg by mouth every other day.    StepGarret Reddish

## 2016-01-27 NOTE — Assessment & Plan Note (Signed)
S: controlled. On amlodipine 2.31m and atenolol 514m. Has prn clonidone but hasnt had to use BP Readings from Last 3 Encounters:  01/27/16 130/86  07/20/15 124/80  05/04/15 156/88  A/P:Continue current meds:  controlled

## 2016-01-27 NOTE — Assessment & Plan Note (Signed)
crohns- trouble with flaring up in November 2016 with 12-15 BM and blood a day. Stopped pentasa (as more maintenance), placed on prednisone and got some better so changed to humira (3k a month though so could not afford). Imuran made her feel very ill. Left her on prednisone temporarily and got some better until las tweek and now flared up again so has appointment on Tuesday. Large source of stress for patient- counseled on dealing with stress

## 2016-01-27 NOTE — Patient Instructions (Addendum)
No change in medications  Glad asthma is better  Sorry the Crohn's is doing worse- glad you have close follow up.   Labs before you leave  Health Maintenance Due  Topic Date Due  . PNA vac Low Risk Adult (2 of 2 - PPSV23) 10/02/2015  today

## 2016-01-27 NOTE — Assessment & Plan Note (Signed)
S: restarted qvar before last visit and i encouraged regular use. Once a month or less albuterol now that is on regular qvar.  A/P: well controlled- continue current medicatoin

## 2016-01-31 ENCOUNTER — Other Ambulatory Visit: Payer: Self-pay

## 2016-01-31 ENCOUNTER — Other Ambulatory Visit: Payer: Self-pay | Admitting: *Deleted

## 2016-01-31 MED ORDER — ATENOLOL 50 MG PO TABS: 50.0000 mg | ORAL_TABLET | Freq: Every day | ORAL | Status: DC

## 2016-01-31 MED ORDER — CITALOPRAM HYDROBROMIDE 20 MG PO TABS: 20.0000 mg | ORAL_TABLET | Freq: Every day | ORAL | Status: DC

## 2016-01-31 MED ORDER — ROSUVASTATIN CALCIUM 20 MG PO TABS: ORAL_TABLET | ORAL | Status: DC

## 2016-02-01 DIAGNOSIS — K5010 Crohn's disease of large intestine without complications: Secondary | ICD-10-CM | POA: Diagnosis not present

## 2016-02-01 DIAGNOSIS — K5080 Crohn's disease of both small and large intestine without complications: Secondary | ICD-10-CM | POA: Diagnosis not present

## 2016-02-04 ENCOUNTER — Other Ambulatory Visit: Payer: Self-pay

## 2016-02-09 DIAGNOSIS — I10 Essential (primary) hypertension: Secondary | ICD-10-CM | POA: Diagnosis not present

## 2016-02-09 DIAGNOSIS — R0609 Other forms of dyspnea: Secondary | ICD-10-CM | POA: Diagnosis not present

## 2016-02-09 DIAGNOSIS — R0789 Other chest pain: Secondary | ICD-10-CM | POA: Diagnosis not present

## 2016-02-10 DIAGNOSIS — Z87891 Personal history of nicotine dependence: Secondary | ICD-10-CM | POA: Diagnosis not present

## 2016-02-10 DIAGNOSIS — K50819 Crohn's disease of both small and large intestine with unspecified complications: Secondary | ICD-10-CM | POA: Diagnosis not present

## 2016-02-10 DIAGNOSIS — I10 Essential (primary) hypertension: Secondary | ICD-10-CM | POA: Diagnosis not present

## 2016-02-10 DIAGNOSIS — Z7982 Long term (current) use of aspirin: Secondary | ICD-10-CM | POA: Diagnosis not present

## 2016-02-10 DIAGNOSIS — J45909 Unspecified asthma, uncomplicated: Secondary | ICD-10-CM | POA: Diagnosis not present

## 2016-02-10 DIAGNOSIS — Z885 Allergy status to narcotic agent status: Secondary | ICD-10-CM | POA: Diagnosis not present

## 2016-02-10 DIAGNOSIS — Z79899 Other long term (current) drug therapy: Secondary | ICD-10-CM | POA: Diagnosis not present

## 2016-02-10 DIAGNOSIS — Z8673 Personal history of transient ischemic attack (TIA), and cerebral infarction without residual deficits: Secondary | ICD-10-CM | POA: Diagnosis not present

## 2016-02-10 DIAGNOSIS — Z880 Allergy status to penicillin: Secondary | ICD-10-CM | POA: Diagnosis not present

## 2016-02-10 DIAGNOSIS — K921 Melena: Secondary | ICD-10-CM | POA: Diagnosis not present

## 2016-02-17 ENCOUNTER — Telehealth: Payer: Self-pay | Admitting: General Practice

## 2016-02-17 NOTE — Telephone Encounter (Signed)
Yes thanks, may fill 

## 2016-02-18 ENCOUNTER — Other Ambulatory Visit: Payer: Self-pay | Admitting: General Practice

## 2016-02-18 MED ORDER — TRIAZOLAM 0.25 MG PO TABS: 0.2500 mg | ORAL_TABLET | Freq: Every evening | ORAL | Status: DC | PRN

## 2016-02-18 NOTE — Telephone Encounter (Signed)
Rx called in to pharmacy. 

## 2016-02-21 ENCOUNTER — Encounter: Payer: Self-pay | Admitting: Family Medicine

## 2016-02-21 NOTE — Telephone Encounter (Signed)
Dr. Yong Channel, okay to refill Fioricet?

## 2016-02-22 ENCOUNTER — Other Ambulatory Visit: Payer: Self-pay | Admitting: *Deleted

## 2016-02-22 MED ORDER — BUTALBITAL-APAP-CAFFEINE 50-325-40 MG PO TABS: 1.0000 | ORAL_TABLET | Freq: Every day | ORAL | Status: DC | PRN

## 2016-02-22 NOTE — Telephone Encounter (Signed)
Called [facility] and spoke to [place], Rx for Fioricet called in with specific instructions, one tablet daily prn for migraine per Dr.Hunter. Ronalee Belts verbalized understanding.

## 2016-02-24 DIAGNOSIS — R1312 Dysphagia, oropharyngeal phase: Secondary | ICD-10-CM | POA: Diagnosis not present

## 2016-02-25 IMAGING — CR DG ANKLE COMPLETE 3+V*R*
3 series · 3 of 3 positions shown · non-contrast
Comparison: None.

CLINICAL DATA: Fall 1 day ago. Right lateral malleolus pain and
swelling.

EXAM:
RIGHT ANKLE - COMPLETE 3+ VIEW

[x ankle ap right]
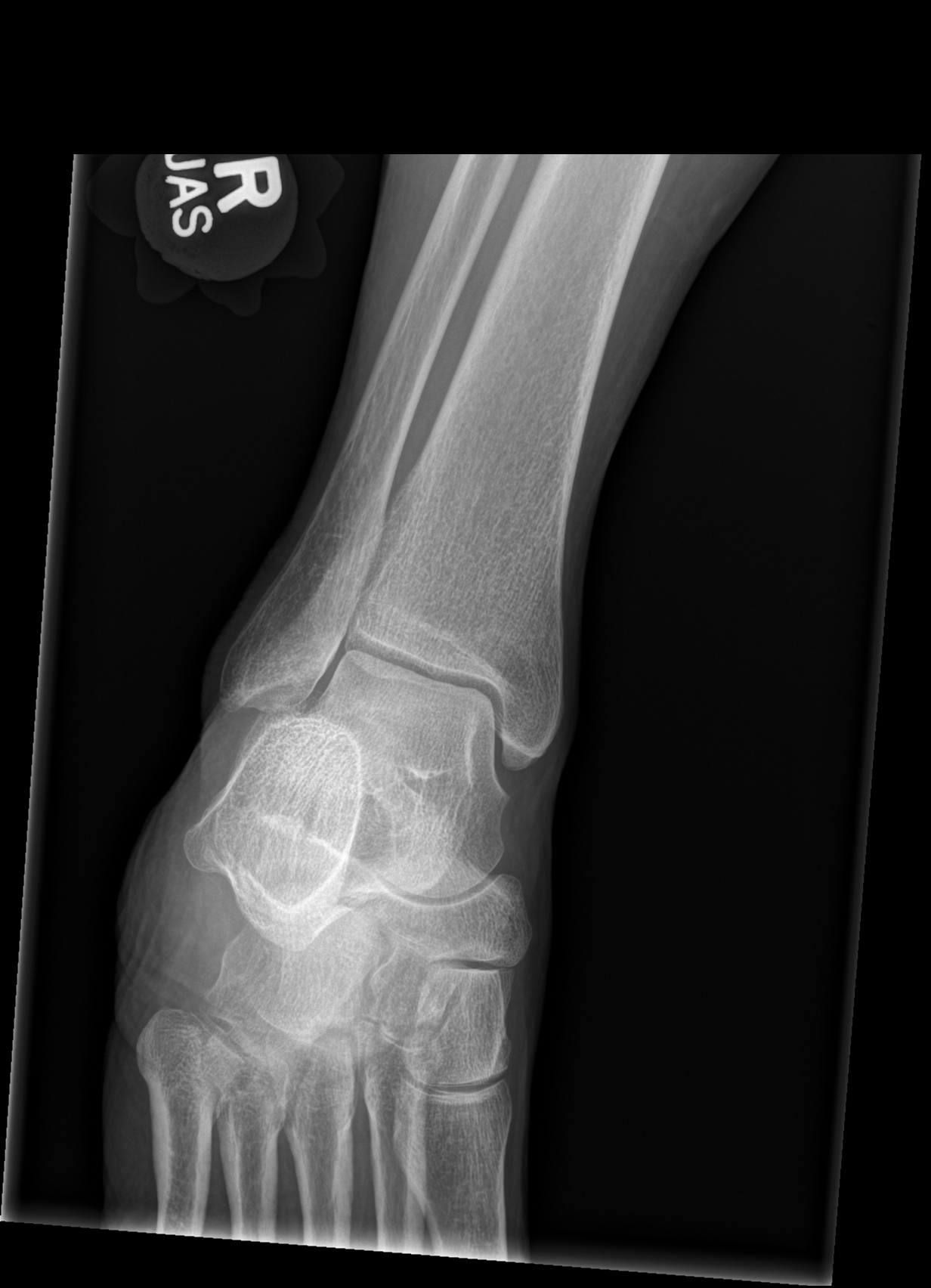

[x ankle obl right]
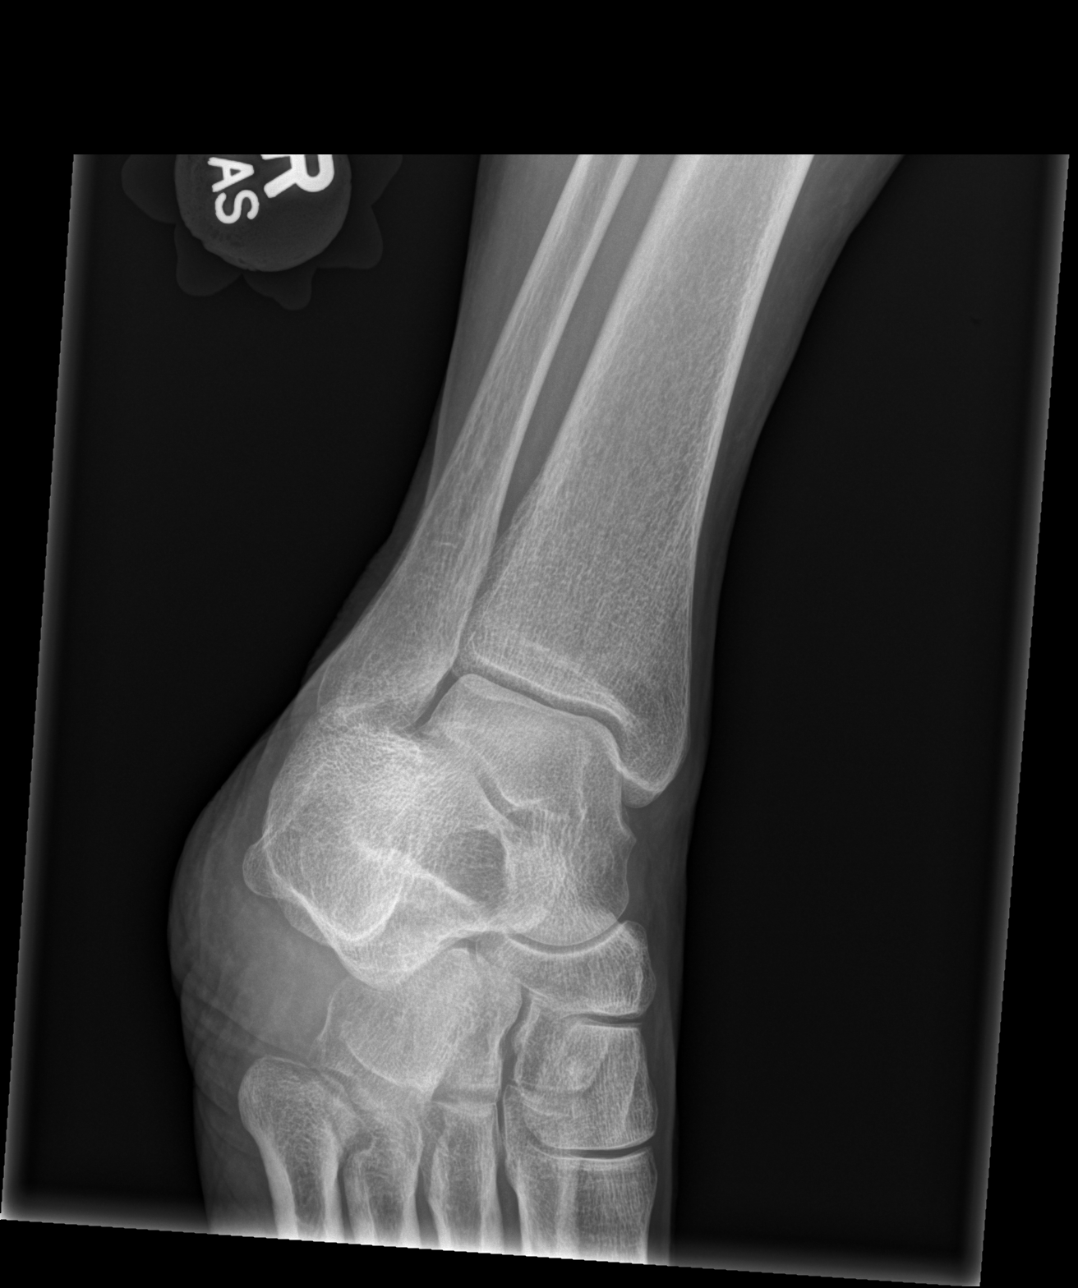

[x ankle lat right]
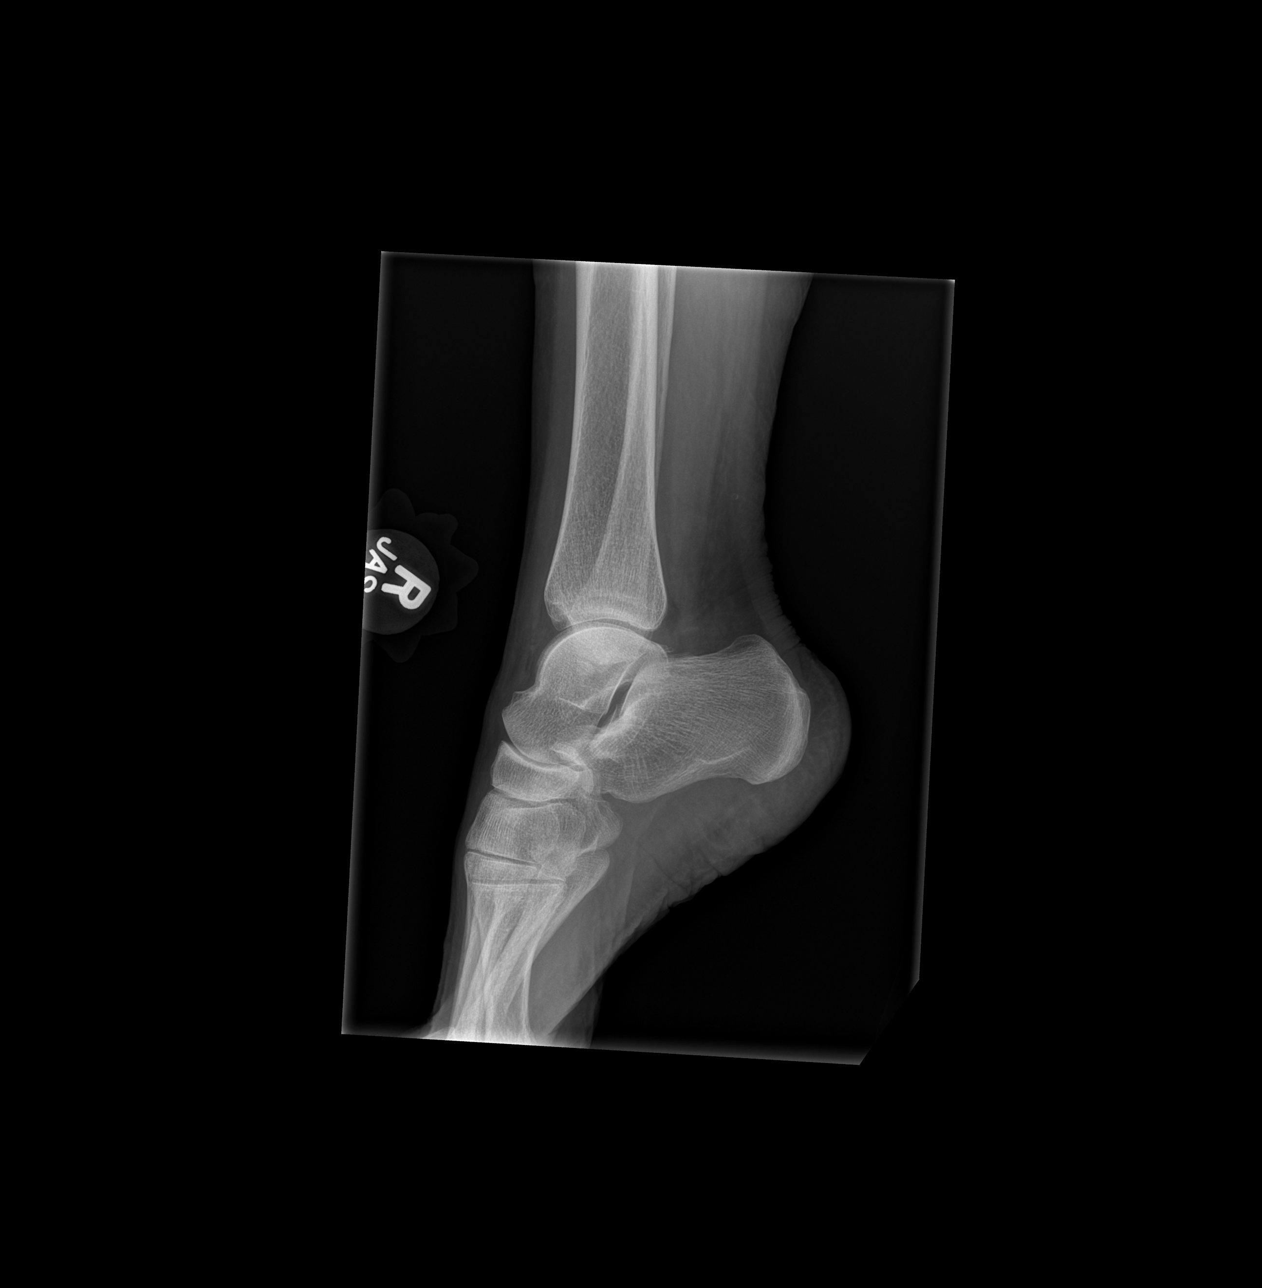

[3 of 3 positions shown; findings below may reference images not displayed]

FINDINGS: Right ankle is located. Negative for a fracture. No significant soft
tissue swelling. Alignment of the right ankle is normal.
IMPRESSION: No acute bone abnormality.

## 2016-02-25 IMAGING — CR DG KNEE 1-2V*L*
2 series · 2 of 2 positions shown · non-contrast
Comparison: None.

CLINICAL DATA: Fall 1 day ago.  Left ankle fracture.

EXAM:
LEFT KNEE - 1-2 VIEW

[x knee ap left]
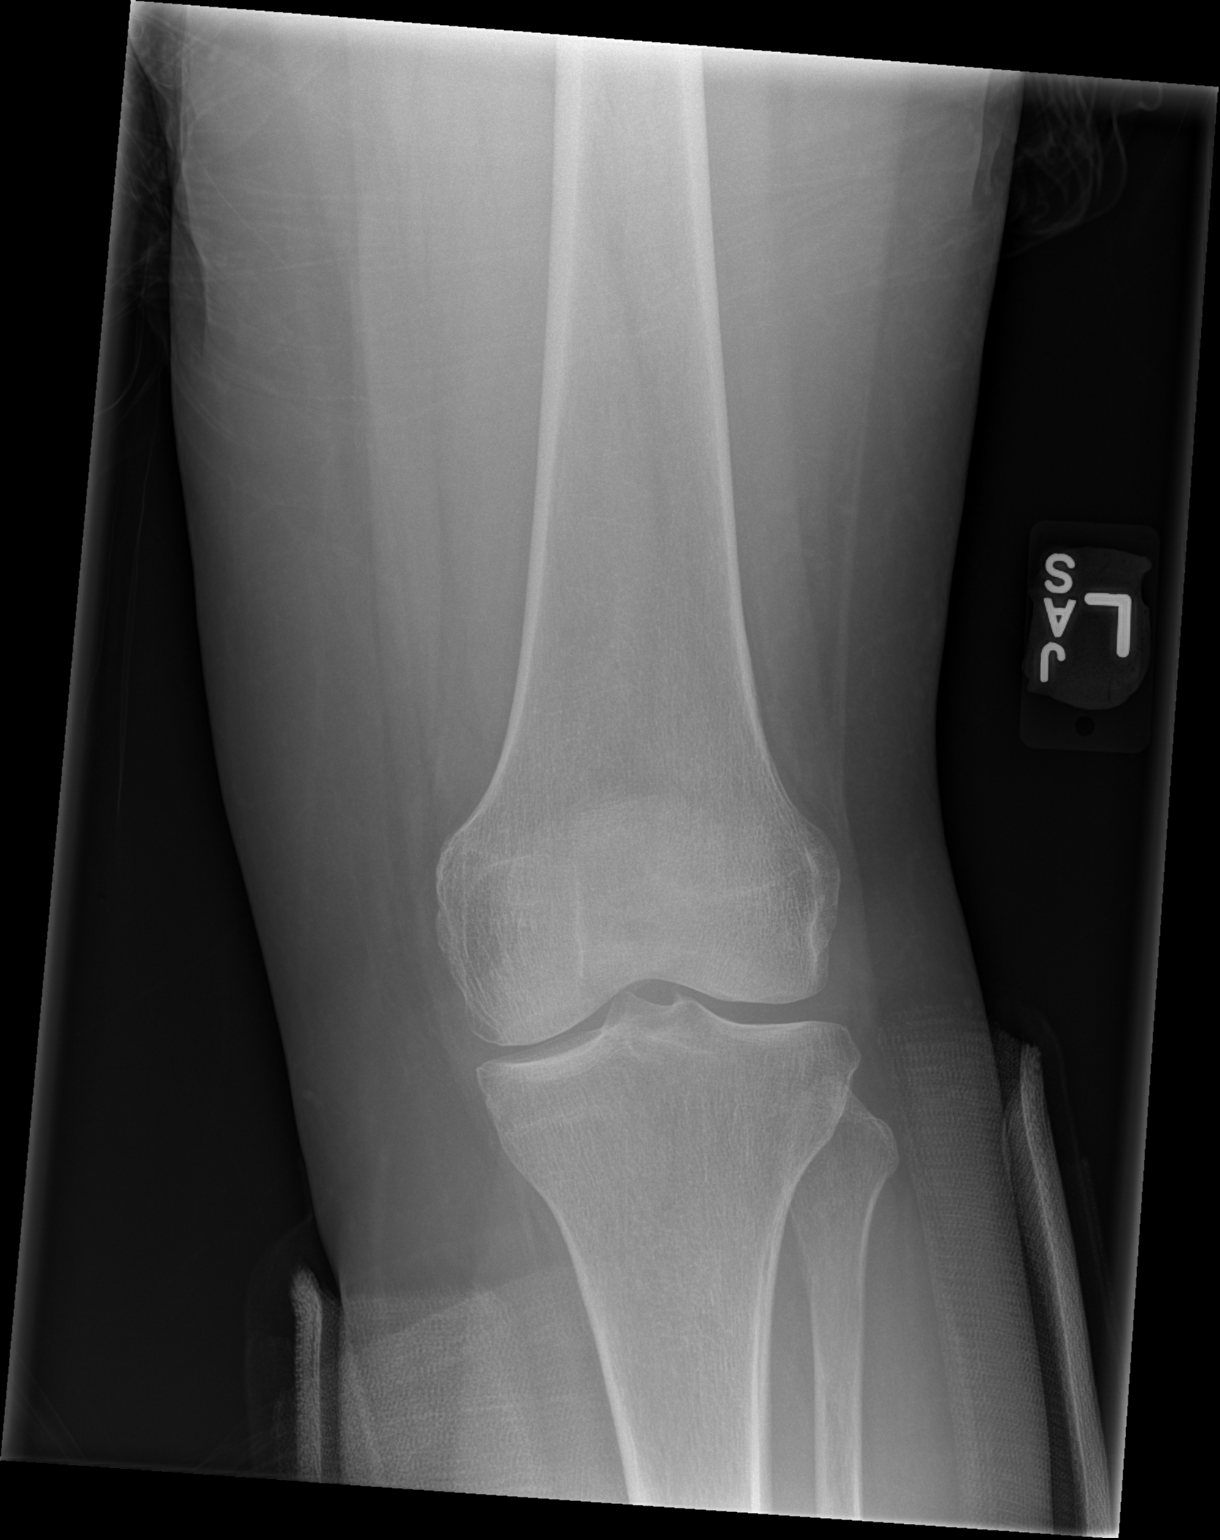

[x knee lat left]
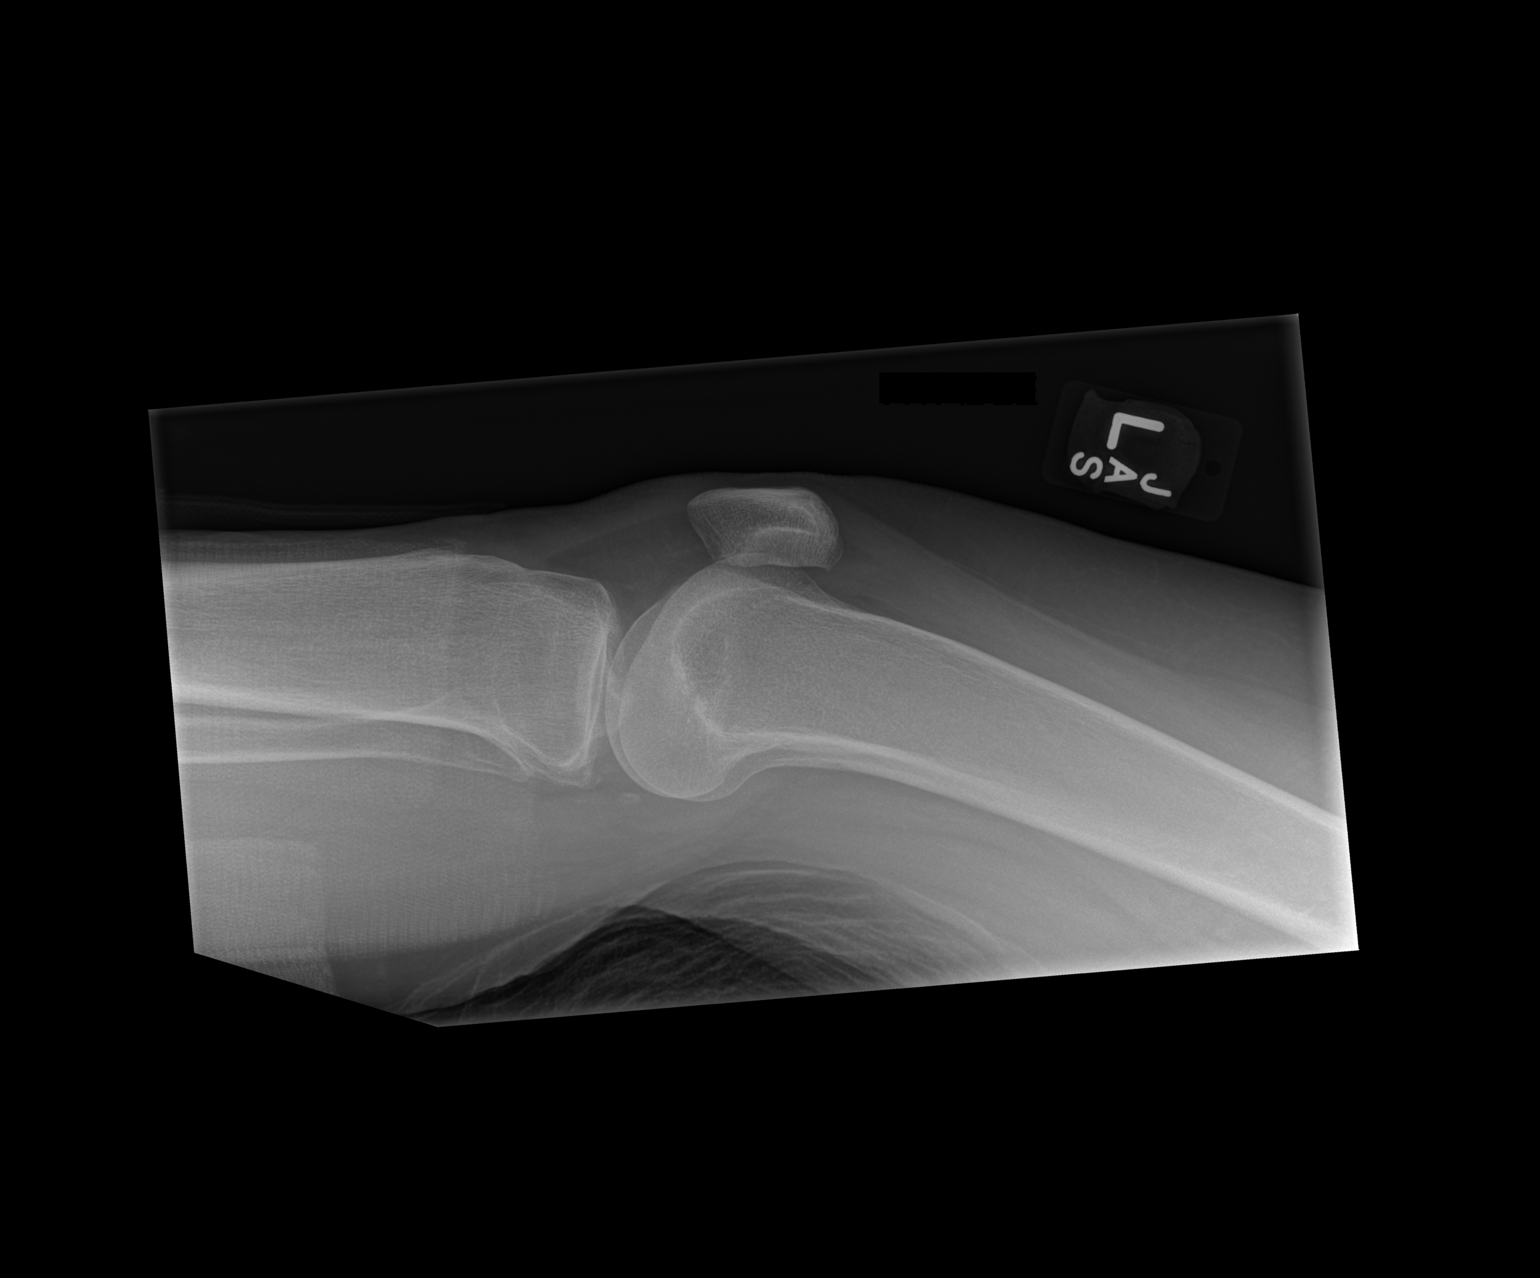

[2 of 2 positions shown; findings below may reference images not displayed]

FINDINGS: The left knee is located without a fracture. Mild joint space
narrowing and degenerative changes at the medial knee compartment.
Mild osteoarthritis in the patellofemoral compartment. No
significant joint effusion.
IMPRESSION: No acute bone abnormality to the left knee.

## 2016-02-28 ENCOUNTER — Encounter: Payer: Self-pay | Admitting: Family Medicine

## 2016-03-21 DIAGNOSIS — K529 Noninfective gastroenteritis and colitis, unspecified: Secondary | ICD-10-CM | POA: Diagnosis not present

## 2016-03-21 DIAGNOSIS — K633 Ulcer of intestine: Secondary | ICD-10-CM | POA: Diagnosis not present

## 2016-03-21 DIAGNOSIS — M5030 Other cervical disc degeneration, unspecified cervical region: Secondary | ICD-10-CM | POA: Diagnosis not present

## 2016-03-21 DIAGNOSIS — E785 Hyperlipidemia, unspecified: Secondary | ICD-10-CM | POA: Diagnosis not present

## 2016-03-21 DIAGNOSIS — K913 Postprocedural intestinal obstruction: Secondary | ICD-10-CM | POA: Diagnosis not present

## 2016-03-21 DIAGNOSIS — K5669 Other intestinal obstruction: Secondary | ICD-10-CM | POA: Diagnosis not present

## 2016-03-21 DIAGNOSIS — F329 Major depressive disorder, single episode, unspecified: Secondary | ICD-10-CM | POA: Diagnosis not present

## 2016-03-21 DIAGNOSIS — J45998 Other asthma: Secondary | ICD-10-CM | POA: Diagnosis not present

## 2016-03-21 DIAGNOSIS — G4700 Insomnia, unspecified: Secondary | ICD-10-CM | POA: Diagnosis not present

## 2016-03-21 DIAGNOSIS — I671 Cerebral aneurysm, nonruptured: Secondary | ICD-10-CM | POA: Diagnosis not present

## 2016-03-21 DIAGNOSIS — M8580 Other specified disorders of bone density and structure, unspecified site: Secondary | ICD-10-CM | POA: Diagnosis not present

## 2016-03-21 DIAGNOSIS — K5000 Crohn's disease of small intestine without complications: Secondary | ICD-10-CM | POA: Diagnosis not present

## 2016-03-21 DIAGNOSIS — K5080 Crohn's disease of both small and large intestine without complications: Secondary | ICD-10-CM | POA: Diagnosis not present

## 2016-03-21 DIAGNOSIS — K50819 Crohn's disease of both small and large intestine with unspecified complications: Secondary | ICD-10-CM | POA: Diagnosis not present

## 2016-03-21 DIAGNOSIS — R1312 Dysphagia, oropharyngeal phase: Secondary | ICD-10-CM | POA: Diagnosis not present

## 2016-04-04 DIAGNOSIS — R0609 Other forms of dyspnea: Secondary | ICD-10-CM | POA: Diagnosis not present

## 2016-04-04 DIAGNOSIS — G4733 Obstructive sleep apnea (adult) (pediatric): Secondary | ICD-10-CM | POA: Diagnosis not present

## 2016-04-10 ENCOUNTER — Encounter: Payer: Self-pay | Admitting: Family Medicine

## 2016-04-10 ENCOUNTER — Other Ambulatory Visit: Payer: Self-pay | Admitting: Family Medicine

## 2016-04-10 DIAGNOSIS — J45991 Cough variant asthma: Secondary | ICD-10-CM

## 2016-04-10 MED ORDER — CLONIDINE HCL 0.1 MG PO TABS: 0.1000 mg | ORAL_TABLET | ORAL | 0 refills | Status: DC | PRN

## 2016-04-11 ENCOUNTER — Other Ambulatory Visit: Payer: Self-pay

## 2016-04-11 MED ORDER — CLONIDINE HCL 0.1 MG PO TABS: 0.1000 mg | ORAL_TABLET | ORAL | 3 refills | Status: AC | PRN

## 2016-04-18 ENCOUNTER — Encounter: Payer: Self-pay | Admitting: Family Medicine

## 2016-04-18 DIAGNOSIS — L570 Actinic keratosis: Secondary | ICD-10-CM | POA: Diagnosis not present

## 2016-04-18 DIAGNOSIS — D1801 Hemangioma of skin and subcutaneous tissue: Secondary | ICD-10-CM | POA: Diagnosis not present

## 2016-04-18 DIAGNOSIS — L821 Other seborrheic keratosis: Secondary | ICD-10-CM | POA: Diagnosis not present

## 2016-04-21 ENCOUNTER — Telehealth: Payer: Self-pay

## 2016-04-21 NOTE — Telephone Encounter (Signed)
Prior Authorization for QVAR 80 MCG/ACT inhaler initiated   Key: [id number]

## 2016-05-08 ENCOUNTER — Telehealth: Payer: Self-pay | Admitting: Family Medicine

## 2016-05-08 NOTE — Telephone Encounter (Signed)
Have tried to submit PA online at [facility] and also tried to call in [place].  Seems pt no longer has coverage by [employer].  Left a message for the pt to return my call.  Will need updated insurance information.

## 2016-05-09 ENCOUNTER — Telehealth: Payer: Self-pay | Admitting: Family Medicine

## 2016-05-09 NOTE — Telephone Encounter (Signed)
Noted  

## 2016-05-09 NOTE — Telephone Encounter (Signed)
° ° °  Pt said her insurance has not changed and she doesn't have a new insurance card

## 2016-05-10 ENCOUNTER — Telehealth: Payer: Self-pay

## 2016-05-10 NOTE — Telephone Encounter (Signed)
PA was denied, but no further information available yet.

## 2016-05-10 NOTE — Telephone Encounter (Signed)
Received PA request from [facility] for Qvar. PA submitted & is pending. [id number]

## 2016-05-14 ENCOUNTER — Encounter: Payer: Self-pay | Admitting: Family Medicine

## 2016-05-18 ENCOUNTER — Encounter: Payer: Self-pay | Admitting: Family Medicine

## 2016-05-18 MED ORDER — FLUTICASONE PROPIONATE HFA 110 MCG/ACT IN AERO: 1.0000 | INHALATION_SPRAY | Freq: Two times a day (BID) | RESPIRATORY_TRACT | 3 refills | Status: AC

## 2016-05-31 DIAGNOSIS — Z9181 History of falling: Secondary | ICD-10-CM | POA: Diagnosis not present

## 2016-05-31 DIAGNOSIS — G4733 Obstructive sleep apnea (adult) (pediatric): Secondary | ICD-10-CM | POA: Diagnosis not present

## 2016-05-31 DIAGNOSIS — E669 Obesity, unspecified: Secondary | ICD-10-CM | POA: Diagnosis not present

## 2016-05-31 DIAGNOSIS — I619 Nontraumatic intracerebral hemorrhage, unspecified: Secondary | ICD-10-CM | POA: Diagnosis not present

## 2016-06-07 ENCOUNTER — Other Ambulatory Visit: Payer: Self-pay | Admitting: Family Medicine

## 2016-06-12 DIAGNOSIS — M19172 Post-traumatic osteoarthritis, left ankle and foot: Secondary | ICD-10-CM | POA: Diagnosis not present

## 2016-06-14 DIAGNOSIS — K50919 Crohn's disease, unspecified, with unspecified complications: Secondary | ICD-10-CM | POA: Diagnosis not present

## 2016-06-14 DIAGNOSIS — G4733 Obstructive sleep apnea (adult) (pediatric): Secondary | ICD-10-CM | POA: Diagnosis not present

## 2016-06-15 DIAGNOSIS — H04129 Dry eye syndrome of unspecified lacrimal gland: Secondary | ICD-10-CM | POA: Diagnosis not present

## 2016-06-15 DIAGNOSIS — H53469 Homonymous bilateral field defects, unspecified side: Secondary | ICD-10-CM | POA: Diagnosis not present

## 2016-06-16 DIAGNOSIS — Z23 Encounter for immunization: Secondary | ICD-10-CM | POA: Diagnosis not present

## 2016-07-04 DIAGNOSIS — G4733 Obstructive sleep apnea (adult) (pediatric): Secondary | ICD-10-CM | POA: Diagnosis not present

## 2016-07-04 DIAGNOSIS — K50919 Crohn's disease, unspecified, with unspecified complications: Secondary | ICD-10-CM | POA: Diagnosis not present

## 2016-07-07 DIAGNOSIS — Z7982 Long term (current) use of aspirin: Secondary | ICD-10-CM | POA: Diagnosis not present

## 2016-07-07 DIAGNOSIS — Z7951 Long term (current) use of inhaled steroids: Secondary | ICD-10-CM | POA: Diagnosis not present

## 2016-07-07 DIAGNOSIS — K219 Gastro-esophageal reflux disease without esophagitis: Secondary | ICD-10-CM | POA: Diagnosis not present

## 2016-07-07 DIAGNOSIS — K9189 Other postprocedural complications and disorders of digestive system: Secondary | ICD-10-CM | POA: Diagnosis not present

## 2016-07-07 DIAGNOSIS — J45909 Unspecified asthma, uncomplicated: Secondary | ICD-10-CM | POA: Diagnosis not present

## 2016-07-07 DIAGNOSIS — K50012 Crohn's disease of small intestine with intestinal obstruction: Secondary | ICD-10-CM | POA: Diagnosis not present

## 2016-07-07 DIAGNOSIS — Z87891 Personal history of nicotine dependence: Secondary | ICD-10-CM | POA: Diagnosis not present

## 2016-07-07 DIAGNOSIS — Z8673 Personal history of transient ischemic attack (TIA), and cerebral infarction without residual deficits: Secondary | ICD-10-CM | POA: Diagnosis not present

## 2016-07-07 DIAGNOSIS — Z79899 Other long term (current) drug therapy: Secondary | ICD-10-CM | POA: Diagnosis not present

## 2016-07-07 DIAGNOSIS — I10 Essential (primary) hypertension: Secondary | ICD-10-CM | POA: Diagnosis not present

## 2016-07-08 DIAGNOSIS — K9189 Other postprocedural complications and disorders of digestive system: Secondary | ICD-10-CM | POA: Diagnosis not present

## 2016-07-10 ENCOUNTER — Other Ambulatory Visit: Payer: Self-pay | Admitting: Family Medicine

## 2016-07-10 DIAGNOSIS — K5730 Diverticulosis of large intestine without perforation or abscess without bleeding: Secondary | ICD-10-CM | POA: Diagnosis not present

## 2016-07-12 DIAGNOSIS — Z79899 Other long term (current) drug therapy: Secondary | ICD-10-CM | POA: Diagnosis not present

## 2016-07-12 DIAGNOSIS — G8918 Other acute postprocedural pain: Secondary | ICD-10-CM | POA: Diagnosis not present

## 2016-07-12 DIAGNOSIS — K219 Gastro-esophageal reflux disease without esophagitis: Secondary | ICD-10-CM | POA: Diagnosis not present

## 2016-07-12 DIAGNOSIS — Z9049 Acquired absence of other specified parts of digestive tract: Secondary | ICD-10-CM | POA: Diagnosis not present

## 2016-07-12 DIAGNOSIS — R109 Unspecified abdominal pain: Secondary | ICD-10-CM | POA: Diagnosis not present

## 2016-07-12 DIAGNOSIS — Z7982 Long term (current) use of aspirin: Secondary | ICD-10-CM | POA: Diagnosis not present

## 2016-07-12 DIAGNOSIS — K50812 Crohn's disease of both small and large intestine with intestinal obstruction: Secondary | ICD-10-CM | POA: Diagnosis not present

## 2016-07-12 DIAGNOSIS — K5669 Other partial intestinal obstruction: Secondary | ICD-10-CM | POA: Diagnosis not present

## 2016-07-12 DIAGNOSIS — I69354 Hemiplegia and hemiparesis following cerebral infarction affecting left non-dominant side: Secondary | ICD-10-CM | POA: Diagnosis not present

## 2016-07-12 DIAGNOSIS — G4733 Obstructive sleep apnea (adult) (pediatric): Secondary | ICD-10-CM | POA: Diagnosis not present

## 2016-07-12 DIAGNOSIS — J45909 Unspecified asthma, uncomplicated: Secondary | ICD-10-CM | POA: Diagnosis not present

## 2016-07-12 DIAGNOSIS — I10 Essential (primary) hypertension: Secondary | ICD-10-CM | POA: Diagnosis not present

## 2016-07-12 DIAGNOSIS — K50919 Crohn's disease, unspecified, with unspecified complications: Secondary | ICD-10-CM | POA: Diagnosis not present

## 2016-07-13 DIAGNOSIS — K50812 Crohn's disease of both small and large intestine with intestinal obstruction: Secondary | ICD-10-CM | POA: Diagnosis not present

## 2016-07-13 DIAGNOSIS — G8918 Other acute postprocedural pain: Secondary | ICD-10-CM | POA: Diagnosis not present

## 2016-07-13 DIAGNOSIS — K5669 Other partial intestinal obstruction: Secondary | ICD-10-CM | POA: Diagnosis not present

## 2016-07-14 DIAGNOSIS — G8918 Other acute postprocedural pain: Secondary | ICD-10-CM | POA: Diagnosis not present

## 2016-07-14 DIAGNOSIS — R109 Unspecified abdominal pain: Secondary | ICD-10-CM | POA: Diagnosis not present

## 2016-07-15 DIAGNOSIS — G4733 Obstructive sleep apnea (adult) (pediatric): Secondary | ICD-10-CM | POA: Diagnosis not present

## 2016-07-15 DIAGNOSIS — G8918 Other acute postprocedural pain: Secondary | ICD-10-CM | POA: Diagnosis not present

## 2016-07-15 DIAGNOSIS — K50919 Crohn's disease, unspecified, with unspecified complications: Secondary | ICD-10-CM | POA: Diagnosis not present

## 2016-07-15 DIAGNOSIS — R109 Unspecified abdominal pain: Secondary | ICD-10-CM | POA: Diagnosis not present

## 2016-07-19 ENCOUNTER — Encounter: Payer: Self-pay | Admitting: Family Medicine

## 2016-07-20 DIAGNOSIS — K219 Gastro-esophageal reflux disease without esophagitis: Secondary | ICD-10-CM | POA: Diagnosis not present

## 2016-07-20 DIAGNOSIS — J45909 Unspecified asthma, uncomplicated: Secondary | ICD-10-CM | POA: Diagnosis not present

## 2016-07-20 DIAGNOSIS — Z432 Encounter for attention to ileostomy: Secondary | ICD-10-CM | POA: Diagnosis not present

## 2016-07-20 DIAGNOSIS — K50812 Crohn's disease of both small and large intestine with intestinal obstruction: Secondary | ICD-10-CM | POA: Diagnosis not present

## 2016-07-20 DIAGNOSIS — Z48815 Encounter for surgical aftercare following surgery on the digestive system: Secondary | ICD-10-CM | POA: Diagnosis not present

## 2016-07-20 DIAGNOSIS — Z7951 Long term (current) use of inhaled steroids: Secondary | ICD-10-CM | POA: Diagnosis not present

## 2016-07-20 DIAGNOSIS — I10 Essential (primary) hypertension: Secondary | ICD-10-CM | POA: Diagnosis not present

## 2016-07-27 ENCOUNTER — Encounter: Payer: Medicare Other | Admitting: Family Medicine

## 2016-08-07 ENCOUNTER — Other Ambulatory Visit: Payer: Self-pay | Admitting: Family Medicine

## 2016-08-08 DIAGNOSIS — Z87891 Personal history of nicotine dependence: Secondary | ICD-10-CM | POA: Diagnosis not present

## 2016-08-08 DIAGNOSIS — J45909 Unspecified asthma, uncomplicated: Secondary | ICD-10-CM | POA: Diagnosis not present

## 2016-08-08 DIAGNOSIS — I10 Essential (primary) hypertension: Secondary | ICD-10-CM | POA: Diagnosis not present

## 2016-08-08 DIAGNOSIS — Z79899 Other long term (current) drug therapy: Secondary | ICD-10-CM | POA: Diagnosis not present

## 2016-08-08 DIAGNOSIS — Z8673 Personal history of transient ischemic attack (TIA), and cerebral infarction without residual deficits: Secondary | ICD-10-CM | POA: Diagnosis not present

## 2016-08-08 DIAGNOSIS — Z48815 Encounter for surgical aftercare following surgery on the digestive system: Secondary | ICD-10-CM | POA: Diagnosis not present

## 2016-08-08 DIAGNOSIS — Z880 Allergy status to penicillin: Secondary | ICD-10-CM | POA: Diagnosis not present

## 2016-08-08 DIAGNOSIS — Z7982 Long term (current) use of aspirin: Secondary | ICD-10-CM | POA: Diagnosis not present

## 2016-08-08 DIAGNOSIS — Z888 Allergy status to other drugs, medicaments and biological substances status: Secondary | ICD-10-CM | POA: Diagnosis not present

## 2016-08-08 DIAGNOSIS — Z7951 Long term (current) use of inhaled steroids: Secondary | ICD-10-CM | POA: Diagnosis not present

## 2016-08-14 DIAGNOSIS — K50919 Crohn's disease, unspecified, with unspecified complications: Secondary | ICD-10-CM | POA: Diagnosis not present

## 2016-08-14 DIAGNOSIS — G4733 Obstructive sleep apnea (adult) (pediatric): Secondary | ICD-10-CM | POA: Diagnosis not present

## 2016-08-21 ENCOUNTER — Other Ambulatory Visit: Payer: Self-pay | Admitting: Family Medicine

## 2016-08-22 ENCOUNTER — Encounter: Payer: Self-pay | Admitting: Family Medicine

## 2016-08-24 DIAGNOSIS — G4733 Obstructive sleep apnea (adult) (pediatric): Secondary | ICD-10-CM | POA: Diagnosis not present

## 2016-08-24 DIAGNOSIS — I619 Nontraumatic intracerebral hemorrhage, unspecified: Secondary | ICD-10-CM | POA: Diagnosis not present

## 2016-08-24 DIAGNOSIS — I10 Essential (primary) hypertension: Secondary | ICD-10-CM | POA: Diagnosis not present

## 2016-08-25 ENCOUNTER — Encounter: Payer: Self-pay | Admitting: Family Medicine

## 2016-09-14 DIAGNOSIS — G4733 Obstructive sleep apnea (adult) (pediatric): Secondary | ICD-10-CM | POA: Diagnosis not present

## 2016-09-14 DIAGNOSIS — K50919 Crohn's disease, unspecified, with unspecified complications: Secondary | ICD-10-CM | POA: Diagnosis not present

## 2016-09-20 DIAGNOSIS — K5090 Crohn's disease, unspecified, without complications: Secondary | ICD-10-CM | POA: Diagnosis not present

## 2016-09-20 DIAGNOSIS — I619 Nontraumatic intracerebral hemorrhage, unspecified: Secondary | ICD-10-CM | POA: Diagnosis not present

## 2016-09-20 DIAGNOSIS — Z932 Ileostomy status: Secondary | ICD-10-CM | POA: Diagnosis not present

## 2016-09-26 ENCOUNTER — Encounter: Payer: Self-pay | Admitting: Family Medicine

## 2016-09-29 ENCOUNTER — Encounter: Payer: Medicare Other | Admitting: Family Medicine

## 2016-09-29 ENCOUNTER — Other Ambulatory Visit: Payer: Self-pay

## 2016-09-29 MED ORDER — ATENOLOL 25 MG PO TABS: 25.0000 mg | ORAL_TABLET | Freq: Every day | ORAL | 3 refills | Status: DC

## 2016-10-02 ENCOUNTER — Other Ambulatory Visit: Payer: Self-pay | Admitting: Family Medicine

## 2016-10-11 DIAGNOSIS — K5090 Crohn's disease, unspecified, without complications: Secondary | ICD-10-CM | POA: Diagnosis not present

## 2016-10-11 DIAGNOSIS — Z932 Ileostomy status: Secondary | ICD-10-CM | POA: Diagnosis not present

## 2016-10-11 DIAGNOSIS — K50919 Crohn's disease, unspecified, with unspecified complications: Secondary | ICD-10-CM | POA: Diagnosis not present

## 2016-10-11 DIAGNOSIS — G4733 Obstructive sleep apnea (adult) (pediatric): Secondary | ICD-10-CM | POA: Diagnosis not present

## 2016-10-16 ENCOUNTER — Telehealth: Payer: Self-pay

## 2016-10-16 NOTE — Telephone Encounter (Signed)
Called patient and confirmed that she does receive ostomy supplies from [facility].  Request was received and given to Dr. Yong Channel to sign orders

## 2016-10-17 DIAGNOSIS — L249 Irritant contact dermatitis, unspecified cause: Secondary | ICD-10-CM | POA: Diagnosis not present

## 2016-10-25 ENCOUNTER — Encounter: Payer: Self-pay | Admitting: Family Medicine

## 2016-10-25 ENCOUNTER — Ambulatory Visit (INDEPENDENT_AMBULATORY_CARE_PROVIDER_SITE_OTHER): Payer: Medicare Other | Admitting: Family Medicine

## 2016-10-25 VITALS — BP 110/74 | HR 71 | Temp 98.2°F | Ht 61.0 in | Wt 147.6 lb

## 2016-10-25 DIAGNOSIS — E785 Hyperlipidemia, unspecified: Secondary | ICD-10-CM | POA: Diagnosis not present

## 2016-10-25 DIAGNOSIS — I10 Essential (primary) hypertension: Secondary | ICD-10-CM | POA: Diagnosis not present

## 2016-10-25 DIAGNOSIS — M8580 Other specified disorders of bone density and structure, unspecified site: Secondary | ICD-10-CM

## 2016-10-25 DIAGNOSIS — Z0000 Encounter for general adult medical examination without abnormal findings: Secondary | ICD-10-CM | POA: Diagnosis not present

## 2016-10-25 LAB — CBC
HEMATOCRIT: 38.8 % (ref 36.0–46.0)
HEMOGLOBIN: 12.6 g/dL (ref 12.0–15.0)
MCHC: 32.5 g/dL (ref 30.0–36.0)
MCV: 90.7 fl (ref 78.0–100.0)
PLATELETS: 363 10*3/uL (ref 150.0–400.0)
RBC: 4.28 Mil/uL (ref 3.87–5.11)
RDW: 13.3 % (ref 11.5–15.5)
WBC: 5.8 10*3/uL (ref 4.0–10.5)

## 2016-10-25 NOTE — Patient Instructions (Addendum)
we will stop atenolol permanently with goal blood pressure <130/90. Will let me know if blood pressure above this.   Please stop by lab before you go  I hope for a good report for you in march.    Starting October 1st 2018, I will be transferring to our new location: [facility] (corner of [address] [employer]) [place], [place] Phone: [phone]  I would love to have you remain my patient at this new location as long as it remains convenient for you. I am excited about the opportunity to have x-ray and sports medicine in the new building but will really miss the awesome staff and physicians at [facility]. Continue to schedule appointments at [facility] and we will automatically transfer them to the [place] location starting October 1st.

## 2016-10-25 NOTE — Assessment & Plan Note (Signed)
Off amlodipine. Even on only atenolol 69m, blood pressures at home running under 1992systolic for most start. Today did not take and HR ok and BP 110- we will stop atenolol permanently with goal blood pressure <130/90. Will let me know if blood pressure above this.

## 2016-10-25 NOTE — Assessment & Plan Note (Signed)
Osteopenia in 2016 with DEXA -2.0 AP spine . We opted to postpone any discussion of medicine until feeling better after surgery.

## 2016-10-25 NOTE — Progress Notes (Signed)
Pre visit review using our clinic review tool, if applicable. No additional management support is needed unless otherwise documented below in the visit note. 

## 2016-10-25 NOTE — Progress Notes (Signed)
Phone: [phone]  Subjective:  Patient presents today for their annual physical. Chief complaint-noted.   See problem oriented charting- ROS- full  review of systems was completed and negative except for fatigue, poor appetite, feels run down post surgery.   The following were reviewed and entered/updated in epic: Past Medical History:  Diagnosis Date  . Anal fissure 04/29/2008  . BACK PAIN, LUMBAR, WITH RADICULOPATHY 09/21/2008  . Bacterial overgrowth syndrome 05/18/2011   In past, crohn's related ,now resolved   . BREAST CYSTS, BILATERAL 10/01/2007  . Carpal tunnel syndrome   . CEREBRAL ANEURYSM   . CHEST DISCOMFORT   . CHOLELITHIASIS   . COLITIS   . COMMON MIGRAINE   . CONSTIPATION   . Cough variant asthma   . CROHN'S DISEASE   . CVA (cerebrovascular accident due to intracerebral hemorrhage) ([place])   . DDD (degenerative disc disease), cervical   . DEGENERATIVE DISC DISEASE, CERVICAL SPINE   . Depression    since CVA  . Diarrhea   . Dislocation of left ankle joint 06/30/2014  . DIZZINESS, CHRONIC   . FACIAL PARESTHESIA, LEFT   . GERD   . HIATAL HERNIA   . History of kidney stones   . HYPERLIPIDEMIA, BORDERLINE   . HYPERTENSION   . ORGANIC INSOMNIA UNSPECIFIED   . Osteopenia   . Shortness of breath dyspnea    with exertion  . SLEEP APNEA   . Stroke ([place]) 200  . Trimalleolar fracture of left ankle 06/26/2014  . UNSPECIFIED VENOUS INSUFFICIENCY    Patient Active Problem List   Diagnosis Date Noted  . Exposure to viral disease 07/20/2015    Priority: High  . Hemorrhagic stroke ([place]) 10/01/2014    Priority: High  . Crohn's disease ([place]) 04/05/2007    Priority: High  . Osteopenia 10/01/2014    Priority: Medium  . Depression 10/19/2010    Priority: Medium  . Hyperlipidemia 06/29/2010    Priority: Medium  . Cough variant asthma 09/15/2009    Priority: Medium  . CEREBRAL ANEURYSM 04/29/2008    Priority: Medium  . Insomnia 11/28/2007    Priority: Medium  .  Migraine 04/05/2007    Priority: Medium  . Essential hypertension 04/05/2007    Priority: Medium  . Sleep apnea 04/05/2007    Priority: Medium  . Allergic rhinitis 10/01/2014    Priority: Low  . Trimalleolar fracture of left ankle 06/26/2014    Priority: Low  . DIZZINESS, CHRONIC 10/26/2009    Priority: Low  . Lumbar back pain with radiculopathy affecting left lower extremity 09/21/2008    Priority: Low  . 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 GERD 04/05/2007    Priority: Low   Past Surgical History:  Procedure Laterality Date  . ABDOMINAL HYSTERECTOMY    . ANKLE FRACTURE SURGERY Left 06/27/2014  . BREAST LUMPECTOMY Right   . CARPAL TUNNEL RELEASE Right   . COLECTOMY  1981   right  . COLONOSCOPY    . EXTERNAL FIXATION LEG Left 06/27/2014   Procedure: ORIF of trimalleolar fracture, without fixation of the posterior lip ;  Surgeon: Rozanna Box, MD;  Location: [facility];  Service: Orthopedics;  Laterality: Left;  . EYE SURGERY Bilateral    cataract with lens  . HARDWARE REMOVAL Left 05/04/2015   Procedure: REMOVAL OF HARDWARE LEFT ANKLE ;  Surgeon: Altamese Hertford, MD;  Location: [facility];  Service: Orthopedics;  Laterality: Left;  . TONSILLECTOMY  Family History  Problem Relation Age of Onset  . Stomach cancer Father   . Esophageal cancer Father   . Colon cancer Neg Hx     Medications- reviewed and updated Current Outpatient Prescriptions  Medication Sig Dispense Refill  . albuterol (PROVENTIL HFA;VENTOLIN HFA) 108 (90 BASE) MCG/ACT inhaler Inhale 2 puffs into the lungs every 6 (six) hours as needed for wheezing or shortness of breath.    Marland Kitchen alclomethasone (ACLOVATE) 0.05 % cream Apply 1 application topically 2 (two) times daily.    Marland Kitchen aspirin 81 MG tablet Take 81 mg by mouth daily.    Marland Kitchen atenolol (TENORMIN) 25 MG tablet Take 1 tablet (25 mg total) by mouth daily. 90 tablet 3  . butalbital-acetaminophen-caffeine (FIORICET, ESGIC)  50-325-40 MG tablet take 1 tablet by mouth once daily if needed for MIGRAINES 30 tablet 5  . citalopram (CELEXA) 20 MG tablet take 1 tablet by mouth once daily 30 tablet 4  . cloNIDine (CATAPRES) 0.1 MG tablet Take 1 tablet (0.1 mg total) by mouth as needed (for systolic BP >956). 60 tablet 3  . fexofenadine (ALLEGRA) 180 MG tablet Take 1 tablet (180 mg total) by mouth daily. (Patient taking differently: Take 180 mg by mouth daily as needed for allergies. ) 30 tablet 11  . fluticasone (FLOVENT HFA) 110 MCG/ACT inhaler Inhale 1 puff into the lungs 2 (two) times daily. 3 Inhaler 3  . folic acid (FOLVITE) 1 MG tablet Take 1 mg by mouth daily.    . Multiple Vitamins-Minerals (MULTIVITAMIN WITH MINERALS) tablet Take 1 tablet by mouth daily.    . triazolam (HALCION) 0.25 MG tablet take 1 tablet by mouth at bedtime if needed 30 tablet 4    Allergies-reviewed and updated Allergies  Allergen Reactions  . Meperidine Hcl Nausea And Vomiting  . Penicillins Itching and Rash    Social History   Social History  . Marital status: Married    Spouse name: N/A  . Number of children: 2  . Years of education: N/A   Occupational History  . retired Retired   Social History Main Topics  . Smoking status: Former Smoker    Quit date: 09/12/1979  . Smokeless tobacco: Never Used     Comment: has been quit for 35 years  . Alcohol use Yes  . Drug use: No  . Sexual activity: Not Asked   Other Topics Concern  . None   Social History Narrative   Married. 2 daughters. 6 grandkids.    Lived in [place] prior to 2010, moved to [place] for 6 years wanted property/cheap land, now relocating back to [place]. Relocating due to health difficulties and unable to keep up land.       Retired from [employer] with husband      Hobbies: gardening, cooking    Objective: BP 110/74 (BP Location: Left Arm, Patient Position: Sitting, Cuff Size: Normal)   Pulse 71   Temp 98.2 F (36.8  C) (Oral)   Ht 5' 1"  (1.549 m)   Wt 147 lb 9.6 oz (67 kg)   SpO2 95%   BMI 27.89 kg/m  Gen: NAD, resting comfortably HEENT: Mucous membranes are moist. Oropharynx normal Neck: no thyromegaly CV: RRR no murmurs rubs or gallops Lungs: CTAB no crackles, wheeze, rhonchi Abdomen: soft/nontender/nondistended/normal bowel sounds. No rebound or guarding.  Ext: no edema Skin: warm, dry Neuro: grossly normal, moves all extremities, PERRLA  Assessment/Plan:  66 y.o. female presenting for annual physical.  Health  Maintenance counseling: 1. Anticipatory guidance: Patient counseled regarding regular dental exams q6 months, eye exams yearly, wearing seatbelts.  2. Risk factor reduction:  Advised patient of need for regular exercise and diet rich and fruits and vegetables to reduce risk of heart attack and stroke. Exercise- very limited due to condition of crohns- very active. Diet-weight down 20 lbs- has to force herself to eat after surgery. .  Wt Readings from Last 3 Encounters:  10/25/16 147 lb 9.6 oz (67 kg)  01/27/16 166 lb (75.3 kg)  07/20/15 168 lb (76.2 kg)  3. Immunizations/screenings/ancillary studies Immunization History  Administered Date(s) Administered  . Influenza Split 06/07/2011  . Influenza Whole 07/08/2007, 06/11/2009, 06/11/2010  . Influenza,inj,Quad PF,36+ Mos 06/30/2013, 07/20/2015  . Influenza-Unspecified 06/24/2014, 06/16/2016  . Pneumococcal Conjugate-13 10/01/2014  . Pneumococcal Polysaccharide-23 04/05/2009, 01/27/2016  . Td 04/05/2009  . Zoster 06/29/2010  4. Cervical cancer screening-  Cervix removed in past due to cervical dysplasia along with uterus. Still has ovaries.  5. Breast cancer screening-  breast exam declined and mammogram 11/26/15 6. Colon cancer screening - 11/26/14 with 10 year repeat. 2017 also had one in July before surgery- do not have records 7. Skin cancer screening- Dr. 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 She is recovering reasonably well but doesn't feel like she has bouned back as much- has not had ostomy bag reversed this in January as planned. Has follow up in march. Large output from site- fighting dehydration.   History hemorrhagic stroke in 2007- she is on aspirin now and has been oked by neurology.   Insomnia on triazolam 1/2 tablet  Depression controlled on celexa 74m for most part- struggle has been with not doing well from surgery. Just doesn't feel well. No SI.   Asthma- albuterol prn- winter is toughest months for her usually but has done well this season   No problem-specific Assessment & Plan notes found for this encounter.   No Follow-up on file.  No orders of the defined types were placed in this encounter.   Meds ordered this encounter  Medications  . alclomethasone (ACLOVATE) 0.05 % cream    Sig: Apply 1 application topically 2 (two) times daily.    Return precautions advised.   SGarret Reddish MD

## 2016-10-25 NOTE — Assessment & Plan Note (Signed)
Off since crohns flare then surgery then feeling poorly. Consider restart once we get patient in better overall physical condition.

## 2016-10-26 LAB — COMPREHENSIVE METABOLIC PANEL
ALK PHOS: 188 U/L — AB (ref 39–117)
ALT: 68 U/L — AB (ref 0–35)
AST: 47 U/L — AB (ref 0–37)
Albumin: 3.9 g/dL (ref 3.5–5.2)
BILIRUBIN TOTAL: 0.3 mg/dL (ref 0.2–1.2)
BUN: 21 mg/dL (ref 6–23)
CALCIUM: 9.6 mg/dL (ref 8.4–10.5)
CO2: 21 meq/L (ref 19–32)
CREATININE: 1.19 mg/dL (ref 0.40–1.20)
Chloride: 94 mEq/L — ABNORMAL LOW (ref 96–112)
GFR: 48.02 mL/min — AB (ref >60.00)
Glucose, Bld: 93 mg/dL (ref 70–99)
Potassium: 5.9 mEq/L — ABNORMAL HIGH (ref 3.5–5.1)
Sodium: 123 mEq/L — ABNORMAL LOW (ref 135–145)
TOTAL PROTEIN: 7.3 g/dL (ref 6.0–8.3)

## 2016-10-26 LAB — LIPID PANEL
CHOL/HDL RATIO: 3
Cholesterol: 177 mg/dL (ref 0–200)
HDL: 62.5 mg/dL (ref >39.00)
LDL Cholesterol: 84 mg/dL (ref 0–99)
NONHDL: 114.15
TRIGLYCERIDES: 152 mg/dL — AB (ref 0.0–149.0)
VLDL: 30.4 mg/dL (ref 0.0–40.0)

## 2016-10-26 LAB — TSH: TSH: 2.24 u[IU]/mL (ref 0.35–4.50)

## 2016-10-27 DIAGNOSIS — E785 Hyperlipidemia, unspecified: Secondary | ICD-10-CM | POA: Diagnosis not present

## 2016-10-27 LAB — HEPATIC FUNCTION PANEL
ALK PHOS: 226 U/L — AB (ref 25–125)
ALT: 98 U/L — AB (ref 7–35)
AST: 79 U/L — AB (ref 13–35)
Bilirubin, Total: 0.2 mg/dL

## 2016-10-27 LAB — BASIC METABOLIC PANEL
BUN: 25 mg/dL — AB (ref 4–21)
CREATININE: 1.1 mg/dL (ref 0.5–1.1)
GLUCOSE: 85 mg/dL
Potassium: 6.5 mmol/L — AB (ref 3.4–5.3)
SODIUM: 127 mmol/L — AB (ref 137–147)

## 2016-10-30 ENCOUNTER — Encounter: Payer: Self-pay | Admitting: Family Medicine

## 2016-11-01 ENCOUNTER — Encounter: Payer: Self-pay | Admitting: Family Medicine

## 2016-11-02 DIAGNOSIS — E871 Hypo-osmolality and hyponatremia: Secondary | ICD-10-CM | POA: Diagnosis not present

## 2016-11-02 DIAGNOSIS — F329 Major depressive disorder, single episode, unspecified: Secondary | ICD-10-CM | POA: Diagnosis not present

## 2016-11-02 DIAGNOSIS — K5090 Crohn's disease, unspecified, without complications: Secondary | ICD-10-CM | POA: Diagnosis not present

## 2016-11-02 DIAGNOSIS — K529 Noninfective gastroenteritis and colitis, unspecified: Secondary | ICD-10-CM | POA: Diagnosis not present

## 2016-11-02 DIAGNOSIS — J45909 Unspecified asthma, uncomplicated: Secondary | ICD-10-CM | POA: Diagnosis not present

## 2016-11-02 DIAGNOSIS — R42 Dizziness and giddiness: Secondary | ICD-10-CM | POA: Diagnosis not present

## 2016-11-02 DIAGNOSIS — Z87891 Personal history of nicotine dependence: Secondary | ICD-10-CM | POA: Diagnosis not present

## 2016-11-02 DIAGNOSIS — E2740 Unspecified adrenocortical insufficiency: Secondary | ICD-10-CM | POA: Diagnosis not present

## 2016-11-02 DIAGNOSIS — R0602 Shortness of breath: Secondary | ICD-10-CM | POA: Diagnosis not present

## 2016-11-02 DIAGNOSIS — E875 Hyperkalemia: Secondary | ICD-10-CM | POA: Diagnosis not present

## 2016-11-02 DIAGNOSIS — N179 Acute kidney failure, unspecified: Secondary | ICD-10-CM | POA: Diagnosis not present

## 2016-11-02 DIAGNOSIS — Z932 Ileostomy status: Secondary | ICD-10-CM | POA: Diagnosis not present

## 2016-11-02 DIAGNOSIS — I10 Essential (primary) hypertension: Secondary | ICD-10-CM | POA: Diagnosis not present

## 2016-11-02 DIAGNOSIS — E860 Dehydration: Secondary | ICD-10-CM | POA: Diagnosis not present

## 2016-11-02 DIAGNOSIS — R002 Palpitations: Secondary | ICD-10-CM | POA: Diagnosis not present

## 2016-11-02 DIAGNOSIS — I959 Hypotension, unspecified: Secondary | ICD-10-CM | POA: Diagnosis not present

## 2016-11-02 DIAGNOSIS — G4733 Obstructive sleep apnea (adult) (pediatric): Secondary | ICD-10-CM | POA: Diagnosis not present

## 2016-11-03 ENCOUNTER — Encounter: Payer: Self-pay | Admitting: Family Medicine

## 2016-11-03 DIAGNOSIS — Z87891 Personal history of nicotine dependence: Secondary | ICD-10-CM | POA: Diagnosis not present

## 2016-11-03 DIAGNOSIS — I959 Hypotension, unspecified: Secondary | ICD-10-CM | POA: Diagnosis not present

## 2016-11-03 DIAGNOSIS — E875 Hyperkalemia: Secondary | ICD-10-CM | POA: Diagnosis not present

## 2016-11-03 DIAGNOSIS — K5090 Crohn's disease, unspecified, without complications: Secondary | ICD-10-CM | POA: Diagnosis not present

## 2016-11-03 DIAGNOSIS — E2740 Unspecified adrenocortical insufficiency: Secondary | ICD-10-CM | POA: Diagnosis not present

## 2016-11-03 DIAGNOSIS — E871 Hypo-osmolality and hyponatremia: Secondary | ICD-10-CM | POA: Diagnosis not present

## 2016-11-03 DIAGNOSIS — Z932 Ileostomy status: Secondary | ICD-10-CM | POA: Diagnosis not present

## 2016-11-04 DIAGNOSIS — E871 Hypo-osmolality and hyponatremia: Secondary | ICD-10-CM | POA: Diagnosis not present

## 2016-11-04 DIAGNOSIS — K529 Noninfective gastroenteritis and colitis, unspecified: Secondary | ICD-10-CM | POA: Diagnosis not present

## 2016-11-04 DIAGNOSIS — E875 Hyperkalemia: Secondary | ICD-10-CM | POA: Diagnosis not present

## 2016-11-04 DIAGNOSIS — I959 Hypotension, unspecified: Secondary | ICD-10-CM | POA: Diagnosis not present

## 2016-11-04 DIAGNOSIS — K5090 Crohn's disease, unspecified, without complications: Secondary | ICD-10-CM | POA: Diagnosis not present

## 2016-11-04 DIAGNOSIS — E2740 Unspecified adrenocortical insufficiency: Secondary | ICD-10-CM | POA: Diagnosis not present

## 2016-11-04 DIAGNOSIS — Z932 Ileostomy status: Secondary | ICD-10-CM | POA: Diagnosis not present

## 2016-11-05 DIAGNOSIS — E875 Hyperkalemia: Secondary | ICD-10-CM | POA: Diagnosis not present

## 2016-11-06 DIAGNOSIS — E875 Hyperkalemia: Secondary | ICD-10-CM | POA: Diagnosis not present

## 2016-11-09 ENCOUNTER — Other Ambulatory Visit: Payer: Self-pay

## 2016-11-09 ENCOUNTER — Telehealth: Payer: Self-pay | Admitting: Family Medicine

## 2016-11-09 MED ORDER — HYDROCODONE-ACETAMINOPHEN 10-325 MG PO TABS: 1.0000 | ORAL_TABLET | Freq: Four times a day (QID) | ORAL | 0 refills | Status: DC | PRN

## 2016-11-09 NOTE — Telephone Encounter (Signed)
° °  Pt ask Roselyn Reef if you would call her.

## 2016-11-09 NOTE — Telephone Encounter (Signed)
The main concern with treating her without symptoms is that tamiflu can cause abdominal pain and nausea and given GI issues were big part of hospitalization would prefer not to use that. I would be willing to send in if she develops  body aches or fever  (even low grade in her case). Prophylaxis helps prevent a new case but would not prevent if already exposed.

## 2016-11-09 NOTE — Telephone Encounter (Signed)
Spoke with pt---she was release from [facility] on 11/06/2016. She has not developed a sore throat x1 day. Denies any fevers or other sx. Pt said that she had multiple pts around her that were DX with flu. She would like to know if there is something we can suggest or even possibly do a prophylaxis tamiflu despite the face that she does have one SX. She wanted you to know that she is currently on Hydrocodone 93m tablets BID and Choletyramine 4g BID--will up date medication list.

## 2016-11-09 NOTE — Telephone Encounter (Signed)
Called and spoke to patient who verbalized understanding. She will call if she becomes symptomatic

## 2016-11-13 ENCOUNTER — Ambulatory Visit: Payer: Medicare Other | Admitting: Family Medicine

## 2016-11-20 DIAGNOSIS — E2740 Unspecified adrenocortical insufficiency: Secondary | ICD-10-CM | POA: Diagnosis not present

## 2016-11-21 ENCOUNTER — Telehealth: Payer: Self-pay

## 2016-11-21 NOTE — Telephone Encounter (Signed)
Received PA request from [facility] for Triazolam. PA submitted & is pending. Key: [id number]

## 2016-11-22 NOTE — Telephone Encounter (Signed)
PA denied. Insurance wants patient to try Silenor first.

## 2016-11-22 NOTE — Telephone Encounter (Signed)
Patient has had good results on triazolam- does patient want to see what cost would be out of pocket or does she want me to try the silenor option. Another name is doxepin

## 2016-11-23 NOTE — Telephone Encounter (Signed)
Spoke with patient who stated she wants to see what the cost is out of pocket and she will call back and let us know what she wants to do.

## 2016-12-06 DIAGNOSIS — Z932 Ileostomy status: Secondary | ICD-10-CM | POA: Diagnosis not present

## 2016-12-13 DIAGNOSIS — Z433 Encounter for attention to colostomy: Secondary | ICD-10-CM | POA: Diagnosis not present

## 2016-12-19 DIAGNOSIS — G4733 Obstructive sleep apnea (adult) (pediatric): Secondary | ICD-10-CM | POA: Diagnosis not present

## 2016-12-19 DIAGNOSIS — K50919 Crohn's disease, unspecified, with unspecified complications: Secondary | ICD-10-CM | POA: Diagnosis not present

## 2016-12-20 DIAGNOSIS — Z932 Ileostomy status: Secondary | ICD-10-CM | POA: Diagnosis not present

## 2016-12-20 DIAGNOSIS — K5090 Crohn's disease, unspecified, without complications: Secondary | ICD-10-CM | POA: Diagnosis not present

## 2017-01-15 DIAGNOSIS — E2740 Unspecified adrenocortical insufficiency: Secondary | ICD-10-CM | POA: Diagnosis not present

## 2017-01-29 ENCOUNTER — Other Ambulatory Visit: Payer: Self-pay | Admitting: Family Medicine

## 2017-01-31 DIAGNOSIS — I951 Orthostatic hypotension: Secondary | ICD-10-CM | POA: Diagnosis not present

## 2017-01-31 DIAGNOSIS — N183 Chronic kidney disease, stage 3 (moderate): Secondary | ICD-10-CM | POA: Diagnosis not present

## 2017-02-13 DIAGNOSIS — K50018 Crohn's disease of small intestine with other complication: Secondary | ICD-10-CM | POA: Diagnosis not present

## 2017-02-15 DIAGNOSIS — K5090 Crohn's disease, unspecified, without complications: Secondary | ICD-10-CM | POA: Diagnosis not present

## 2017-02-15 DIAGNOSIS — Z932 Ileostomy status: Secondary | ICD-10-CM | POA: Diagnosis not present

## 2017-03-19 DIAGNOSIS — E2749 Other adrenocortical insufficiency: Secondary | ICD-10-CM | POA: Diagnosis not present

## 2017-03-26 DIAGNOSIS — G8918 Other acute postprocedural pain: Secondary | ICD-10-CM | POA: Diagnosis not present

## 2017-03-26 DIAGNOSIS — K50919 Crohn's disease, unspecified, with unspecified complications: Secondary | ICD-10-CM | POA: Diagnosis not present

## 2017-03-26 DIAGNOSIS — E2740 Unspecified adrenocortical insufficiency: Secondary | ICD-10-CM | POA: Diagnosis not present

## 2017-03-26 DIAGNOSIS — K50018 Crohn's disease of small intestine with other complication: Secondary | ICD-10-CM | POA: Diagnosis not present

## 2017-03-26 DIAGNOSIS — K5000 Crohn's disease of small intestine without complications: Secondary | ICD-10-CM | POA: Diagnosis not present

## 2017-03-26 DIAGNOSIS — I69398 Other sequelae of cerebral infarction: Secondary | ICD-10-CM | POA: Diagnosis not present

## 2017-03-26 DIAGNOSIS — Z79899 Other long term (current) drug therapy: Secondary | ICD-10-CM | POA: Diagnosis not present

## 2017-03-26 DIAGNOSIS — Z432 Encounter for attention to ileostomy: Secondary | ICD-10-CM | POA: Diagnosis not present

## 2017-03-26 DIAGNOSIS — G4700 Insomnia, unspecified: Secondary | ICD-10-CM | POA: Diagnosis not present

## 2017-03-26 DIAGNOSIS — E785 Hyperlipidemia, unspecified: Secondary | ICD-10-CM | POA: Diagnosis not present

## 2017-03-26 DIAGNOSIS — G4733 Obstructive sleep apnea (adult) (pediatric): Secondary | ICD-10-CM | POA: Diagnosis not present

## 2017-03-26 DIAGNOSIS — K219 Gastro-esophageal reflux disease without esophagitis: Secondary | ICD-10-CM | POA: Diagnosis not present

## 2017-03-26 DIAGNOSIS — J45909 Unspecified asthma, uncomplicated: Secondary | ICD-10-CM | POA: Diagnosis not present

## 2017-03-28 DIAGNOSIS — G4733 Obstructive sleep apnea (adult) (pediatric): Secondary | ICD-10-CM | POA: Diagnosis not present

## 2017-03-28 DIAGNOSIS — K50919 Crohn's disease, unspecified, with unspecified complications: Secondary | ICD-10-CM | POA: Diagnosis not present

## 2017-04-13 ENCOUNTER — Other Ambulatory Visit: Payer: Self-pay

## 2017-04-13 ENCOUNTER — Other Ambulatory Visit: Payer: Self-pay | Admitting: Family Medicine

## 2017-04-13 MED ORDER — TRIAZOLAM 0.25 MG PO TABS: 0.2500 mg | ORAL_TABLET | Freq: Every evening | ORAL | 5 refills | Status: DC | PRN

## 2017-04-24 ENCOUNTER — Encounter: Payer: Self-pay | Admitting: Family Medicine

## 2017-04-30 DIAGNOSIS — E875 Hyperkalemia: Secondary | ICD-10-CM | POA: Diagnosis not present

## 2017-04-30 DIAGNOSIS — E871 Hypo-osmolality and hyponatremia: Secondary | ICD-10-CM | POA: Diagnosis not present

## 2017-04-30 DIAGNOSIS — R809 Proteinuria, unspecified: Secondary | ICD-10-CM | POA: Diagnosis not present

## 2017-05-03 DIAGNOSIS — H524 Presbyopia: Secondary | ICD-10-CM | POA: Diagnosis not present

## 2017-05-03 DIAGNOSIS — H53469 Homonymous bilateral field defects, unspecified side: Secondary | ICD-10-CM | POA: Diagnosis not present

## 2017-05-08 DIAGNOSIS — K5080 Crohn's disease of both small and large intestine without complications: Secondary | ICD-10-CM | POA: Diagnosis not present

## 2017-05-10 ENCOUNTER — Encounter: Payer: Self-pay | Admitting: Family Medicine

## 2017-05-10 ENCOUNTER — Ambulatory Visit (INDEPENDENT_AMBULATORY_CARE_PROVIDER_SITE_OTHER): Payer: Medicare Other | Admitting: Family Medicine

## 2017-05-10 VITALS — BP 118/76 | HR 85 | Temp 98.7°F | Ht 61.0 in | Wt 148.4 lb

## 2017-05-10 DIAGNOSIS — F324 Major depressive disorder, single episode, in partial remission: Secondary | ICD-10-CM | POA: Diagnosis not present

## 2017-05-10 DIAGNOSIS — N183 Chronic kidney disease, stage 3 unspecified: Secondary | ICD-10-CM

## 2017-05-10 DIAGNOSIS — G43909 Migraine, unspecified, not intractable, without status migrainosus: Secondary | ICD-10-CM | POA: Diagnosis not present

## 2017-05-10 DIAGNOSIS — K219 Gastro-esophageal reflux disease without esophagitis: Secondary | ICD-10-CM

## 2017-05-10 DIAGNOSIS — J45991 Cough variant asthma: Secondary | ICD-10-CM

## 2017-05-10 DIAGNOSIS — I10 Essential (primary) hypertension: Secondary | ICD-10-CM

## 2017-05-10 MED ORDER — ATENOLOL 25 MG PO TABS: 25.0000 mg | ORAL_TABLET | Freq: Every day | ORAL | 3 refills | Status: AC

## 2017-05-10 MED ORDER — OMEPRAZOLE 40 MG PO CPDR: 40.0000 mg | DELAYED_RELEASE_CAPSULE | Freq: Every day | ORAL | 3 refills | Status: DC

## 2017-05-10 NOTE — Assessment & Plan Note (Signed)
S: has done well this summer with asthma- sparing use of albuterol about twice in 6 months. flovent  A/P: continue current medications

## 2017-05-10 NOTE — Progress Notes (Signed)
Subjective:  Julia [name] is a 66 y.o. year old very pleasant female patient who presents for/with See problem oriented charting ROS- no chest pain or shortness of breath. Continued regular headaches. Cough in mornings mainly- better with allegra.    Past Medical History-  Patient Active Problem List   Diagnosis Date Noted  . Exposure to viral disease 07/20/2015    Priority: High  . Hemorrhagic stroke ([place]) 10/01/2014    Priority: High  . Crohn's disease ([place]) 04/05/2007    Priority: High  . Osteopenia 10/01/2014    Priority: Medium  . Depression 10/19/2010    Priority: Medium  . Hyperlipidemia 06/29/2010    Priority: Medium  . Cough variant asthma 09/15/2009    Priority: Medium  . CEREBRAL ANEURYSM 04/29/2008    Priority: Medium  . Insomnia 11/28/2007    Priority: Medium  . Migraine 04/05/2007    Priority: Medium  . Essential hypertension 04/05/2007    Priority: Medium  . Sleep apnea 04/05/2007    Priority: Medium  . Allergic rhinitis 10/01/2014    Priority: Low  . Trimalleolar fracture of left ankle 06/26/2014    Priority: Low  . DIZZINESS, CHRONIC 10/26/2009    Priority: Low  . Lumbar back pain with radiculopathy affecting left lower extremity 09/21/2008    Priority: Low  . 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 GERD 04/05/2007    Priority: Low  . CKD (chronic kidney disease), stage III 05/11/2017    Medications- reviewed and updated Current Outpatient Prescriptions  Medication Sig Dispense Refill  . albuterol (PROVENTIL HFA;VENTOLIN HFA) 108 (90 BASE) MCG/ACT inhaler Inhale 2 puffs into the lungs every 6 (six) hours as needed for wheezing or shortness of breath.    Marland Kitchen amLODipine (NORVASC) 2.5 MG tablet Take 2.5 mg by mouth daily.    Marland Kitchen aspirin 81 MG tablet Take 81 mg by mouth daily.    . butalbital-acetaminophen-caffeine (FIORICET, ESGIC) 50-325-40 MG tablet take 1 tablet by mouth once daily if needed for MIGRAINES 30 tablet  5  . citalopram (CELEXA) 20 MG tablet TAKE 1 TABLET DAILY 30 tablet 5  . cloNIDine (CATAPRES) 0.1 MG tablet Take 1 tablet (0.1 mg total) by mouth as needed (for systolic BP >485). 60 tablet 3  . fexofenadine (ALLEGRA) 180 MG tablet Take 1 tablet (180 mg total) by mouth daily. (Patient taking differently: Take 180 mg by mouth daily as needed for allergies. ) 30 tablet 11  . fluticasone (FLOVENT HFA) 110 MCG/ACT inhaler Inhale 1 puff into the lungs 2 (two) times daily. 3 Inhaler 3  . folic acid (FOLVITE) 1 MG tablet Take 1 mg by mouth daily.    . Multiple Vitamins-Minerals (MULTIVITAMIN WITH MINERALS) tablet Take 1 tablet by mouth daily.    . triazolam (HALCION) 0.25 MG tablet Take 1 tablet (0.25 mg total) by mouth at bedtime as needed. 30 tablet 5  . atenolol (TENORMIN) 25 MG tablet Take 1 tablet (25 mg total) by mouth daily. 90 tablet 3  . omeprazole (PRILOSEC) 40 MG capsule Take 1 capsule (40 mg total) by mouth daily. 90 capsule 3   No current facility-administered medications for this visit.     Objective: BP 118/76 (BP Location: Left Arm, Patient Position: Sitting, Cuff Size: Large)   Pulse 85   Temp 98.7 F (37.1 C) (Oral)   Ht 5' 1"  (1.549 m)   Wt 148 lb 6.4 oz (67.3 kg)   SpO2 94%   BMI  28.04 kg/m  Gen: NAD, resting comfortably CV: RRR no murmurs rubs or gallops Lungs: CTAB no crackles, wheeze, rhonchi Ext: no edema Skin: warm, dry  Assessment/Plan:  Patient finally has started to gain weight back after prior weigh loss after surgery (iloeocolic anastaamosis with diverting loop ileostomy). Ostomy was reversed 03/26/17. Weight gain only a lb.   Essential hypertension S: controlled on no medication after weight loss after surgery. Recently pressures have started to go back up but also bounce around. Started on 8/15 amlodipine 2.6m. Home #s with amlodipine have ranged from 108-172 over 76-110  142/93--> 140/90 on my check  BP Readings from Last 3 Encounters:  05/10/17 118/76   10/25/16 110/74  01/27/16 130/86  A/P: We discussed blood pressure goal of <140/90. Continue current meds of amlodipine 2.5 mg but add atenolol 293mback in- she is not getting dehydrated like before. Only concern is variability of her blood pressure - she will let usKoreanow if #s getting too low or symptomatic  GERD S: wants to go back to capsule- was having to use 2 of the 20 mg tablets when she had her ostomy A/P: refilled capsules at 4034mrilosec- recurrence each time has tried 69m2mCough variant asthma S: has done well this summer with asthma- sparing use of albuterol about twice in 6 months. flovent  A/P: continue current medications   Depression S: Depression on celexa 69mg64mhe is concerned the medication is not helping her or that it ever has. She would like to try titrating off of this and her current PHQ9 at 6. She has had counseling in the past which she did not find beneficial.  No SI A/P: She would prefer to titrate off the medication-she states she has never really known that it has helped her. I actually recommended an increase in dose or change in medication to Lexapro to see if we could reduce symptoms. She declined counseling.After extended discussion, we opted to decrease her Celexa to 10 mg with precautions regarding worsening symptoms.  Could try to titrate off at 6 months if still with similar symptoms.  Migraine S: using fiorcet almost on daily basis.prior extensie workup by headache center and had been on multiple meds- also saw [facility] for same and fiorcet has been only option that has helped.  A/P: continue daily medication.   CKD (chronic kidney disease), stage III 6.19.18- [facility] GFR around 42, creatinine at 1.27 in July. Then rechecked 04/30/17 and wen tup to 47 GFR and creatinine 1.15 so improving. Plan is to just monitor at this point   Future Appointments Date Time Provider DeparWalden11/2018 10:00 AM [facility]around 06/07/2017[facility] for follow up- or sooner if needed.  Meds ordered this encounter  Medications  . amLODipine (NORVASC) 2.5 MG tablet    Sig: Take 2.5 mg by mouth daily.  . ateMarland Kitchenolol (TENORMIN) 25 MG tablet    Sig: Take 1 tablet (25 mg total) by mouth daily.    Dispense:  90 tablet    Refill:  3  . omeprazole (PRILOSEC) 40 MG capsule    Sig: Take 1 capsule (40 mg total) by mouth daily.    Dispense:  90 capsule    Refill:  3    Return precautions advised.  StephGarret Reddish

## 2017-05-10 NOTE — Patient Instructions (Addendum)
Cut celexa in half and trial over next 6 months to see if this makes a difference. Let us know if new or worsening symptoms of stress/down mood  Start atenolol 5m back, continue amlodipine 2.554m 1 month Bp recheck

## 2017-05-10 NOTE — Assessment & Plan Note (Signed)
S: controlled on no medication after weight loss after surgery. Recently pressures have started to go back up but also bounce around. Started on 8/15 amlodipine 2.4m. Home #s with amlodipine have ranged from 108-172 over 76-110  142/93--> 140/90 on my check  BP Readings from Last 3 Encounters:  05/10/17 118/76  10/25/16 110/74  01/27/16 130/86  A/P: We discussed blood pressure goal of <140/90. Continue current meds of amlodipine 2.5 mg but add atenolol 214mback in- she is not getting dehydrated like before. Only concern is variability of her blood pressure - she will let usKoreanow if #s getting too low or symptomatic

## 2017-05-10 NOTE — Assessment & Plan Note (Signed)
S: wants to go back to capsule- was having to use 2 of the 20 mg tablets when she had her ostomy A/P: refilled capsules at 41m prilosec- recurrence each time has tried 257m

## 2017-05-11 DIAGNOSIS — N183 Chronic kidney disease, stage 3 unspecified: Secondary | ICD-10-CM | POA: Insufficient documentation

## 2017-05-11 NOTE — Assessment & Plan Note (Signed)
S: using fiorcet almost on daily basis.prior extensie workup by headache center and had been on multiple meds- also saw [facility] for same and fiorcet has been only option that has helped.  A/P: continue daily medication.

## 2017-05-11 NOTE — Assessment & Plan Note (Signed)
S: Depression on celexa 82m . She is concerned the medication is not helping her or that it ever has. She would like to try titrating off of this and her current PHQ9 at 6. She has had counseling in the past which she did not find beneficial.  No SI A/P: She would prefer to titrate off the medication-she states she has never really known that it has helped her. I actually recommended an increase in dose or change in medication to Lexapro to see if we could reduce symptoms. She declined counseling.After extended discussion, we opted to decrease her Celexa to 10 mg with precautions regarding worsening symptoms.  Could try to titrate off at 6 months if still with similar symptoms.

## 2017-05-11 NOTE — Assessment & Plan Note (Signed)
6.19.18- saw Dr. Joesph July [facility] . GFR around 42, creatinine at 1.27 in July. Then rechecked 04/30/17 and wen tup to 47 GFR and creatinine 1.15 so improving. Plan is to just monitor at this point

## 2017-05-15 DIAGNOSIS — D2339 Other benign neoplasm of skin of other parts of face: Secondary | ICD-10-CM | POA: Diagnosis not present

## 2017-05-15 DIAGNOSIS — L570 Actinic keratosis: Secondary | ICD-10-CM | POA: Diagnosis not present

## 2017-05-15 DIAGNOSIS — L821 Other seborrheic keratosis: Secondary | ICD-10-CM | POA: Diagnosis not present

## 2017-05-21 DIAGNOSIS — I6930 Unspecified sequelae of cerebral infarction: Secondary | ICD-10-CM | POA: Diagnosis not present

## 2017-05-21 DIAGNOSIS — E2749 Other adrenocortical insufficiency: Secondary | ICD-10-CM | POA: Diagnosis not present

## 2017-05-21 DIAGNOSIS — N183 Chronic kidney disease, stage 3 (moderate): Secondary | ICD-10-CM | POA: Diagnosis not present

## 2017-05-21 DIAGNOSIS — I129 Hypertensive chronic kidney disease with stage 1 through stage 4 chronic kidney disease, or unspecified chronic kidney disease: Secondary | ICD-10-CM | POA: Diagnosis not present

## 2017-06-15 DIAGNOSIS — J4540 Moderate persistent asthma, uncomplicated: Secondary | ICD-10-CM | POA: Diagnosis not present

## 2017-06-15 DIAGNOSIS — J302 Other seasonal allergic rhinitis: Secondary | ICD-10-CM | POA: Diagnosis not present

## 2017-06-15 DIAGNOSIS — G4733 Obstructive sleep apnea (adult) (pediatric): Secondary | ICD-10-CM | POA: Diagnosis not present

## 2017-06-20 DIAGNOSIS — H35371 Puckering of macula, right eye: Secondary | ICD-10-CM | POA: Diagnosis not present

## 2017-06-21 ENCOUNTER — Ambulatory Visit: Payer: Medicare Other

## 2017-06-28 DIAGNOSIS — J4521 Mild intermittent asthma with (acute) exacerbation: Secondary | ICD-10-CM | POA: Diagnosis not present

## 2017-06-29 ENCOUNTER — Ambulatory Visit: Payer: Medicare Other | Admitting: *Deleted

## 2017-07-02 DIAGNOSIS — K50919 Crohn's disease, unspecified, with unspecified complications: Secondary | ICD-10-CM | POA: Diagnosis not present

## 2017-07-02 DIAGNOSIS — Z1231 Encounter for screening mammogram for malignant neoplasm of breast: Secondary | ICD-10-CM | POA: Diagnosis not present

## 2017-07-02 DIAGNOSIS — G4733 Obstructive sleep apnea (adult) (pediatric): Secondary | ICD-10-CM | POA: Diagnosis not present

## 2017-07-02 LAB — HM MAMMOGRAPHY

## 2017-07-05 ENCOUNTER — Encounter: Payer: Self-pay | Admitting: Family Medicine

## 2017-07-10 ENCOUNTER — Encounter: Payer: Self-pay | Admitting: Family Medicine

## 2017-07-12 DIAGNOSIS — H04123 Dry eye syndrome of bilateral lacrimal glands: Secondary | ICD-10-CM | POA: Diagnosis not present

## 2017-07-12 DIAGNOSIS — H53469 Homonymous bilateral field defects, unspecified side: Secondary | ICD-10-CM | POA: Diagnosis not present

## 2017-07-13 ENCOUNTER — Other Ambulatory Visit: Payer: Self-pay

## 2017-07-13 ENCOUNTER — Encounter: Payer: Self-pay | Admitting: Family Medicine

## 2017-07-13 MED ORDER — AMLODIPINE BESYLATE 2.5 MG PO TABS: 2.5000 mg | ORAL_TABLET | Freq: Every day | ORAL | 3 refills | Status: AC

## 2017-07-19 DIAGNOSIS — K529 Noninfective gastroenteritis and colitis, unspecified: Secondary | ICD-10-CM | POA: Diagnosis not present

## 2017-08-27 DIAGNOSIS — E2749 Other adrenocortical insufficiency: Secondary | ICD-10-CM | POA: Diagnosis not present

## 2017-09-06 ENCOUNTER — Other Ambulatory Visit: Payer: Self-pay | Admitting: Family Medicine

## 2017-09-13 DIAGNOSIS — M47816 Spondylosis without myelopathy or radiculopathy, lumbar region: Secondary | ICD-10-CM | POA: Diagnosis not present

## 2017-09-13 DIAGNOSIS — M1711 Unilateral primary osteoarthritis, right knee: Secondary | ICD-10-CM | POA: Diagnosis not present

## 2017-09-20 DIAGNOSIS — R197 Diarrhea, unspecified: Secondary | ICD-10-CM | POA: Diagnosis not present

## 2017-09-20 DIAGNOSIS — K5080 Crohn's disease of both small and large intestine without complications: Secondary | ICD-10-CM | POA: Diagnosis not present

## 2017-09-20 DIAGNOSIS — R1030 Lower abdominal pain, unspecified: Secondary | ICD-10-CM | POA: Diagnosis not present

## 2017-09-25 DIAGNOSIS — K5080 Crohn's disease of both small and large intestine without complications: Secondary | ICD-10-CM | POA: Diagnosis not present

## 2017-09-25 DIAGNOSIS — R1030 Lower abdominal pain, unspecified: Secondary | ICD-10-CM | POA: Diagnosis not present

## 2017-09-28 DIAGNOSIS — K5790 Diverticulosis of intestine, part unspecified, without perforation or abscess without bleeding: Secondary | ICD-10-CM | POA: Diagnosis not present

## 2017-09-28 DIAGNOSIS — N281 Cyst of kidney, acquired: Secondary | ICD-10-CM | POA: Diagnosis not present

## 2017-09-28 DIAGNOSIS — R109 Unspecified abdominal pain: Secondary | ICD-10-CM | POA: Diagnosis not present

## 2017-09-28 DIAGNOSIS — K5080 Crohn's disease of both small and large intestine without complications: Secondary | ICD-10-CM | POA: Diagnosis not present

## 2017-09-28 DIAGNOSIS — R1030 Lower abdominal pain, unspecified: Secondary | ICD-10-CM | POA: Diagnosis not present

## 2017-10-11 DIAGNOSIS — K5730 Diverticulosis of large intestine without perforation or abscess without bleeding: Secondary | ICD-10-CM | POA: Diagnosis not present

## 2017-10-11 DIAGNOSIS — R197 Diarrhea, unspecified: Secondary | ICD-10-CM | POA: Diagnosis not present

## 2017-10-11 DIAGNOSIS — K648 Other hemorrhoids: Secondary | ICD-10-CM | POA: Diagnosis not present

## 2017-10-11 DIAGNOSIS — Z980 Intestinal bypass and anastomosis status: Secondary | ICD-10-CM | POA: Diagnosis not present

## 2017-10-11 DIAGNOSIS — K6389 Other specified diseases of intestine: Secondary | ICD-10-CM | POA: Diagnosis not present

## 2017-10-11 DIAGNOSIS — K5080 Crohn's disease of both small and large intestine without complications: Secondary | ICD-10-CM | POA: Diagnosis not present

## 2017-10-30 DIAGNOSIS — J309 Allergic rhinitis, unspecified: Secondary | ICD-10-CM | POA: Diagnosis not present

## 2017-10-30 DIAGNOSIS — J4530 Mild persistent asthma, uncomplicated: Secondary | ICD-10-CM | POA: Diagnosis not present

## 2017-10-30 DIAGNOSIS — G4733 Obstructive sleep apnea (adult) (pediatric): Secondary | ICD-10-CM | POA: Diagnosis not present

## 2017-11-05 DIAGNOSIS — I10 Essential (primary) hypertension: Secondary | ICD-10-CM | POA: Diagnosis not present

## 2017-11-05 DIAGNOSIS — N181 Chronic kidney disease, stage 1: Secondary | ICD-10-CM | POA: Diagnosis not present

## 2017-11-09 ENCOUNTER — Encounter: Payer: Medicare Other | Admitting: Family Medicine

## 2017-11-10 ENCOUNTER — Other Ambulatory Visit: Payer: Self-pay | Admitting: Family Medicine

## 2017-11-20 DIAGNOSIS — K50919 Crohn's disease, unspecified, with unspecified complications: Secondary | ICD-10-CM | POA: Diagnosis not present

## 2017-11-20 DIAGNOSIS — G4733 Obstructive sleep apnea (adult) (pediatric): Secondary | ICD-10-CM | POA: Diagnosis not present

## 2017-12-19 ENCOUNTER — Other Ambulatory Visit: Payer: Self-pay | Admitting: Family Medicine

## 2018-02-01 ENCOUNTER — Encounter: Payer: Self-pay | Admitting: Family Medicine

## 2018-02-01 ENCOUNTER — Ambulatory Visit (INDEPENDENT_AMBULATORY_CARE_PROVIDER_SITE_OTHER): Payer: Medicare Other | Admitting: Family Medicine

## 2018-02-01 ENCOUNTER — Encounter

## 2018-02-01 VITALS — BP 120/86 | HR 73 | Temp 98.6°F | Ht 61.0 in | Wt 163.0 lb

## 2018-02-01 DIAGNOSIS — Z0000 Encounter for general adult medical examination without abnormal findings: Secondary | ICD-10-CM | POA: Diagnosis not present

## 2018-02-01 DIAGNOSIS — E785 Hyperlipidemia, unspecified: Secondary | ICD-10-CM

## 2018-02-01 DIAGNOSIS — N183 Chronic kidney disease, stage 3 unspecified: Secondary | ICD-10-CM

## 2018-02-01 DIAGNOSIS — K219 Gastro-esophageal reflux disease without esophagitis: Secondary | ICD-10-CM

## 2018-02-01 DIAGNOSIS — F331 Major depressive disorder, recurrent, moderate: Secondary | ICD-10-CM

## 2018-02-01 DIAGNOSIS — J45991 Cough variant asthma: Secondary | ICD-10-CM | POA: Diagnosis not present

## 2018-02-01 DIAGNOSIS — G43909 Migraine, unspecified, not intractable, without status migrainosus: Secondary | ICD-10-CM | POA: Diagnosis not present

## 2018-02-01 DIAGNOSIS — I69359 Hemiplegia and hemiparesis following cerebral infarction affecting unspecified side: Secondary | ICD-10-CM | POA: Diagnosis not present

## 2018-02-01 DIAGNOSIS — K5090 Crohn's disease, unspecified, without complications: Secondary | ICD-10-CM

## 2018-02-01 DIAGNOSIS — Z79899 Other long term (current) drug therapy: Secondary | ICD-10-CM | POA: Diagnosis not present

## 2018-02-01 DIAGNOSIS — I10 Essential (primary) hypertension: Secondary | ICD-10-CM

## 2018-02-01 LAB — COMPREHENSIVE METABOLIC PANEL
ALBUMIN: 4 g/dL (ref 3.5–5.2)
ALK PHOS: 74 U/L (ref 39–117)
ALT: 17 U/L (ref 0–35)
AST: 19 U/L (ref 0–37)
BUN: 23 mg/dL (ref 6–23)
CALCIUM: 9.7 mg/dL (ref 8.4–10.5)
CO2: 26 mEq/L (ref 19–32)
Chloride: 104 mEq/L (ref 96–112)
Creatinine, Ser: 1.11 mg/dL (ref 0.40–1.20)
GFR: 51.84 mL/min — AB (ref >60.00)
Glucose, Bld: 90 mg/dL (ref 70–99)
POTASSIUM: 4.1 meq/L (ref 3.5–5.1)
Sodium: 140 mEq/L (ref 135–145)
TOTAL PROTEIN: 7.2 g/dL (ref 6.0–8.3)
Total Bilirubin: 0.3 mg/dL (ref 0.2–1.2)

## 2018-02-01 LAB — CBC
HEMATOCRIT: 32.7 % — AB (ref 36.0–46.0)
Hemoglobin: 10.4 g/dL — ABNORMAL LOW (ref 12.0–15.0)
MCHC: 31.7 g/dL (ref 30.0–36.0)
MCV: 77.9 fl — ABNORMAL LOW (ref 78.0–100.0)
PLATELETS: 260 10*3/uL (ref 150.0–400.0)
RBC: 4.19 Mil/uL (ref 3.87–5.11)
RDW: 15 % (ref 11.5–15.5)
WBC: 6.1 10*3/uL (ref 4.0–10.5)

## 2018-02-01 LAB — LIPID PANEL
CHOLESTEROL: 206 mg/dL — AB (ref 0–200)
HDL: 67.2 mg/dL (ref >39.00)
NonHDL: 138.96
Total CHOL/HDL Ratio: 3
Triglycerides: 232 mg/dL — ABNORMAL HIGH (ref 0.0–149.0)
VLDL: 46.4 mg/dL — AB (ref 0.0–40.0)

## 2018-02-01 LAB — VITAMIN B12: Vitamin B-12: 350 pg/mL (ref 211–911)

## 2018-02-01 LAB — LDL CHOLESTEROL, DIRECT: LDL DIRECT: 99 mg/dL

## 2018-02-01 MED ORDER — ESCITALOPRAM OXALATE 10 MG PO TABS: 10.0000 mg | ORAL_TABLET | Freq: Every day | ORAL | 3 refills | Status: DC

## 2018-02-01 NOTE — Patient Instructions (Addendum)
Please stop by lab before you go  2 month follow up  Start lexapro 19m  You can get new shingles shot called shingrix at the pharmacy         Taking the medicine as directed and not missing any doses is one of the best things you can do to treat your depression.  Here are some things to keep in mind:  1) Side effects (stomach upset, some increased anxiety) may happen before you notice a benefit.  These side effects typically go away over time. 2) Changes to your dose of medicine or a change in medication all together is sometimes necessary 3) Most people need to be on medication at least 6-12 months 4) Many people will notice an improvement within two weeks but the full effect of the medication can take up to 4-6 weeks 5) Stopping the medication when you start feeling better often results in a return of symptoms 6) If you start having thoughts of hurting yourself or others after starting this medicine, call our office immediately at [phone] seek care through 911.

## 2018-02-01 NOTE — Assessment & Plan Note (Signed)
For migraines-using Fioricet on almost a daily basis.  Has had extensive work-up in the past at [facility] neurology and Fioricet ultimately was the only option

## 2018-02-01 NOTE — Progress Notes (Signed)
Bad cholesterol reasonable under 100 at 99. Total and triglycerides are high- Healthy eating  With mediterranean diet (lots of good info online) and regular exercise can help.  This is a good starting point- https://familydoctor.org/mediterranean-diet/  You are anemic- I suspect this is related to the Crohn's - I would follow up closely with your stomach doctor. Team- please set up a repeat CBC in the next week or two to make sure this is not worsening under anemia unspecified  Kidneys are stable with chronic kidney disease stage III- it is stable to slightly better from a year ago. Liver is normal. Electrolytes normal.   Your b12 was normal

## 2018-02-01 NOTE — Assessment & Plan Note (Signed)
Crohn's disease-continues to follow-up with [facility].  Now 2 years ago she had ileocolic anastomosis with diverting loop ileostomy area she feels in a much better position this year.  She is no longer having that large output that was causing dehydration last year.  She continues to have abdominal cramping and is following up with [facility] With some additional testing

## 2018-02-01 NOTE — Assessment & Plan Note (Signed)
GERD- she had breakthrough symptoms on 20 mg Prilosec-she is continuing on Prilosec 40 mg at this point.  Given long-term use we will check B12

## 2018-02-01 NOTE — Assessment & Plan Note (Signed)
Asthma (cough variant) doing well on albuterol as needed.  Winter usually toughest for her so she is okay right now. Has also had to use a little more with allergies

## 2018-02-01 NOTE — Assessment & Plan Note (Signed)
Depression- last year she was on Celexa 20 mg.  She had some struggles last year after not felt like she recovered fully from her surgery.  Last visit she was not sure if Celexa was helping her and wanted to titrate off the medicine though her PHQ 9 was 6.  I actually advised an increase in medication but she really wanted to trial weaning off so we went to 10 mg of Celexa.  She tells me she came off completely today.  They have had a lot going on including a new puppy which is causing additional work for her, working through [employer] kidney cancer and continued disability- PHQ 9 up to over 15 today.  Agrees to try Lexapro 10 mg and follow-up in 2 months.  She will see [place] sooner if needed

## 2018-02-01 NOTE — Assessment & Plan Note (Addendum)
History hemorrhagic stroke in 2007-she is on aspirin now which is been okayed by neurology. We agreed with evidence of increased hemorrhagic stroke from recent data form to stop aspirin. Mild left hemiplegia still noted

## 2018-02-01 NOTE — Assessment & Plan Note (Addendum)
Chronic kidney disease stage III-follows with [facility] nephrology.  Filtration rate has been in the 40s. Likely started with dehydration issues after high output issues surrounding surgery. Apparently GFR may have improved and may be released from nephrology next visit- we wil lupdate bmp and follow

## 2018-02-01 NOTE — Assessment & Plan Note (Signed)
Hypertension- on amlodipine 2.5 mg and atenolol 25 mg-we started the atenolol in August.  Blood pressure looks great today.

## 2018-02-01 NOTE — Progress Notes (Signed)
Phone: [phone]  Subjective:  Patient presents today for their annual physical. Chief complaint-noted.   See problem oriented charting- ROS- full  review of systems was completed and negative except for: Admits to abdominal pain and cramping-following up with GI.  Has depressed mood and anhedonia.  Some difficulty sleeping  The following were reviewed and entered/updated in epic: Past Medical History:  Diagnosis Date  . Anal fissure 04/29/2008  . BACK PAIN, LUMBAR, WITH RADICULOPATHY 09/21/2008  . Bacterial overgrowth syndrome 05/18/2011   In past, crohn's related ,now resolved   . BREAST CYSTS, BILATERAL 10/01/2007  . Carpal tunnel syndrome   . CEREBRAL ANEURYSM   . CHEST DISCOMFORT   . CHOLELITHIASIS   . COLITIS   . COMMON MIGRAINE   . CONSTIPATION   . Cough variant asthma   . CROHN'S DISEASE   . CVA (cerebrovascular accident due to intracerebral hemorrhage) ([place])   . DDD (degenerative disc disease), cervical   . DEGENERATIVE DISC DISEASE, CERVICAL SPINE   . Depression    since CVA  . Diarrhea   . Dislocation of left ankle joint 06/30/2014  . DIZZINESS, CHRONIC   . FACIAL PARESTHESIA, LEFT   . GERD   . HIATAL HERNIA   . History of kidney stones   . HYPERLIPIDEMIA, BORDERLINE   . HYPERTENSION   . ORGANIC INSOMNIA UNSPECIFIED   . Osteopenia   . Shortness of breath dyspnea    with exertion  . SLEEP APNEA   . Stroke ([place]) 200  . Trimalleolar fracture of left ankle 06/26/2014  . UNSPECIFIED VENOUS INSUFFICIENCY    Patient Active Problem List   Diagnosis Date Noted  . Exposure to viral disease 07/20/2015    Priority: High  . History of hemorrhagic stroke with residual hemiplegia ([place]) 10/01/2014    Priority: High  . Crohn's disease ([place]) 04/05/2007    Priority: High  . Osteopenia 10/01/2014    Priority: Medium  . Depression 10/19/2010    Priority: Medium  . Hyperlipidemia 06/29/2010    Priority: Medium  . Cough variant asthma 09/15/2009    Priority: Medium    . CEREBRAL ANEURYSM 04/29/2008    Priority: Medium  . Insomnia 11/28/2007    Priority: Medium  . Migraine 04/05/2007    Priority: Medium  . Essential hypertension 04/05/2007    Priority: Medium  . Sleep apnea 04/05/2007    Priority: Medium  . Allergic rhinitis 10/01/2014    Priority: Low  . Trimalleolar fracture of left ankle 06/26/2014    Priority: Low  . DIZZINESS, CHRONIC 10/26/2009    Priority: Low  . Lumbar back pain with radiculopathy affecting left lower extremity 09/21/2008    Priority: Low  . 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 GERD 04/05/2007    Priority: Low  . CKD (chronic kidney disease), stage III ([place]) 05/11/2017   Past Surgical History:  Procedure Laterality Date  . ABDOMINAL HYSTERECTOMY    . ANKLE FRACTURE SURGERY Left 06/27/2014  . BREAST LUMPECTOMY Right   . CARPAL TUNNEL RELEASE Right   . COLECTOMY  1981   right  . COLONOSCOPY    . EXTERNAL FIXATION LEG Left 06/27/2014   Procedure: ORIF of trimalleolar fracture, without fixation of the posterior lip ;  Surgeon: Rozanna Box, MD;  Location: [facility];  Service: Orthopedics;  Laterality: Left;  . EYE SURGERY Bilateral    cataract with lens  . HARDWARE REMOVAL Left 05/04/2015   Procedure: REMOVAL OF HARDWARE  LEFT ANKLE ;  Surgeon: Altamese Ladonia, MD;  Location: [facility];  Service: Orthopedics;  Laterality: Left;  . TONSILLECTOMY      Family History  Problem Relation Age of Onset  . Stomach cancer Father   . Esophageal cancer Father   . Colon cancer Neg Hx     Medications- reviewed and updated Current Outpatient Medications  Medication Sig Dispense Refill  . albuterol (PROVENTIL HFA;VENTOLIN HFA) 108 (90 BASE) MCG/ACT inhaler Inhale 2 puffs into the lungs every 6 (six) hours as needed for wheezing or shortness of breath.    Marland Kitchen amLODipine (NORVASC) 2.5 MG tablet Take 1 tablet (2.5 mg total) by mouth daily. 90 tablet 3  . atenolol (TENORMIN) 25 MG tablet Take 1  tablet (25 mg total) by mouth daily. 90 tablet 3  . butalbital-acetaminophen-caffeine (FIORICET, ESGIC) 50-325-40 MG tablet TAKE 1 TABLET BY MOUTH DAILY AS NEEDED FOR MIGRAINES 30 tablet 1  . cloNIDine (CATAPRES) 0.1 MG tablet Take 1 tablet (0.1 mg total) by mouth as needed (for systolic BP >253). 60 tablet 3  . fexofenadine (ALLEGRA) 180 MG tablet Take 1 tablet (180 mg total) by mouth daily. (Patient taking differently: Take 180 mg by mouth daily as needed for allergies. ) 30 tablet 11  . fluticasone (FLOVENT HFA) 110 MCG/ACT inhaler Inhale 1 puff into the lungs 2 (two) times daily. 3 Inhaler 3  . folic acid (FOLVITE) 1 MG tablet Take 1 mg by mouth daily.    . Multiple Vitamins-Minerals (MULTIVITAMIN WITH MINERALS) tablet Take 1 tablet by mouth daily.    Marland Kitchen omeprazole (PRILOSEC) 40 MG capsule Take 1 capsule (40 mg total) by mouth daily. 90 capsule 3  . triazolam (HALCION) 0.25 MG tablet TAKE 1 TABLET BY MOUTH EVERY NIGHT AT BEDTIME AS NEEDED 30 tablet 2  . escitalopram (LEXAPRO) 10 MG tablet Take 1 tablet (10 mg total) by mouth daily. 90 tablet 3   No current facility-administered medications for this visit.     Allergies-reviewed and updated Allergies  Allergen Reactions  . Meperidine Hcl Nausea And Vomiting  . Penicillins Itching and Rash    Social History   Social History Narrative   Married. 2 daughters. 6 grandkids.    Lived in [place] prior to 2010, moved to [place] for 6 years wanted property/cheap land, now relocating back to [place]. Relocating due to health difficulties and unable to keep up land.       Retired from [employer] with husband      Hobbies: gardening, cooking    Objective: BP 120/86 (BP Location: Left Arm, Patient Position: Sitting, Cuff Size: Normal)   Pulse 73   Temp 98.6 F (37 C) (Oral)   Ht 5' 1"  (1.549 m)   Wt 163 lb (73.9 kg)   SpO2 96%   BMI 30.80 kg/m  Gen: NAD, resting comfortably HEENT: Mucous membranes  are moist. Oropharynx normal Neck: no thyromegaly CV: RRR no murmurs rubs or gallops Lungs: CTAB no crackles, wheeze, rhonchi Abdomen: soft/diffuse abdominal pain/nondistended/normal bowel sounds. No rebound or guarding.  Ext: no edema Skin: warm, dry Neuro: grossly normal, moves all extremities, PERRLA  Assessment/Plan:  67 y.o. female presenting for annual physical.  Health Maintenance counseling: 1. Anticipatory guidance: Patient counseled regarding regular dental exams -q6 months, eye exams - yearly, wearing seatbelts.  2. Risk factor reduction:  Advised patient of need for regular exercise and diet rich and fruits and vegetables to reduce risk of heart attack and stroke. She  is back to her baseline weight in the 160s after Losing significant weight from prior surgery. exercise- walking dog some. Diet-weight is stable- reasonable diet.  Wt Readings from Last 3 Encounters:  02/01/18 163 lb (73.9 kg)  05/10/17 148 lb 6.4 oz (67.3 kg)  10/25/16 147 lb 9.6 oz (67 kg)  3. Immunizations/screenings/ancillary studies-discussed getting Shingrix at the pharmacy  Immunization History  Administered Date(s) Administered  . Hepatitis B, adult 07/28/2015  . Influenza Split 06/07/2011  . Influenza Whole 07/08/2007, 06/11/2009, 06/11/2010  . Influenza,inj,Quad PF,6+ Mos 06/30/2013, 07/20/2015  . Influenza-Unspecified 06/24/2014, 06/16/2016, 07/11/2016, 06/28/2017  . Pneumococcal Conjugate-13 10/01/2014  . Pneumococcal Polysaccharide-23 04/05/2009, 01/27/2016  . Td 04/05/2009  . Zoster 06/29/2010  4. Cervical cancer screening- cervix removed plus past age based screening. 5. Breast cancer screening-  breast exam declined and mammogram 07/02/17 6. Colon cancer screening -11/26/2014 with 10-year repeat.  Apparently also had one in 2017 due to Crohn's-she will likely have another colonoscopy before 10-year requirement. 7. Skin cancer screening-follows with Dr. Tamala Julian in [facility].  Advised regular  sunscreen use. Denies worrisome, changing, or new skin lesions.  8. Birth control/STD check-hysterectomy/no concerns 9. Osteoporosis screening at 71- osteopenia 2016. Likely related to prior prednisone. She wants to hold off for now until puppy is a little more grown up and can handle her being gone longer  Status of chronic or acute concerns   Continues triazolam half tablet for insomnia- sometimes able to go without  History of hemorrhagic stroke with residual hemiplegia ([facility]) History hemorrhagic stroke in 2007-she is on aspirin now which is been okayed by neurology. We agreed with evidence of increased hemorrhagic stroke from recent data form to stop aspirin. Mild left hemiplegia still noted  CKD (chronic kidney disease), stage III Chronic kidney disease stage III-follows with [facility] nephrology.  Filtration rate has been in the 40s. Likely started with dehydration issues after high output issues surrounding surgery. Apparently GFR may have improved and may be released from nephrology next visit- we wil lupdate bmp and follow  Crohn's disease ([place]) Crohn's disease-continues to follow-up with [facility].  Now 2 years ago she had ileocolic anastomosis with diverting loop ileostomy area she feels in a much better position this year.  She is no longer having that large output that was causing dehydration last year.  She continues to have abdominal cramping and is following up with [facility] With some additional testing  Depression Depression- last year she was on Celexa 20 mg.  She had some struggles last year after not felt like she recovered fully from her surgery.  Last visit she was not sure if Celexa was helping her and wanted to titrate off the medicine though her PHQ 9 was 6.  I actually advised an increase in medication but she really wanted to trial weaning off so we went to 10 mg of Celexa.  She tells me she came off completely today.  They have had a lot going on including a new puppy  which is causing additional work for her, working through [employer] kidney cancer and continued disability- PHQ 9 up to over 15 today.  Agrees to try Lexapro 10 mg and follow-up in 2 months.  She will see [place] sooner if needed  Cough variant asthma Asthma (cough variant) doing well on albuterol as needed.  Winter usually toughest for her so she is okay right now. Has also had to use a little more with allergies  Essential hypertension Hypertension- on amlodipine 2.5  mg and atenolol 25 mg-we started the atenolol in August.  Blood pressure looks great today.  GERD GERD- she had breakthrough symptoms on 20 mg Prilosec-she is continuing on Prilosec 40 mg at this point.  Given long-term use we will check B12  Migraine For migraines-using Fioricet on almost a daily basis.  Has had extensive work-up in the past at [facility] neurology and Fioricet ultimately was the only option  2 months. Make sure ASA off list at that point.   Lab/Order associations: Preventative health care - Plan: CBC, Comprehensive metabolic panel, Lipid panel, Vitamin B12  Hyperlipidemia, unspecified hyperlipidemia type - Plan: CBC, Comprehensive metabolic panel, Lipid panel  High risk medication use - Plan: Vitamin B12  Meds ordered this encounter  Medications  . escitalopram (LEXAPRO) 10 MG tablet    Sig: Take 1 tablet (10 mg total) by mouth daily.    Dispense:  90 tablet    Refill:  3    Return precautions advised.  Garret Reddish, MD

## 2018-02-05 ENCOUNTER — Other Ambulatory Visit: Payer: Self-pay

## 2018-02-05 DIAGNOSIS — D649 Anemia, unspecified: Secondary | ICD-10-CM

## 2018-02-07 ENCOUNTER — Other Ambulatory Visit: Payer: Medicare Other

## 2018-02-07 DIAGNOSIS — H35371 Puckering of macula, right eye: Secondary | ICD-10-CM | POA: Diagnosis not present

## 2018-02-07 DIAGNOSIS — H53452 Other localized visual field defect, left eye: Secondary | ICD-10-CM | POA: Diagnosis not present

## 2018-02-14 ENCOUNTER — Other Ambulatory Visit (INDEPENDENT_AMBULATORY_CARE_PROVIDER_SITE_OTHER): Payer: Medicare Other

## 2018-02-14 DIAGNOSIS — D649 Anemia, unspecified: Secondary | ICD-10-CM | POA: Diagnosis not present

## 2018-02-14 LAB — CBC
HEMATOCRIT: 32.3 % — AB (ref 36.0–46.0)
Hemoglobin: 10.4 g/dL — ABNORMAL LOW (ref 12.0–15.0)
MCHC: 32.2 g/dL (ref 30.0–36.0)
MCV: 76.8 fl — AB (ref 78.0–100.0)
Platelets: 247 10*3/uL (ref 150.0–400.0)
RBC: 4.21 Mil/uL (ref 3.87–5.11)
RDW: 15.1 % (ref 11.5–15.5)
WBC: 5.6 10*3/uL (ref 4.0–10.5)

## 2018-02-22 ENCOUNTER — Encounter: Payer: Self-pay | Admitting: Family Medicine

## 2018-02-25 ENCOUNTER — Other Ambulatory Visit: Payer: Self-pay | Admitting: Family Medicine

## 2018-02-25 ENCOUNTER — Other Ambulatory Visit: Payer: Self-pay

## 2018-02-25 DIAGNOSIS — Z8673 Personal history of transient ischemic attack (TIA), and cerebral infarction without residual deficits: Secondary | ICD-10-CM

## 2018-02-25 DIAGNOSIS — K50919 Crohn's disease, unspecified, with unspecified complications: Secondary | ICD-10-CM | POA: Diagnosis not present

## 2018-02-25 DIAGNOSIS — G4733 Obstructive sleep apnea (adult) (pediatric): Secondary | ICD-10-CM | POA: Diagnosis not present

## 2018-03-18 DIAGNOSIS — R1031 Right lower quadrant pain: Secondary | ICD-10-CM | POA: Diagnosis not present

## 2018-03-18 DIAGNOSIS — R195 Other fecal abnormalities: Secondary | ICD-10-CM | POA: Diagnosis not present

## 2018-03-18 DIAGNOSIS — D649 Anemia, unspecified: Secondary | ICD-10-CM | POA: Diagnosis not present

## 2018-03-18 DIAGNOSIS — K5080 Crohn's disease of both small and large intestine without complications: Secondary | ICD-10-CM | POA: Diagnosis not present

## 2018-03-19 ENCOUNTER — Other Ambulatory Visit: Payer: Self-pay | Admitting: Family Medicine

## 2018-03-21 NOTE — Telephone Encounter (Signed)
Refill request, pt has an appt July 24 th with you.

## 2018-03-26 ENCOUNTER — Encounter: Payer: Self-pay | Admitting: Family Medicine

## 2018-03-28 ENCOUNTER — Other Ambulatory Visit: Payer: Self-pay

## 2018-03-28 MED ORDER — TRIAZOLAM 0.25 MG PO TABS: 0.2500 mg | ORAL_TABLET | Freq: Every evening | ORAL | 5 refills | Status: DC | PRN

## 2018-03-28 MED ORDER — BUTALBITAL-APAP-CAFFEINE 50-325-40 MG PO TABS: ORAL_TABLET | ORAL | 1 refills | Status: AC

## 2018-04-03 ENCOUNTER — Ambulatory Visit: Payer: Medicare Other | Admitting: Family Medicine

## 2018-04-10 DIAGNOSIS — K209 Esophagitis, unspecified: Secondary | ICD-10-CM | POA: Diagnosis not present

## 2018-04-10 DIAGNOSIS — D500 Iron deficiency anemia secondary to blood loss (chronic): Secondary | ICD-10-CM | POA: Diagnosis not present

## 2018-04-10 DIAGNOSIS — K2930 Chronic superficial gastritis without bleeding: Secondary | ICD-10-CM | POA: Diagnosis not present

## 2018-04-10 DIAGNOSIS — K222 Esophageal obstruction: Secondary | ICD-10-CM | POA: Diagnosis not present

## 2018-04-10 DIAGNOSIS — K2950 Unspecified chronic gastritis without bleeding: Secondary | ICD-10-CM | POA: Diagnosis not present

## 2018-04-26 ENCOUNTER — Other Ambulatory Visit: Payer: Self-pay | Admitting: Family Medicine

## 2018-04-30 ENCOUNTER — Ambulatory Visit: Payer: Medicare Other | Admitting: Family Medicine

## 2018-05-06 DIAGNOSIS — F3341 Major depressive disorder, recurrent, in partial remission: Secondary | ICD-10-CM | POA: Diagnosis not present

## 2018-05-06 DIAGNOSIS — D539 Nutritional anemia, unspecified: Secondary | ICD-10-CM | POA: Diagnosis not present

## 2018-05-06 DIAGNOSIS — F5101 Primary insomnia: Secondary | ICD-10-CM | POA: Diagnosis not present

## 2018-05-06 DIAGNOSIS — I10 Essential (primary) hypertension: Secondary | ICD-10-CM | POA: Diagnosis not present

## 2018-05-06 DIAGNOSIS — E7849 Other hyperlipidemia: Secondary | ICD-10-CM | POA: Diagnosis not present

## 2018-05-28 ENCOUNTER — Other Ambulatory Visit: Payer: Self-pay | Admitting: Family Medicine

## 2018-06-04 DIAGNOSIS — R197 Diarrhea, unspecified: Secondary | ICD-10-CM | POA: Diagnosis not present

## 2018-06-05 DIAGNOSIS — G4733 Obstructive sleep apnea (adult) (pediatric): Secondary | ICD-10-CM | POA: Diagnosis not present

## 2018-06-05 DIAGNOSIS — K50919 Crohn's disease, unspecified, with unspecified complications: Secondary | ICD-10-CM | POA: Diagnosis not present

## 2018-06-05 DIAGNOSIS — D509 Iron deficiency anemia, unspecified: Secondary | ICD-10-CM | POA: Diagnosis not present

## 2018-06-05 DIAGNOSIS — D539 Nutritional anemia, unspecified: Secondary | ICD-10-CM | POA: Diagnosis not present

## 2018-07-04 DIAGNOSIS — Z1231 Encounter for screening mammogram for malignant neoplasm of breast: Secondary | ICD-10-CM | POA: Diagnosis not present

## 2018-07-10 DIAGNOSIS — R2689 Other abnormalities of gait and mobility: Secondary | ICD-10-CM | POA: Diagnosis not present

## 2018-08-21 DIAGNOSIS — D539 Nutritional anemia, unspecified: Secondary | ICD-10-CM | POA: Diagnosis not present

## 2018-09-16 DIAGNOSIS — J4530 Mild persistent asthma, uncomplicated: Secondary | ICD-10-CM | POA: Diagnosis not present

## 2018-09-16 DIAGNOSIS — G4733 Obstructive sleep apnea (adult) (pediatric): Secondary | ICD-10-CM | POA: Diagnosis not present

## 2018-10-03 DIAGNOSIS — L570 Actinic keratosis: Secondary | ICD-10-CM | POA: Diagnosis not present

## 2018-10-03 DIAGNOSIS — L821 Other seborrheic keratosis: Secondary | ICD-10-CM | POA: Diagnosis not present

## 2018-10-03 DIAGNOSIS — L720 Epidermal cyst: Secondary | ICD-10-CM | POA: Diagnosis not present

## 2018-10-03 DIAGNOSIS — L738 Other specified follicular disorders: Secondary | ICD-10-CM | POA: Diagnosis not present

## 2018-10-14 ENCOUNTER — Other Ambulatory Visit: Payer: Self-pay | Admitting: Family Medicine

## 2018-10-14 DIAGNOSIS — J3489 Other specified disorders of nose and nasal sinuses: Secondary | ICD-10-CM | POA: Diagnosis not present

## 2018-10-14 DIAGNOSIS — L03116 Cellulitis of left lower limb: Secondary | ICD-10-CM | POA: Diagnosis not present

## 2018-10-29 ENCOUNTER — Other Ambulatory Visit: Payer: Self-pay | Admitting: Family Medicine

## 2018-10-29 NOTE — Telephone Encounter (Signed)
Last OV 02/01/2018 Last refill 02/01/2018 #90/3 Next OV not scheduled

## 2018-10-31 DIAGNOSIS — H35371 Puckering of macula, right eye: Secondary | ICD-10-CM | POA: Diagnosis not present

## 2018-11-08 DIAGNOSIS — R110 Nausea: Secondary | ICD-10-CM | POA: Diagnosis not present

## 2018-11-08 DIAGNOSIS — R6889 Other general symptoms and signs: Secondary | ICD-10-CM | POA: Diagnosis not present

## 2018-12-04 DIAGNOSIS — G4733 Obstructive sleep apnea (adult) (pediatric): Secondary | ICD-10-CM | POA: Diagnosis not present

## 2018-12-17 DIAGNOSIS — I619 Nontraumatic intracerebral hemorrhage, unspecified: Secondary | ICD-10-CM | POA: Diagnosis not present

## 2019-02-10 DIAGNOSIS — M5442 Lumbago with sciatica, left side: Secondary | ICD-10-CM | POA: Diagnosis not present

## 2019-02-10 DIAGNOSIS — K50018 Crohn's disease of small intestine with other complication: Secondary | ICD-10-CM | POA: Diagnosis not present

## 2019-02-10 DIAGNOSIS — M5136 Other intervertebral disc degeneration, lumbar region: Secondary | ICD-10-CM | POA: Diagnosis not present

## 2019-02-10 DIAGNOSIS — M5416 Radiculopathy, lumbar region: Secondary | ICD-10-CM | POA: Diagnosis not present

## 2019-02-11 ENCOUNTER — Other Ambulatory Visit: Payer: Self-pay | Admitting: Family Medicine

## 2019-02-11 NOTE — Telephone Encounter (Signed)
Patient need to schedule an ov for more refills. 

## 2019-02-13 NOTE — Telephone Encounter (Signed)
Left message to return phone call.

## 2019-02-13 NOTE — Telephone Encounter (Signed)
Refilled for 30 days

## 2019-02-17 DIAGNOSIS — Z0000 Encounter for general adult medical examination without abnormal findings: Secondary | ICD-10-CM | POA: Diagnosis not present

## 2019-02-17 DIAGNOSIS — K50018 Crohn's disease of small intestine with other complication: Secondary | ICD-10-CM | POA: Diagnosis not present

## 2019-02-17 DIAGNOSIS — M85859 Other specified disorders of bone density and structure, unspecified thigh: Secondary | ICD-10-CM | POA: Diagnosis not present

## 2019-02-17 DIAGNOSIS — G43009 Migraine without aura, not intractable, without status migrainosus: Secondary | ICD-10-CM | POA: Diagnosis not present

## 2019-03-05 DIAGNOSIS — G4733 Obstructive sleep apnea (adult) (pediatric): Secondary | ICD-10-CM | POA: Diagnosis not present

## 2019-03-11 DIAGNOSIS — H524 Presbyopia: Secondary | ICD-10-CM | POA: Diagnosis not present

## 2019-03-18 DIAGNOSIS — M810 Age-related osteoporosis without current pathological fracture: Secondary | ICD-10-CM | POA: Diagnosis not present

## 2019-03-18 DIAGNOSIS — Z780 Asymptomatic menopausal state: Secondary | ICD-10-CM | POA: Diagnosis not present

## 2019-04-30 ENCOUNTER — Other Ambulatory Visit: Payer: Self-pay | Admitting: Family Medicine

## 2019-04-30 NOTE — Telephone Encounter (Signed)
Last OV 02/01/2018.   Please contact pt to schedule appointment.

## 2019-04-30 NOTE — Telephone Encounter (Signed)
LVM FOR PATIENT TO CALL BACK AND SCHEDULE APPT.

## 2019-06-26 DIAGNOSIS — I619 Nontraumatic intracerebral hemorrhage, unspecified: Secondary | ICD-10-CM | POA: Diagnosis not present

## 2019-07-04 DIAGNOSIS — R197 Diarrhea, unspecified: Secondary | ICD-10-CM | POA: Diagnosis not present

## 2019-07-08 DIAGNOSIS — Z1231 Encounter for screening mammogram for malignant neoplasm of breast: Secondary | ICD-10-CM | POA: Diagnosis not present

## 2019-07-25 DIAGNOSIS — G4733 Obstructive sleep apnea (adult) (pediatric): Secondary | ICD-10-CM | POA: Diagnosis not present

## 2019-09-30 DIAGNOSIS — J4520 Mild intermittent asthma, uncomplicated: Secondary | ICD-10-CM | POA: Diagnosis not present

## 2019-09-30 DIAGNOSIS — G4733 Obstructive sleep apnea (adult) (pediatric): Secondary | ICD-10-CM | POA: Diagnosis not present

## 2019-10-07 ENCOUNTER — Ambulatory Visit: Payer: Medicare Other

## 2019-11-01 DIAGNOSIS — G4733 Obstructive sleep apnea (adult) (pediatric): Secondary | ICD-10-CM | POA: Diagnosis not present

## 2019-11-04 DIAGNOSIS — L570 Actinic keratosis: Secondary | ICD-10-CM | POA: Diagnosis not present

## 2019-11-04 DIAGNOSIS — R233 Spontaneous ecchymoses: Secondary | ICD-10-CM | POA: Diagnosis not present

## 2019-11-04 DIAGNOSIS — L821 Other seborrheic keratosis: Secondary | ICD-10-CM | POA: Diagnosis not present

## 2019-11-04 DIAGNOSIS — C44629 Squamous cell carcinoma of skin of left upper limb, including shoulder: Secondary | ICD-10-CM | POA: Diagnosis not present

## 2019-11-05 DIAGNOSIS — R42 Dizziness and giddiness: Secondary | ICD-10-CM | POA: Diagnosis not present

## 2019-11-05 DIAGNOSIS — I10 Essential (primary) hypertension: Secondary | ICD-10-CM | POA: Diagnosis not present

## 2019-11-05 DIAGNOSIS — R110 Nausea: Secondary | ICD-10-CM | POA: Diagnosis not present

## 2019-11-07 DIAGNOSIS — M542 Cervicalgia: Secondary | ICD-10-CM | POA: Diagnosis not present

## 2019-11-07 DIAGNOSIS — M47812 Spondylosis without myelopathy or radiculopathy, cervical region: Secondary | ICD-10-CM | POA: Diagnosis not present

## 2019-11-07 DIAGNOSIS — M62838 Other muscle spasm: Secondary | ICD-10-CM | POA: Diagnosis not present

## 2019-11-07 DIAGNOSIS — M5030 Other cervical disc degeneration, unspecified cervical region: Secondary | ICD-10-CM | POA: Diagnosis not present

## 2019-11-10 DIAGNOSIS — I619 Nontraumatic intracerebral hemorrhage, unspecified: Secondary | ICD-10-CM | POA: Diagnosis not present

## 2019-11-10 DIAGNOSIS — I639 Cerebral infarction, unspecified: Secondary | ICD-10-CM | POA: Diagnosis not present

## 2019-11-24 DIAGNOSIS — R519 Headache, unspecified: Secondary | ICD-10-CM | POA: Diagnosis not present

## 2019-11-24 DIAGNOSIS — I639 Cerebral infarction, unspecified: Secondary | ICD-10-CM | POA: Diagnosis not present

## 2019-11-24 DIAGNOSIS — R42 Dizziness and giddiness: Secondary | ICD-10-CM | POA: Diagnosis not present

## 2020-01-20 DIAGNOSIS — I619 Nontraumatic intracerebral hemorrhage, unspecified: Secondary | ICD-10-CM | POA: Diagnosis not present

## 2020-01-26 DIAGNOSIS — H5203 Hypermetropia, bilateral: Secondary | ICD-10-CM | POA: Diagnosis not present

## 2020-01-26 DIAGNOSIS — H52223 Regular astigmatism, bilateral: Secondary | ICD-10-CM | POA: Diagnosis not present

## 2020-01-26 DIAGNOSIS — H35373 Puckering of macula, bilateral: Secondary | ICD-10-CM | POA: Diagnosis not present

## 2020-01-26 DIAGNOSIS — H04123 Dry eye syndrome of bilateral lacrimal glands: Secondary | ICD-10-CM | POA: Diagnosis not present

## 2020-01-30 DIAGNOSIS — G4733 Obstructive sleep apnea (adult) (pediatric): Secondary | ICD-10-CM | POA: Diagnosis not present

## 2020-02-03 DIAGNOSIS — L578 Other skin changes due to chronic exposure to nonionizing radiation: Secondary | ICD-10-CM | POA: Diagnosis not present

## 2020-02-03 DIAGNOSIS — L570 Actinic keratosis: Secondary | ICD-10-CM | POA: Diagnosis not present

## 2020-02-03 DIAGNOSIS — L728 Other follicular cysts of the skin and subcutaneous tissue: Secondary | ICD-10-CM | POA: Diagnosis not present

## 2020-02-11 DIAGNOSIS — H35371 Puckering of macula, right eye: Secondary | ICD-10-CM | POA: Diagnosis not present

## 2020-02-23 DIAGNOSIS — I619 Nontraumatic intracerebral hemorrhage, unspecified: Secondary | ICD-10-CM | POA: Diagnosis not present

## 2020-02-23 DIAGNOSIS — M810 Age-related osteoporosis without current pathological fracture: Secondary | ICD-10-CM | POA: Diagnosis not present

## 2020-02-23 DIAGNOSIS — I69359 Hemiplegia and hemiparesis following cerebral infarction affecting unspecified side: Secondary | ICD-10-CM | POA: Diagnosis not present

## 2020-02-23 DIAGNOSIS — K50919 Crohn's disease, unspecified, with unspecified complications: Secondary | ICD-10-CM | POA: Diagnosis not present

## 2020-02-23 DIAGNOSIS — Z0000 Encounter for general adult medical examination without abnormal findings: Secondary | ICD-10-CM | POA: Diagnosis not present

## 2020-02-23 DIAGNOSIS — F325 Major depressive disorder, single episode, in full remission: Secondary | ICD-10-CM | POA: Diagnosis not present

## 2020-02-23 DIAGNOSIS — I10 Essential (primary) hypertension: Secondary | ICD-10-CM | POA: Diagnosis not present

## 2020-02-26 DIAGNOSIS — G5762 Lesion of plantar nerve, left lower limb: Secondary | ICD-10-CM | POA: Diagnosis not present

## 2020-02-26 DIAGNOSIS — Q742 Other congenital malformations of lower limb(s), including pelvic girdle: Secondary | ICD-10-CM | POA: Diagnosis not present

## 2020-02-26 DIAGNOSIS — M19172 Post-traumatic osteoarthritis, left ankle and foot: Secondary | ICD-10-CM | POA: Diagnosis not present

## 2020-02-26 DIAGNOSIS — M25572 Pain in left ankle and joints of left foot: Secondary | ICD-10-CM | POA: Diagnosis not present

## 2020-02-26 DIAGNOSIS — M85872 Other specified disorders of bone density and structure, left ankle and foot: Secondary | ICD-10-CM | POA: Diagnosis not present

## 2020-02-26 DIAGNOSIS — Z8781 Personal history of (healed) traumatic fracture: Secondary | ICD-10-CM | POA: Diagnosis not present

## 2020-02-26 DIAGNOSIS — M19072 Primary osteoarthritis, left ankle and foot: Secondary | ICD-10-CM | POA: Diagnosis not present

## 2020-03-31 DIAGNOSIS — Q742 Other congenital malformations of lower limb(s), including pelvic girdle: Secondary | ICD-10-CM | POA: Diagnosis not present

## 2020-03-31 DIAGNOSIS — M19172 Post-traumatic osteoarthritis, left ankle and foot: Secondary | ICD-10-CM | POA: Diagnosis not present

## 2020-03-31 DIAGNOSIS — R600 Localized edema: Secondary | ICD-10-CM | POA: Diagnosis not present

## 2020-03-31 DIAGNOSIS — S8992XA Unspecified injury of left lower leg, initial encounter: Secondary | ICD-10-CM | POA: Diagnosis not present

## 2020-03-31 DIAGNOSIS — R200 Anesthesia of skin: Secondary | ICD-10-CM | POA: Diagnosis not present

## 2020-03-31 DIAGNOSIS — G5762 Lesion of plantar nerve, left lower limb: Secondary | ICD-10-CM | POA: Diagnosis not present

## 2020-03-31 DIAGNOSIS — M19072 Primary osteoarthritis, left ankle and foot: Secondary | ICD-10-CM | POA: Diagnosis not present

## 2020-03-31 DIAGNOSIS — M25572 Pain in left ankle and joints of left foot: Secondary | ICD-10-CM | POA: Diagnosis not present

## 2020-04-05 DIAGNOSIS — M19172 Post-traumatic osteoarthritis, left ankle and foot: Secondary | ICD-10-CM | POA: Diagnosis not present

## 2020-04-05 DIAGNOSIS — G8929 Other chronic pain: Secondary | ICD-10-CM | POA: Diagnosis not present

## 2020-04-05 DIAGNOSIS — M25572 Pain in left ankle and joints of left foot: Secondary | ICD-10-CM | POA: Diagnosis not present

## 2020-04-06 DIAGNOSIS — H04123 Dry eye syndrome of bilateral lacrimal glands: Secondary | ICD-10-CM | POA: Diagnosis not present

## 2020-04-06 DIAGNOSIS — H16103 Unspecified superficial keratitis, bilateral: Secondary | ICD-10-CM | POA: Diagnosis not present

## 2020-04-28 DIAGNOSIS — Z0120 Encounter for dental examination and cleaning without abnormal findings: Secondary | ICD-10-CM | POA: Diagnosis not present

## 2020-04-29 DIAGNOSIS — G4733 Obstructive sleep apnea (adult) (pediatric): Secondary | ICD-10-CM | POA: Diagnosis not present

## 2020-05-24 DIAGNOSIS — Z0120 Encounter for dental examination and cleaning without abnormal findings: Secondary | ICD-10-CM | POA: Diagnosis not present

## 2020-06-17 DIAGNOSIS — Z8673 Personal history of transient ischemic attack (TIA), and cerebral infarction without residual deficits: Secondary | ICD-10-CM | POA: Diagnosis not present

## 2020-06-17 DIAGNOSIS — R519 Headache, unspecified: Secondary | ICD-10-CM | POA: Diagnosis not present

## 2020-06-17 DIAGNOSIS — R419 Unspecified symptoms and signs involving cognitive functions and awareness: Secondary | ICD-10-CM | POA: Diagnosis not present

## 2020-07-02 DIAGNOSIS — G43009 Migraine without aura, not intractable, without status migrainosus: Secondary | ICD-10-CM | POA: Diagnosis not present

## 2020-07-02 DIAGNOSIS — I10 Essential (primary) hypertension: Secondary | ICD-10-CM | POA: Diagnosis not present

## 2020-07-02 DIAGNOSIS — M810 Age-related osteoporosis without current pathological fracture: Secondary | ICD-10-CM | POA: Diagnosis not present

## 2020-07-02 DIAGNOSIS — F5101 Primary insomnia: Secondary | ICD-10-CM | POA: Diagnosis not present

## 2020-07-27 DIAGNOSIS — Z0120 Encounter for dental examination and cleaning without abnormal findings: Secondary | ICD-10-CM | POA: Diagnosis not present

## 2020-08-07 DIAGNOSIS — G4733 Obstructive sleep apnea (adult) (pediatric): Secondary | ICD-10-CM | POA: Diagnosis not present

## 2020-08-09 DIAGNOSIS — Z1231 Encounter for screening mammogram for malignant neoplasm of breast: Secondary | ICD-10-CM | POA: Diagnosis not present

## 2020-10-21 DIAGNOSIS — I129 Hypertensive chronic kidney disease with stage 1 through stage 4 chronic kidney disease, or unspecified chronic kidney disease: Secondary | ICD-10-CM | POA: Diagnosis not present

## 2020-10-21 DIAGNOSIS — M810 Age-related osteoporosis without current pathological fracture: Secondary | ICD-10-CM | POA: Diagnosis not present

## 2020-10-21 DIAGNOSIS — F5101 Primary insomnia: Secondary | ICD-10-CM | POA: Diagnosis not present

## 2020-10-21 DIAGNOSIS — N1831 Chronic kidney disease, stage 3a: Secondary | ICD-10-CM | POA: Diagnosis not present

## 2020-11-05 DIAGNOSIS — G4733 Obstructive sleep apnea (adult) (pediatric): Secondary | ICD-10-CM | POA: Diagnosis not present

## 2020-12-28 DIAGNOSIS — Z23 Encounter for immunization: Secondary | ICD-10-CM | POA: Diagnosis not present

## 2021-02-03 DIAGNOSIS — G4733 Obstructive sleep apnea (adult) (pediatric): Secondary | ICD-10-CM | POA: Diagnosis not present

## 2021-02-04 DIAGNOSIS — H16103 Unspecified superficial keratitis, bilateral: Secondary | ICD-10-CM | POA: Diagnosis not present

## 2021-02-04 DIAGNOSIS — H5203 Hypermetropia, bilateral: Secondary | ICD-10-CM | POA: Diagnosis not present

## 2021-02-04 DIAGNOSIS — H04123 Dry eye syndrome of bilateral lacrimal glands: Secondary | ICD-10-CM | POA: Diagnosis not present

## 2021-02-04 DIAGNOSIS — H35371 Puckering of macula, right eye: Secondary | ICD-10-CM | POA: Diagnosis not present

## 2021-02-11 DIAGNOSIS — I10 Essential (primary) hypertension: Secondary | ICD-10-CM | POA: Diagnosis not present

## 2021-02-11 DIAGNOSIS — M25572 Pain in left ankle and joints of left foot: Secondary | ICD-10-CM | POA: Diagnosis not present

## 2021-02-11 DIAGNOSIS — M19172 Post-traumatic osteoarthritis, left ankle and foot: Secondary | ICD-10-CM | POA: Diagnosis not present

## 2021-02-11 DIAGNOSIS — G8929 Other chronic pain: Secondary | ICD-10-CM | POA: Diagnosis not present

## 2021-04-13 DIAGNOSIS — F325 Major depressive disorder, single episode, in full remission: Secondary | ICD-10-CM | POA: Diagnosis not present

## 2021-04-13 DIAGNOSIS — G4733 Obstructive sleep apnea (adult) (pediatric): Secondary | ICD-10-CM | POA: Diagnosis not present

## 2021-04-13 DIAGNOSIS — K50018 Crohn's disease of small intestine with other complication: Secondary | ICD-10-CM | POA: Diagnosis not present

## 2021-04-13 DIAGNOSIS — I10 Essential (primary) hypertension: Secondary | ICD-10-CM | POA: Diagnosis not present

## 2021-04-13 DIAGNOSIS — R5383 Other fatigue: Secondary | ICD-10-CM | POA: Diagnosis not present

## 2021-05-06 DIAGNOSIS — G4733 Obstructive sleep apnea (adult) (pediatric): Secondary | ICD-10-CM | POA: Diagnosis not present

## 2021-05-24 DIAGNOSIS — G4733 Obstructive sleep apnea (adult) (pediatric): Secondary | ICD-10-CM | POA: Diagnosis not present

## 2021-05-24 DIAGNOSIS — J4530 Mild persistent asthma, uncomplicated: Secondary | ICD-10-CM | POA: Diagnosis not present

## 2021-06-09 DIAGNOSIS — H35371 Puckering of macula, right eye: Secondary | ICD-10-CM | POA: Diagnosis not present

## 2021-06-20 DIAGNOSIS — K50818 Crohn's disease of both small and large intestine with other complication: Secondary | ICD-10-CM | POA: Diagnosis not present

## 2021-06-20 DIAGNOSIS — K629 Disease of anus and rectum, unspecified: Secondary | ICD-10-CM | POA: Diagnosis not present

## 2021-06-20 DIAGNOSIS — R197 Diarrhea, unspecified: Secondary | ICD-10-CM | POA: Diagnosis not present

## 2021-07-07 DIAGNOSIS — Z803 Family history of malignant neoplasm of breast: Secondary | ICD-10-CM | POA: Diagnosis not present

## 2021-07-07 DIAGNOSIS — I10 Essential (primary) hypertension: Secondary | ICD-10-CM | POA: Diagnosis not present

## 2021-07-07 DIAGNOSIS — Z87891 Personal history of nicotine dependence: Secondary | ICD-10-CM | POA: Diagnosis not present

## 2021-07-07 DIAGNOSIS — M810 Age-related osteoporosis without current pathological fracture: Secondary | ICD-10-CM | POA: Diagnosis not present

## 2021-07-07 DIAGNOSIS — E7800 Pure hypercholesterolemia, unspecified: Secondary | ICD-10-CM | POA: Diagnosis not present

## 2021-07-07 DIAGNOSIS — Z0000 Encounter for general adult medical examination without abnormal findings: Secondary | ICD-10-CM | POA: Diagnosis not present

## 2021-07-07 DIAGNOSIS — Z809 Family history of malignant neoplasm, unspecified: Secondary | ICD-10-CM | POA: Diagnosis not present

## 2021-07-07 DIAGNOSIS — Z7983 Long term (current) use of bisphosphonates: Secondary | ICD-10-CM | POA: Diagnosis not present

## 2021-07-07 DIAGNOSIS — R5382 Chronic fatigue, unspecified: Secondary | ICD-10-CM | POA: Diagnosis not present

## 2021-07-07 DIAGNOSIS — Z733 Stress, not elsewhere classified: Secondary | ICD-10-CM | POA: Diagnosis not present

## 2021-07-07 DIAGNOSIS — Z818 Family history of other mental and behavioral disorders: Secondary | ICD-10-CM | POA: Diagnosis not present

## 2021-07-07 DIAGNOSIS — Z0001 Encounter for general adult medical examination with abnormal findings: Secondary | ICD-10-CM | POA: Diagnosis not present

## 2021-07-07 DIAGNOSIS — Z79899 Other long term (current) drug therapy: Secondary | ICD-10-CM | POA: Diagnosis not present

## 2021-07-07 DIAGNOSIS — Z800 Family history of malignant neoplasm of digestive organs: Secondary | ICD-10-CM | POA: Diagnosis not present

## 2021-07-07 DIAGNOSIS — K50919 Crohn's disease, unspecified, with unspecified complications: Secondary | ICD-10-CM | POA: Diagnosis not present

## 2021-07-07 DIAGNOSIS — Z8261 Family history of arthritis: Secondary | ICD-10-CM | POA: Diagnosis not present

## 2021-07-07 DIAGNOSIS — Z9189 Other specified personal risk factors, not elsewhere classified: Secondary | ICD-10-CM | POA: Diagnosis not present

## 2021-07-07 DIAGNOSIS — Z23 Encounter for immunization: Secondary | ICD-10-CM | POA: Diagnosis not present

## 2021-07-19 DIAGNOSIS — Z961 Presence of intraocular lens: Secondary | ICD-10-CM | POA: Diagnosis not present

## 2021-07-19 DIAGNOSIS — H5203 Hypermetropia, bilateral: Secondary | ICD-10-CM | POA: Diagnosis not present

## 2021-07-19 DIAGNOSIS — H52223 Regular astigmatism, bilateral: Secondary | ICD-10-CM | POA: Diagnosis not present

## 2021-07-19 DIAGNOSIS — H16103 Unspecified superficial keratitis, bilateral: Secondary | ICD-10-CM | POA: Diagnosis not present

## 2021-07-20 DIAGNOSIS — M62830 Muscle spasm of back: Secondary | ICD-10-CM | POA: Diagnosis not present

## 2021-07-20 DIAGNOSIS — M5136 Other intervertebral disc degeneration, lumbar region: Secondary | ICD-10-CM | POA: Diagnosis not present

## 2021-07-20 DIAGNOSIS — M533 Sacrococcygeal disorders, not elsewhere classified: Secondary | ICD-10-CM | POA: Diagnosis not present

## 2021-08-02 DIAGNOSIS — G4733 Obstructive sleep apnea (adult) (pediatric): Secondary | ICD-10-CM | POA: Diagnosis not present

## 2021-08-10 DIAGNOSIS — R197 Diarrhea, unspecified: Secondary | ICD-10-CM | POA: Diagnosis not present

## 2021-08-10 DIAGNOSIS — K5080 Crohn's disease of both small and large intestine without complications: Secondary | ICD-10-CM | POA: Diagnosis not present

## 2021-08-10 DIAGNOSIS — K640 First degree hemorrhoids: Secondary | ICD-10-CM | POA: Diagnosis not present

## 2021-08-10 DIAGNOSIS — K5730 Diverticulosis of large intestine without perforation or abscess without bleeding: Secondary | ICD-10-CM | POA: Diagnosis not present

## 2021-08-10 DIAGNOSIS — K644 Residual hemorrhoidal skin tags: Secondary | ICD-10-CM | POA: Diagnosis not present

## 2021-08-15 DIAGNOSIS — M810 Age-related osteoporosis without current pathological fracture: Secondary | ICD-10-CM | POA: Diagnosis not present

## 2021-08-15 DIAGNOSIS — Z1231 Encounter for screening mammogram for malignant neoplasm of breast: Secondary | ICD-10-CM | POA: Diagnosis not present

## 2021-08-23 DIAGNOSIS — Z961 Presence of intraocular lens: Secondary | ICD-10-CM | POA: Diagnosis not present

## 2021-08-23 DIAGNOSIS — H04123 Dry eye syndrome of bilateral lacrimal glands: Secondary | ICD-10-CM | POA: Diagnosis not present

## 2021-08-23 DIAGNOSIS — H16103 Unspecified superficial keratitis, bilateral: Secondary | ICD-10-CM | POA: Diagnosis not present

## 2021-08-31 DIAGNOSIS — R928 Other abnormal and inconclusive findings on diagnostic imaging of breast: Secondary | ICD-10-CM | POA: Diagnosis not present

## 2021-08-31 DIAGNOSIS — R922 Inconclusive mammogram: Secondary | ICD-10-CM | POA: Diagnosis not present

## 2021-09-01 DIAGNOSIS — G4733 Obstructive sleep apnea (adult) (pediatric): Secondary | ICD-10-CM | POA: Diagnosis not present

## 2021-09-20 DIAGNOSIS — I10 Essential (primary) hypertension: Secondary | ICD-10-CM | POA: Diagnosis not present

## 2021-09-20 DIAGNOSIS — I714 Abdominal aortic aneurysm, without rupture, unspecified: Secondary | ICD-10-CM | POA: Diagnosis not present

## 2021-09-20 DIAGNOSIS — Z136 Encounter for screening for cardiovascular disorders: Secondary | ICD-10-CM | POA: Diagnosis not present

## 2021-09-27 DIAGNOSIS — E782 Mixed hyperlipidemia: Secondary | ICD-10-CM | POA: Diagnosis not present

## 2021-09-27 DIAGNOSIS — M79645 Pain in left finger(s): Secondary | ICD-10-CM | POA: Diagnosis not present

## 2021-10-02 DIAGNOSIS — G4733 Obstructive sleep apnea (adult) (pediatric): Secondary | ICD-10-CM | POA: Diagnosis not present

## 2021-10-04 DIAGNOSIS — E782 Mixed hyperlipidemia: Secondary | ICD-10-CM | POA: Diagnosis not present

## 2021-11-09 DIAGNOSIS — G4733 Obstructive sleep apnea (adult) (pediatric): Secondary | ICD-10-CM | POA: Diagnosis not present

## 2021-12-10 DIAGNOSIS — G4733 Obstructive sleep apnea (adult) (pediatric): Secondary | ICD-10-CM | POA: Diagnosis not present

## 2021-12-14 DIAGNOSIS — M62830 Muscle spasm of back: Secondary | ICD-10-CM | POA: Diagnosis not present

## 2021-12-14 DIAGNOSIS — M545 Low back pain, unspecified: Secondary | ICD-10-CM | POA: Diagnosis not present

## 2021-12-14 DIAGNOSIS — M533 Sacrococcygeal disorders, not elsewhere classified: Secondary | ICD-10-CM | POA: Diagnosis not present

## 2021-12-14 DIAGNOSIS — M5136 Other intervertebral disc degeneration, lumbar region: Secondary | ICD-10-CM | POA: Diagnosis not present

## 2022-01-05 DIAGNOSIS — M533 Sacrococcygeal disorders, not elsewhere classified: Secondary | ICD-10-CM | POA: Diagnosis not present

## 2022-01-05 DIAGNOSIS — M5136 Other intervertebral disc degeneration, lumbar region: Secondary | ICD-10-CM | POA: Diagnosis not present

## 2022-01-05 DIAGNOSIS — M62830 Muscle spasm of back: Secondary | ICD-10-CM | POA: Diagnosis not present

## 2022-01-09 DIAGNOSIS — G4733 Obstructive sleep apnea (adult) (pediatric): Secondary | ICD-10-CM | POA: Diagnosis not present

## 2022-01-10 DIAGNOSIS — M62830 Muscle spasm of back: Secondary | ICD-10-CM | POA: Diagnosis not present

## 2022-01-10 DIAGNOSIS — M5136 Other intervertebral disc degeneration, lumbar region: Secondary | ICD-10-CM | POA: Diagnosis not present

## 2022-01-10 DIAGNOSIS — M533 Sacrococcygeal disorders, not elsewhere classified: Secondary | ICD-10-CM | POA: Diagnosis not present

## 2022-01-12 DIAGNOSIS — M545 Low back pain, unspecified: Secondary | ICD-10-CM | POA: Diagnosis not present

## 2022-01-12 DIAGNOSIS — R29898 Other symptoms and signs involving the musculoskeletal system: Secondary | ICD-10-CM | POA: Diagnosis not present

## 2022-01-16 DIAGNOSIS — L570 Actinic keratosis: Secondary | ICD-10-CM | POA: Diagnosis not present

## 2022-01-16 DIAGNOSIS — L578 Other skin changes due to chronic exposure to nonionizing radiation: Secondary | ICD-10-CM | POA: Diagnosis not present

## 2022-01-16 DIAGNOSIS — R233 Spontaneous ecchymoses: Secondary | ICD-10-CM | POA: Diagnosis not present

## 2022-01-16 DIAGNOSIS — L299 Pruritus, unspecified: Secondary | ICD-10-CM | POA: Diagnosis not present

## 2022-01-20 DIAGNOSIS — R29898 Other symptoms and signs involving the musculoskeletal system: Secondary | ICD-10-CM | POA: Diagnosis not present

## 2022-01-20 DIAGNOSIS — M545 Low back pain, unspecified: Secondary | ICD-10-CM | POA: Diagnosis not present

## 2022-01-24 DIAGNOSIS — R29898 Other symptoms and signs involving the musculoskeletal system: Secondary | ICD-10-CM | POA: Diagnosis not present

## 2022-01-24 DIAGNOSIS — M545 Low back pain, unspecified: Secondary | ICD-10-CM | POA: Diagnosis not present

## 2022-01-25 DIAGNOSIS — I10 Essential (primary) hypertension: Secondary | ICD-10-CM | POA: Diagnosis not present

## 2022-01-25 DIAGNOSIS — M5136 Other intervertebral disc degeneration, lumbar region: Secondary | ICD-10-CM | POA: Diagnosis not present

## 2022-01-25 DIAGNOSIS — M533 Sacrococcygeal disorders, not elsewhere classified: Secondary | ICD-10-CM | POA: Diagnosis not present

## 2022-01-25 DIAGNOSIS — M19172 Post-traumatic osteoarthritis, left ankle and foot: Secondary | ICD-10-CM | POA: Diagnosis not present

## 2022-02-01 DIAGNOSIS — M545 Low back pain, unspecified: Secondary | ICD-10-CM | POA: Diagnosis not present

## 2022-02-01 DIAGNOSIS — R29898 Other symptoms and signs involving the musculoskeletal system: Secondary | ICD-10-CM | POA: Diagnosis not present

## 2022-02-03 DIAGNOSIS — R29898 Other symptoms and signs involving the musculoskeletal system: Secondary | ICD-10-CM | POA: Diagnosis not present

## 2022-02-03 DIAGNOSIS — M545 Low back pain, unspecified: Secondary | ICD-10-CM | POA: Diagnosis not present

## 2022-02-07 DIAGNOSIS — I129 Hypertensive chronic kidney disease with stage 1 through stage 4 chronic kidney disease, or unspecified chronic kidney disease: Secondary | ICD-10-CM | POA: Diagnosis not present

## 2022-02-07 DIAGNOSIS — J4520 Mild intermittent asthma, uncomplicated: Secondary | ICD-10-CM | POA: Diagnosis not present

## 2022-02-07 DIAGNOSIS — G43009 Migraine without aura, not intractable, without status migrainosus: Secondary | ICD-10-CM | POA: Diagnosis not present

## 2022-02-07 DIAGNOSIS — N1832 Chronic kidney disease, stage 3b: Secondary | ICD-10-CM | POA: Diagnosis not present

## 2022-03-01 DIAGNOSIS — R42 Dizziness and giddiness: Secondary | ICD-10-CM | POA: Diagnosis not present

## 2022-03-01 DIAGNOSIS — R5383 Other fatigue: Secondary | ICD-10-CM | POA: Diagnosis not present

## 2022-03-06 DIAGNOSIS — G4733 Obstructive sleep apnea (adult) (pediatric): Secondary | ICD-10-CM | POA: Diagnosis not present

## 2022-03-20 DIAGNOSIS — I10 Essential (primary) hypertension: Secondary | ICD-10-CM | POA: Diagnosis not present

## 2022-03-20 DIAGNOSIS — G4452 New daily persistent headache (NDPH): Secondary | ICD-10-CM | POA: Diagnosis not present

## 2022-03-20 DIAGNOSIS — G43009 Migraine without aura, not intractable, without status migrainosus: Secondary | ICD-10-CM | POA: Diagnosis not present

## 2022-03-23 DIAGNOSIS — H16223 Keratoconjunctivitis sicca, not specified as Sjogren's, bilateral: Secondary | ICD-10-CM | POA: Diagnosis not present

## 2022-03-23 DIAGNOSIS — H16103 Unspecified superficial keratitis, bilateral: Secondary | ICD-10-CM | POA: Diagnosis not present

## 2022-03-23 DIAGNOSIS — Z961 Presence of intraocular lens: Secondary | ICD-10-CM | POA: Diagnosis not present

## 2022-03-23 DIAGNOSIS — H02831 Dermatochalasis of right upper eyelid: Secondary | ICD-10-CM | POA: Diagnosis not present

## 2022-04-03 DIAGNOSIS — I10 Essential (primary) hypertension: Secondary | ICD-10-CM | POA: Diagnosis not present

## 2022-04-05 DIAGNOSIS — G4733 Obstructive sleep apnea (adult) (pediatric): Secondary | ICD-10-CM | POA: Diagnosis not present

## 2022-04-19 ENCOUNTER — Other Ambulatory Visit: Payer: Self-pay | Admitting: *Deleted

## 2022-04-19 NOTE — Patient Outreach (Signed)
  Care Coordination   04/19/2022 Name: Julia [name] MRN: [id number] DOB: 07-Oct-1950   Care Coordination Outreach Attempts:  An unsuccessful telephone outreach was attempted today to offer the patient information about available care coordination services as a benefit of their health plan.   Follow Up Plan:  Additional outreach attempts will be made to offer the patient care coordination information and services.   Encounter Outcome:  No Answer  Care Coordination Interventions Activated:  No   Care Coordination Interventions:  No, not indicated    Raina Mina, RN Care Management Coordinator [place] Office [phone]

## 2022-04-25 DIAGNOSIS — H16103 Unspecified superficial keratitis, bilateral: Secondary | ICD-10-CM | POA: Diagnosis not present

## 2022-04-25 DIAGNOSIS — Z961 Presence of intraocular lens: Secondary | ICD-10-CM | POA: Diagnosis not present

## 2022-05-06 DIAGNOSIS — G4733 Obstructive sleep apnea (adult) (pediatric): Secondary | ICD-10-CM | POA: Diagnosis not present

## 2022-05-24 DIAGNOSIS — R059 Cough, unspecified: Secondary | ICD-10-CM | POA: Diagnosis not present

## 2022-05-24 DIAGNOSIS — J9811 Atelectasis: Secondary | ICD-10-CM | POA: Diagnosis not present

## 2022-05-24 DIAGNOSIS — R051 Acute cough: Secondary | ICD-10-CM | POA: Diagnosis not present

## 2022-05-24 NOTE — Patient Outreach (Signed)
Error

## 2022-06-02 DIAGNOSIS — D225 Melanocytic nevi of trunk: Secondary | ICD-10-CM | POA: Diagnosis not present

## 2022-06-02 DIAGNOSIS — L578 Other skin changes due to chronic exposure to nonionizing radiation: Secondary | ICD-10-CM | POA: Diagnosis not present

## 2022-06-02 DIAGNOSIS — L820 Inflamed seborrheic keratosis: Secondary | ICD-10-CM | POA: Diagnosis not present

## 2022-06-07 DIAGNOSIS — G4733 Obstructive sleep apnea (adult) (pediatric): Secondary | ICD-10-CM | POA: Diagnosis not present

## 2022-06-12 DIAGNOSIS — G4733 Obstructive sleep apnea (adult) (pediatric): Secondary | ICD-10-CM | POA: Diagnosis not present

## 2022-06-12 DIAGNOSIS — J4530 Mild persistent asthma, uncomplicated: Secondary | ICD-10-CM | POA: Diagnosis not present

## 2022-07-07 DIAGNOSIS — G4733 Obstructive sleep apnea (adult) (pediatric): Secondary | ICD-10-CM | POA: Diagnosis not present

## 2022-07-19 DIAGNOSIS — I10 Essential (primary) hypertension: Secondary | ICD-10-CM | POA: Diagnosis not present

## 2022-07-19 DIAGNOSIS — L03116 Cellulitis of left lower limb: Secondary | ICD-10-CM | POA: Diagnosis not present

## 2022-07-19 DIAGNOSIS — G43009 Migraine without aura, not intractable, without status migrainosus: Secondary | ICD-10-CM | POA: Diagnosis not present

## 2022-07-19 DIAGNOSIS — I671 Cerebral aneurysm, nonruptured: Secondary | ICD-10-CM | POA: Diagnosis not present

## 2022-07-19 DIAGNOSIS — Z0000 Encounter for general adult medical examination without abnormal findings: Secondary | ICD-10-CM | POA: Diagnosis not present

## 2022-07-19 DIAGNOSIS — Z23 Encounter for immunization: Secondary | ICD-10-CM | POA: Diagnosis not present

## 2022-07-31 DIAGNOSIS — I10 Essential (primary) hypertension: Secondary | ICD-10-CM | POA: Diagnosis not present

## 2022-07-31 DIAGNOSIS — M19172 Post-traumatic osteoarthritis, left ankle and foot: Secondary | ICD-10-CM | POA: Diagnosis not present

## 2022-08-07 DIAGNOSIS — H02831 Dermatochalasis of right upper eyelid: Secondary | ICD-10-CM | POA: Diagnosis not present

## 2022-08-07 DIAGNOSIS — H04123 Dry eye syndrome of bilateral lacrimal glands: Secondary | ICD-10-CM | POA: Diagnosis not present

## 2022-08-07 DIAGNOSIS — G4733 Obstructive sleep apnea (adult) (pediatric): Secondary | ICD-10-CM | POA: Diagnosis not present

## 2022-08-07 DIAGNOSIS — H35371 Puckering of macula, right eye: Secondary | ICD-10-CM | POA: Diagnosis not present

## 2022-08-07 DIAGNOSIS — H02834 Dermatochalasis of left upper eyelid: Secondary | ICD-10-CM | POA: Diagnosis not present

## 2022-08-23 ENCOUNTER — Telehealth: Payer: Self-pay | Admitting: *Deleted

## 2022-08-23 NOTE — Patient Outreach (Signed)
  Care Coordination   08/23/2022 Name: KYARAH ENAMORADO MRN: [id number] DOB: 27-Aug-1951   Care Coordination Outreach Attempts:  A second unsuccessful outreach was attempted today to offer the patient with information about available care coordination services as a benefit of their health plan.     Follow Up Plan:  Additional outreach attempts will be made to offer the patient care coordination information and services.   Encounter Outcome:  No Answer   Care Coordination Interventions:  No, not indicated    Raina Mina, RN Care Management Coordinator [place] Office [phone]

## 2022-09-18 DIAGNOSIS — G4733 Obstructive sleep apnea (adult) (pediatric): Secondary | ICD-10-CM | POA: Diagnosis not present

## 2022-09-26 DIAGNOSIS — R21 Rash and other nonspecific skin eruption: Secondary | ICD-10-CM | POA: Diagnosis not present

## 2022-10-09 DIAGNOSIS — R600 Localized edema: Secondary | ICD-10-CM | POA: Diagnosis not present

## 2022-10-09 DIAGNOSIS — I872 Venous insufficiency (chronic) (peripheral): Secondary | ICD-10-CM | POA: Diagnosis not present

## 2022-10-18 DIAGNOSIS — H04123 Dry eye syndrome of bilateral lacrimal glands: Secondary | ICD-10-CM | POA: Diagnosis not present

## 2022-10-18 DIAGNOSIS — H35373 Puckering of macula, bilateral: Secondary | ICD-10-CM | POA: Diagnosis not present

## 2022-11-01 DIAGNOSIS — Z1231 Encounter for screening mammogram for malignant neoplasm of breast: Secondary | ICD-10-CM | POA: Diagnosis not present

## 2022-12-05 DIAGNOSIS — R112 Nausea with vomiting, unspecified: Secondary | ICD-10-CM | POA: Diagnosis not present

## 2022-12-05 DIAGNOSIS — K50819 Crohn's disease of both small and large intestine with unspecified complications: Secondary | ICD-10-CM | POA: Diagnosis not present

## 2022-12-05 DIAGNOSIS — R1084 Generalized abdominal pain: Secondary | ICD-10-CM | POA: Diagnosis not present

## 2022-12-18 DIAGNOSIS — G4733 Obstructive sleep apnea (adult) (pediatric): Secondary | ICD-10-CM | POA: Diagnosis not present

## 2023-01-10 DIAGNOSIS — N183 Chronic kidney disease, stage 3 unspecified: Secondary | ICD-10-CM | POA: Diagnosis not present

## 2023-01-10 DIAGNOSIS — M19172 Post-traumatic osteoarthritis, left ankle and foot: Secondary | ICD-10-CM | POA: Diagnosis not present

## 2023-01-10 DIAGNOSIS — I10 Essential (primary) hypertension: Secondary | ICD-10-CM | POA: Diagnosis not present

## 2023-01-10 DIAGNOSIS — M25572 Pain in left ankle and joints of left foot: Secondary | ICD-10-CM | POA: Diagnosis not present

## 2023-01-12 DIAGNOSIS — I10 Essential (primary) hypertension: Secondary | ICD-10-CM | POA: Diagnosis not present

## 2023-01-12 DIAGNOSIS — Z13220 Encounter for screening for lipoid disorders: Secondary | ICD-10-CM | POA: Diagnosis not present

## 2023-01-17 DIAGNOSIS — Z0000 Encounter for general adult medical examination without abnormal findings: Secondary | ICD-10-CM | POA: Diagnosis not present

## 2023-01-17 DIAGNOSIS — I10 Essential (primary) hypertension: Secondary | ICD-10-CM | POA: Diagnosis not present

## 2023-01-17 DIAGNOSIS — G4733 Obstructive sleep apnea (adult) (pediatric): Secondary | ICD-10-CM | POA: Diagnosis not present

## 2023-01-17 DIAGNOSIS — J4520 Mild intermittent asthma, uncomplicated: Secondary | ICD-10-CM | POA: Diagnosis not present

## 2023-01-17 DIAGNOSIS — G43109 Migraine with aura, not intractable, without status migrainosus: Secondary | ICD-10-CM | POA: Diagnosis not present

## 2023-01-22 DIAGNOSIS — C44622 Squamous cell carcinoma of skin of right upper limb, including shoulder: Secondary | ICD-10-CM | POA: Diagnosis not present

## 2023-01-22 DIAGNOSIS — L821 Other seborrheic keratosis: Secondary | ICD-10-CM | POA: Diagnosis not present

## 2023-01-22 DIAGNOSIS — L700 Acne vulgaris: Secondary | ICD-10-CM | POA: Diagnosis not present

## 2023-01-22 DIAGNOSIS — L570 Actinic keratosis: Secondary | ICD-10-CM | POA: Diagnosis not present

## 2023-03-12 DIAGNOSIS — M545 Low back pain, unspecified: Secondary | ICD-10-CM | POA: Diagnosis not present

## 2023-03-12 DIAGNOSIS — M47818 Spondylosis without myelopathy or radiculopathy, sacral and sacrococcygeal region: Secondary | ICD-10-CM | POA: Diagnosis not present

## 2023-03-12 DIAGNOSIS — G8929 Other chronic pain: Secondary | ICD-10-CM | POA: Diagnosis not present

## 2023-03-12 DIAGNOSIS — M47816 Spondylosis without myelopathy or radiculopathy, lumbar region: Secondary | ICD-10-CM | POA: Diagnosis not present

## 2023-03-12 DIAGNOSIS — M5136 Other intervertebral disc degeneration, lumbar region: Secondary | ICD-10-CM | POA: Diagnosis not present

## 2023-03-19 DIAGNOSIS — G4733 Obstructive sleep apnea (adult) (pediatric): Secondary | ICD-10-CM | POA: Diagnosis not present

## 2023-03-26 DIAGNOSIS — Z961 Presence of intraocular lens: Secondary | ICD-10-CM | POA: Diagnosis not present

## 2023-03-26 DIAGNOSIS — H35371 Puckering of macula, right eye: Secondary | ICD-10-CM | POA: Diagnosis not present

## 2023-04-19 DIAGNOSIS — G4733 Obstructive sleep apnea (adult) (pediatric): Secondary | ICD-10-CM | POA: Diagnosis not present

## 2023-05-20 DIAGNOSIS — G4733 Obstructive sleep apnea (adult) (pediatric): Secondary | ICD-10-CM | POA: Diagnosis not present

## 2023-06-14 DIAGNOSIS — G4733 Obstructive sleep apnea (adult) (pediatric): Secondary | ICD-10-CM | POA: Diagnosis not present

## 2023-06-14 DIAGNOSIS — J069 Acute upper respiratory infection, unspecified: Secondary | ICD-10-CM | POA: Diagnosis not present

## 2023-06-14 DIAGNOSIS — J4530 Mild persistent asthma, uncomplicated: Secondary | ICD-10-CM | POA: Diagnosis not present

## 2023-06-18 DIAGNOSIS — G4733 Obstructive sleep apnea (adult) (pediatric): Secondary | ICD-10-CM | POA: Diagnosis not present

## 2023-06-20 DIAGNOSIS — G4733 Obstructive sleep apnea (adult) (pediatric): Secondary | ICD-10-CM | POA: Diagnosis not present

## 2023-07-19 DIAGNOSIS — G4733 Obstructive sleep apnea (adult) (pediatric): Secondary | ICD-10-CM | POA: Diagnosis not present

## 2023-07-20 DIAGNOSIS — Z13220 Encounter for screening for lipoid disorders: Secondary | ICD-10-CM | POA: Diagnosis not present

## 2023-07-20 DIAGNOSIS — I10 Essential (primary) hypertension: Secondary | ICD-10-CM | POA: Diagnosis not present

## 2023-07-25 DIAGNOSIS — G43009 Migraine without aura, not intractable, without status migrainosus: Secondary | ICD-10-CM | POA: Diagnosis not present

## 2023-07-25 DIAGNOSIS — Z0000 Encounter for general adult medical examination without abnormal findings: Secondary | ICD-10-CM | POA: Diagnosis not present

## 2023-07-25 DIAGNOSIS — J4520 Mild intermittent asthma, uncomplicated: Secondary | ICD-10-CM | POA: Diagnosis not present

## 2023-07-25 DIAGNOSIS — N183 Chronic kidney disease, stage 3 unspecified: Secondary | ICD-10-CM | POA: Diagnosis not present

## 2023-07-25 DIAGNOSIS — R0609 Other forms of dyspnea: Secondary | ICD-10-CM | POA: Diagnosis not present

## 2023-08-06 DIAGNOSIS — M25572 Pain in left ankle and joints of left foot: Secondary | ICD-10-CM | POA: Diagnosis not present

## 2023-08-06 DIAGNOSIS — N183 Chronic kidney disease, stage 3 unspecified: Secondary | ICD-10-CM | POA: Diagnosis not present

## 2023-08-06 DIAGNOSIS — M19172 Post-traumatic osteoarthritis, left ankle and foot: Secondary | ICD-10-CM | POA: Diagnosis not present

## 2023-08-18 DIAGNOSIS — G4733 Obstructive sleep apnea (adult) (pediatric): Secondary | ICD-10-CM | POA: Diagnosis not present

## 2023-08-20 DIAGNOSIS — M810 Age-related osteoporosis without current pathological fracture: Secondary | ICD-10-CM | POA: Diagnosis not present

## 2023-08-20 DIAGNOSIS — Z780 Asymptomatic menopausal state: Secondary | ICD-10-CM | POA: Diagnosis not present
